# Patient Record
Sex: Female | Born: 1980 | Race: Black or African American | Hispanic: No | Marital: Married | Smoking: Never smoker
Health system: Southern US, Community
[De-identification: ages and names within clinical notes are randomized; demographics above are authoritative.]

## PROBLEM LIST (undated history)

## (undated) DIAGNOSIS — K219 Gastro-esophageal reflux disease without esophagitis: Secondary | ICD-10-CM

## (undated) DIAGNOSIS — M069 Rheumatoid arthritis, unspecified: Secondary | ICD-10-CM

## (undated) DIAGNOSIS — E049 Nontoxic goiter, unspecified: Secondary | ICD-10-CM

## (undated) DIAGNOSIS — I10 Essential (primary) hypertension: Secondary | ICD-10-CM

## (undated) DIAGNOSIS — N62 Hypertrophy of breast: Secondary | ICD-10-CM

## (undated) DIAGNOSIS — G4730 Sleep apnea, unspecified: Secondary | ICD-10-CM

## (undated) DIAGNOSIS — R42 Dizziness and giddiness: Secondary | ICD-10-CM

## (undated) HISTORY — DX: Dizziness and giddiness: R42

## (undated) HISTORY — DX: Gastro-esophageal reflux disease without esophagitis: K21.9

## (undated) HISTORY — DX: Rheumatoid arthritis, unspecified: M06.9

## (undated) SURGERY — BREAST REDUCTION WITH LIPOSUCTION
Anesthesia: General | Laterality: Bilateral

---

## 2002-10-21 ENCOUNTER — Encounter: Admission: RE | Admit: 2002-10-21 | Discharge: 2002-10-21 | Payer: Self-pay | Admitting: *Deleted

## 2002-10-21 ENCOUNTER — Encounter (INDEPENDENT_AMBULATORY_CARE_PROVIDER_SITE_OTHER): Payer: Self-pay

## 2002-10-21 ENCOUNTER — Other Ambulatory Visit: Admission: RE | Admit: 2002-10-21 | Discharge: 2002-10-21 | Payer: Self-pay | Admitting: *Deleted

## 2002-12-16 ENCOUNTER — Encounter: Admission: RE | Admit: 2002-12-16 | Discharge: 2002-12-16 | Payer: Self-pay | Admitting: Obstetrics and Gynecology

## 2003-04-06 ENCOUNTER — Encounter (INDEPENDENT_AMBULATORY_CARE_PROVIDER_SITE_OTHER): Payer: Self-pay | Admitting: *Deleted

## 2003-04-06 ENCOUNTER — Encounter: Admission: RE | Admit: 2003-04-06 | Discharge: 2003-04-06 | Payer: Self-pay | Admitting: Obstetrics and Gynecology

## 2003-08-03 ENCOUNTER — Encounter: Admission: RE | Admit: 2003-08-03 | Discharge: 2003-08-03 | Payer: Self-pay | Admitting: Obstetrics and Gynecology

## 2003-08-03 ENCOUNTER — Encounter (INDEPENDENT_AMBULATORY_CARE_PROVIDER_SITE_OTHER): Payer: Self-pay | Admitting: Specialist

## 2003-12-20 ENCOUNTER — Encounter: Admission: RE | Admit: 2003-12-20 | Discharge: 2003-12-20 | Payer: Self-pay | Admitting: Internal Medicine

## 2003-12-22 ENCOUNTER — Encounter: Admission: RE | Admit: 2003-12-22 | Discharge: 2003-12-22 | Payer: Self-pay | Admitting: Family Medicine

## 2004-02-08 ENCOUNTER — Encounter: Admission: RE | Admit: 2004-02-08 | Discharge: 2004-02-08 | Payer: Self-pay | Admitting: Obstetrics and Gynecology

## 2004-02-08 ENCOUNTER — Encounter (INDEPENDENT_AMBULATORY_CARE_PROVIDER_SITE_OTHER): Payer: Self-pay | Admitting: *Deleted

## 2004-09-24 ENCOUNTER — Encounter (INDEPENDENT_AMBULATORY_CARE_PROVIDER_SITE_OTHER): Payer: Self-pay | Admitting: *Deleted

## 2004-09-24 ENCOUNTER — Ambulatory Visit: Payer: Self-pay | Admitting: Obstetrics & Gynecology

## 2004-10-02 ENCOUNTER — Encounter: Admission: RE | Admit: 2004-10-02 | Discharge: 2004-10-02 | Payer: Self-pay | Admitting: Internal Medicine

## 2005-02-25 ENCOUNTER — Encounter (INDEPENDENT_AMBULATORY_CARE_PROVIDER_SITE_OTHER): Payer: Self-pay | Admitting: *Deleted

## 2005-02-25 ENCOUNTER — Ambulatory Visit: Payer: Self-pay | Admitting: Obstetrics & Gynecology

## 2005-10-01 ENCOUNTER — Encounter: Admission: RE | Admit: 2005-10-01 | Discharge: 2005-10-01 | Payer: Self-pay | Admitting: Rheumatology

## 2005-12-10 ENCOUNTER — Ambulatory Visit: Payer: Self-pay | Admitting: Obstetrics & Gynecology

## 2005-12-10 ENCOUNTER — Encounter (INDEPENDENT_AMBULATORY_CARE_PROVIDER_SITE_OTHER): Payer: Self-pay | Admitting: Specialist

## 2006-05-20 ENCOUNTER — Ambulatory Visit: Payer: Self-pay | Admitting: Obstetrics & Gynecology

## 2006-05-20 ENCOUNTER — Encounter (INDEPENDENT_AMBULATORY_CARE_PROVIDER_SITE_OTHER): Payer: Self-pay | Admitting: *Deleted

## 2006-06-04 ENCOUNTER — Ambulatory Visit: Payer: Self-pay | Admitting: Obstetrics & Gynecology

## 2006-06-10 IMAGING — CR DG CHEST 2V
2 series · 2 of 2 positions shown · non-contrast
Comparison: none

CLINICAL DATA: Cough.  Evaluate for possible sarcoidosis. 
 CHEST ? TWO VIEWS:

[view not recorded (1 of 2)]
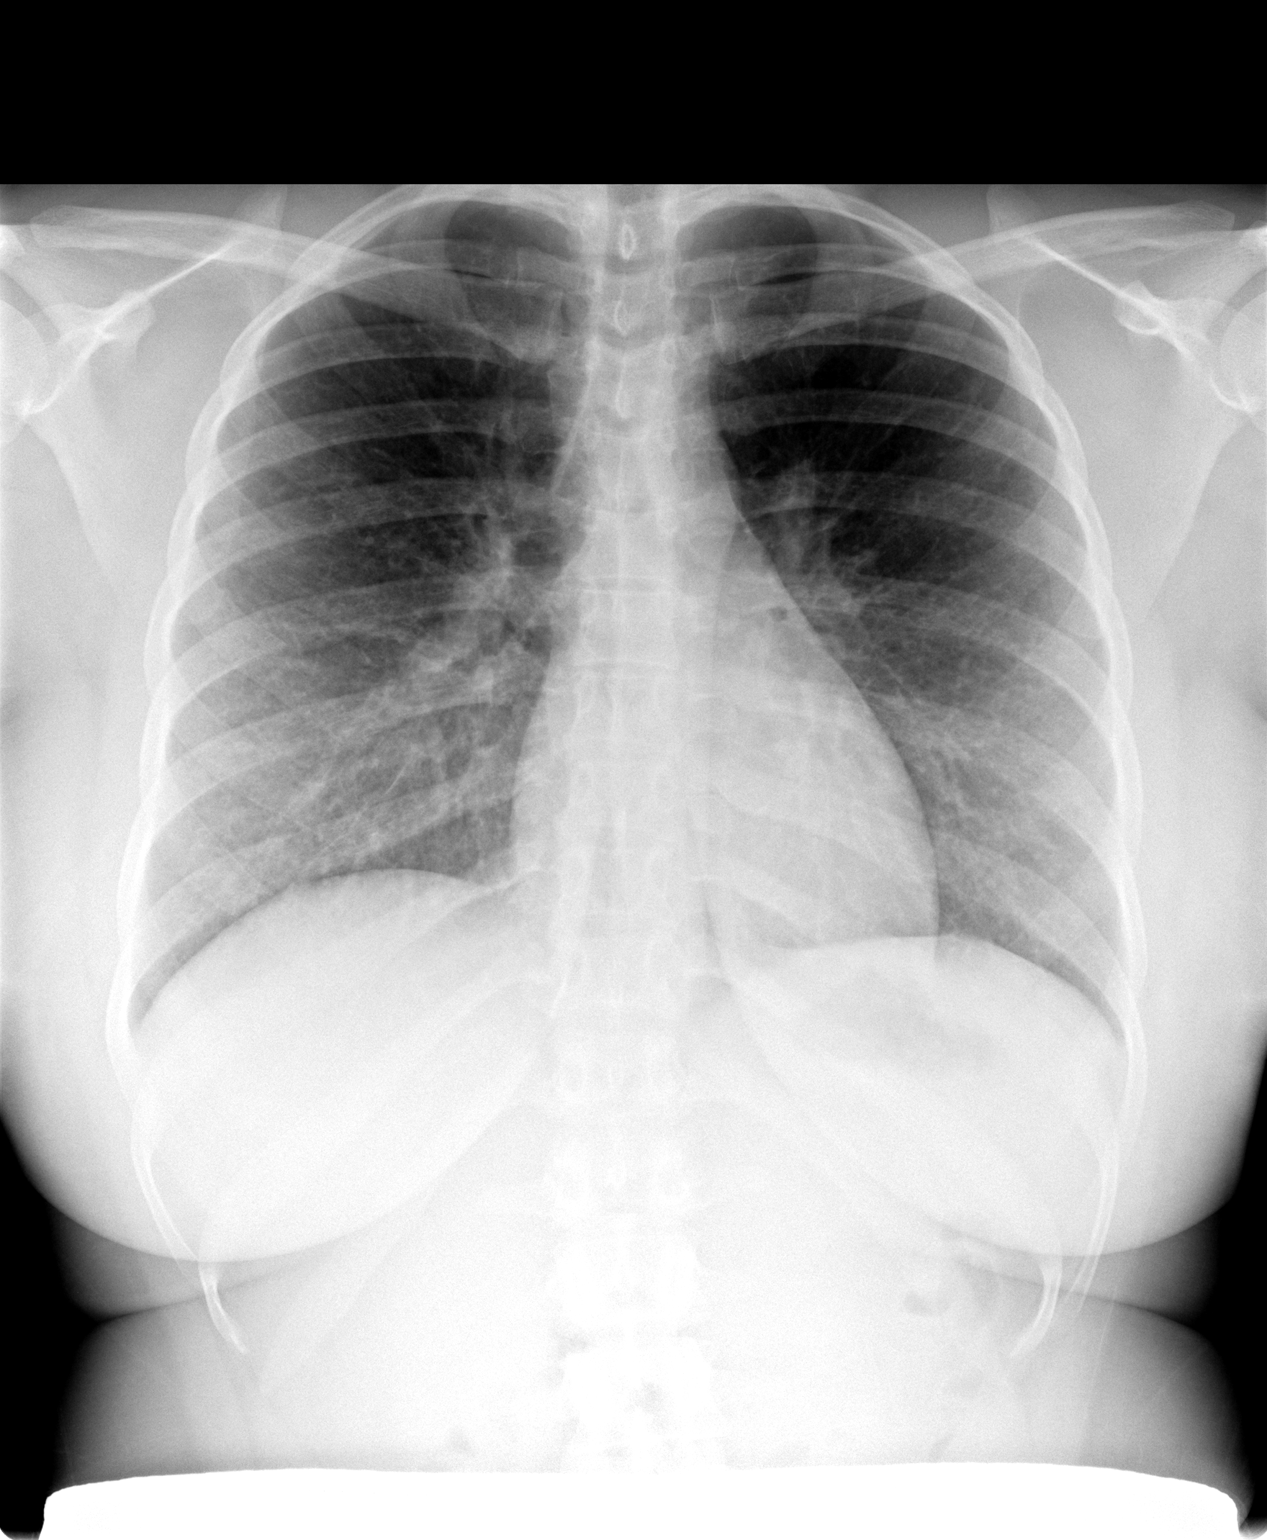

[view not recorded (2 of 2)]
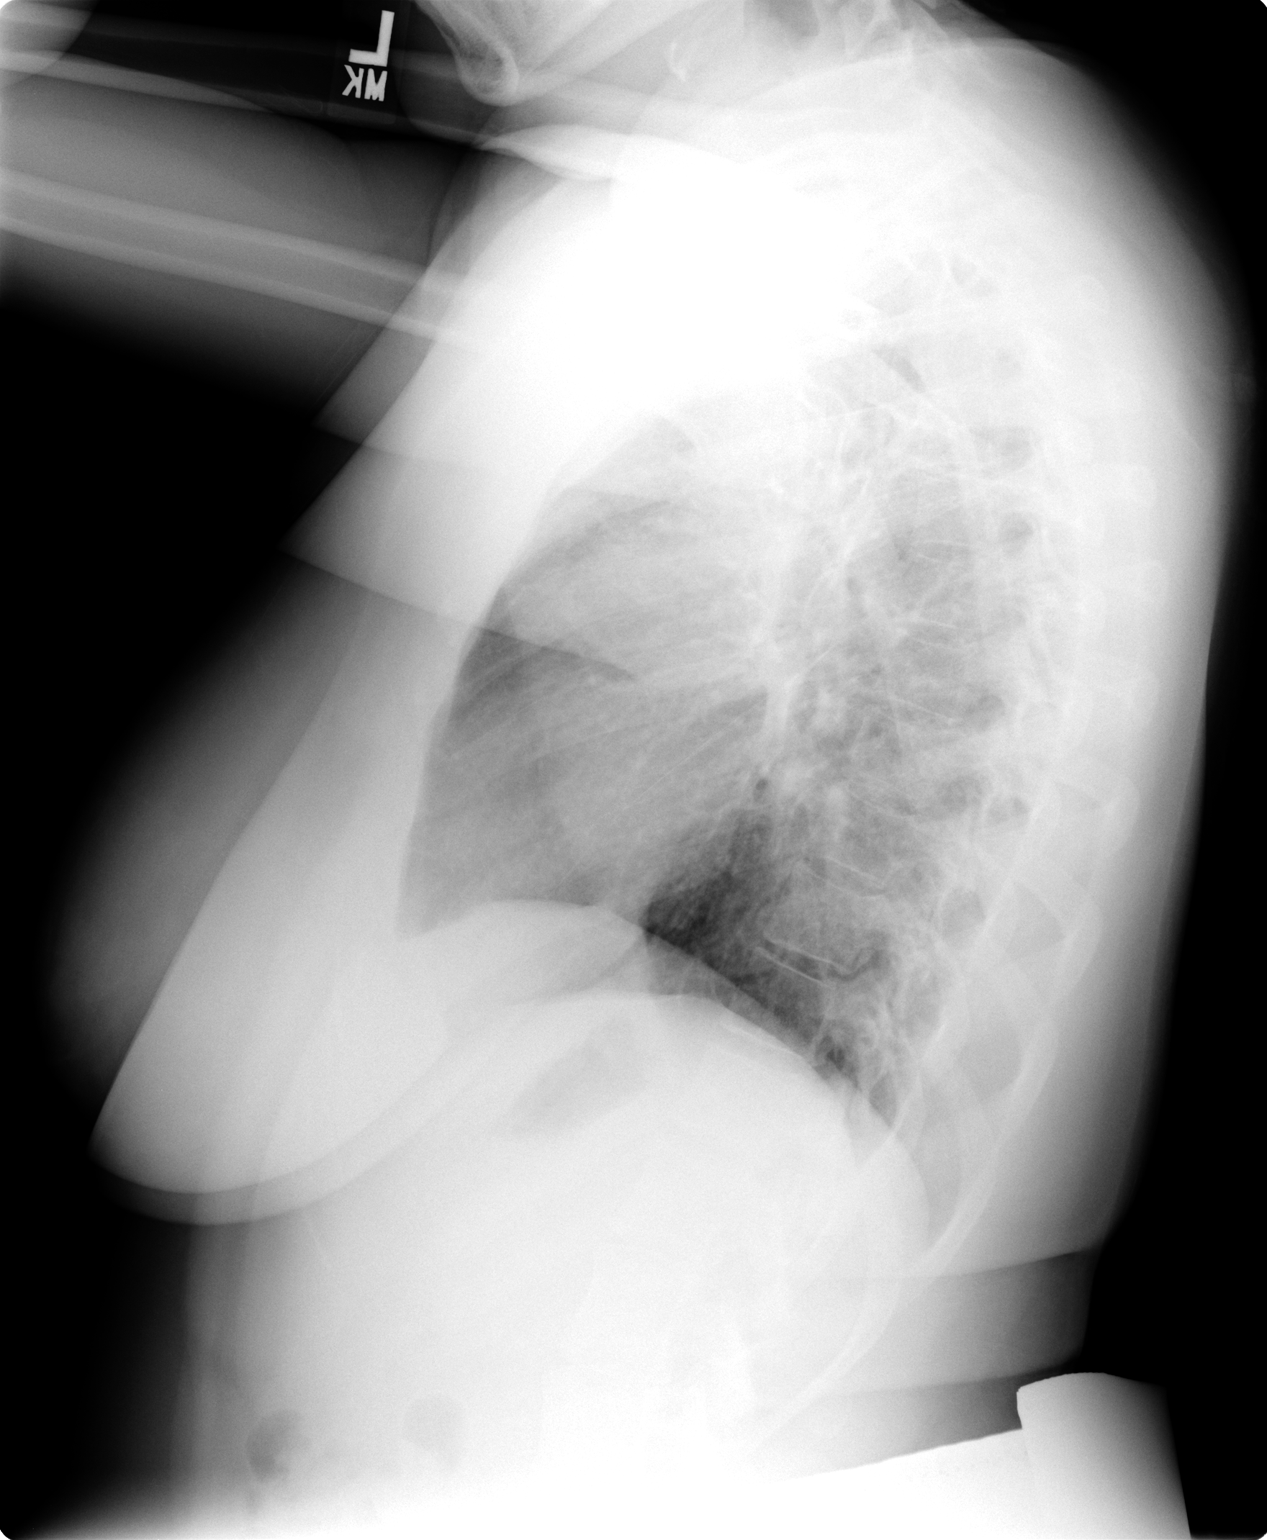

[2 of 2 positions shown; findings below may reference images not displayed]

FINDINGS: Two views of the chest show no active infiltrate or effusion.  No adenopathy is seen.  Slightly prominent perihilar markings are noted with mild interstitial prominence possibly chronic.  Compare with prior or follow-up chest x-ray.  The heart is within normal limits in size.  No bony abnormality is seen.
IMPRESSION: Slightly prominent interstitial markings.  No adenopathy.  Compare with prior or follow-up chest x-ray.

## 2006-09-17 ENCOUNTER — Ambulatory Visit: Payer: Self-pay | Admitting: Obstetrics & Gynecology

## 2006-09-17 ENCOUNTER — Encounter (INDEPENDENT_AMBULATORY_CARE_PROVIDER_SITE_OTHER): Payer: Self-pay | Admitting: Specialist

## 2006-11-04 ENCOUNTER — Ambulatory Visit: Payer: Self-pay | Admitting: Obstetrics & Gynecology

## 2006-11-04 ENCOUNTER — Other Ambulatory Visit: Admission: RE | Admit: 2006-11-04 | Discharge: 2006-11-04 | Payer: Self-pay | Admitting: Obstetrics and Gynecology

## 2006-11-18 ENCOUNTER — Ambulatory Visit: Payer: Self-pay | Admitting: Obstetrics and Gynecology

## 2007-06-30 ENCOUNTER — Ambulatory Visit: Payer: Self-pay | Admitting: Obstetrics & Gynecology

## 2007-06-30 ENCOUNTER — Encounter: Payer: Self-pay | Admitting: Obstetrics & Gynecology

## 2008-07-27 ENCOUNTER — Other Ambulatory Visit: Admission: RE | Admit: 2008-07-27 | Discharge: 2008-07-27 | Payer: Self-pay | Admitting: Obstetrics and Gynecology

## 2009-08-28 ENCOUNTER — Other Ambulatory Visit: Admission: RE | Admit: 2009-08-28 | Discharge: 2009-08-28 | Payer: Self-pay | Admitting: Obstetrics and Gynecology

## 2010-09-09 ENCOUNTER — Ambulatory Visit (HOSPITAL_COMMUNITY)
Admission: RE | Admit: 2010-09-09 | Discharge: 2010-09-09 | Payer: Self-pay | Source: Home / Self Care | Attending: Obstetrics and Gynecology | Admitting: Obstetrics and Gynecology

## 2010-09-16 ENCOUNTER — Inpatient Hospital Stay (HOSPITAL_COMMUNITY)
Admission: AD | Admit: 2010-09-16 | Discharge: 2010-09-16 | Payer: Self-pay | Source: Home / Self Care | Attending: Obstetrics and Gynecology | Admitting: Obstetrics and Gynecology

## 2010-09-18 ENCOUNTER — Inpatient Hospital Stay (HOSPITAL_COMMUNITY)
Admission: AD | Admit: 2010-09-18 | Discharge: 2010-09-19 | Payer: Self-pay | Source: Home / Self Care | Attending: Obstetrics and Gynecology | Admitting: Obstetrics and Gynecology

## 2010-09-19 ENCOUNTER — Encounter (INDEPENDENT_AMBULATORY_CARE_PROVIDER_SITE_OTHER): Payer: Self-pay | Admitting: Obstetrics and Gynecology

## 2010-09-19 HISTORY — PX: LAPAROSCOPIC UNILATERAL SALPINGECTOMY: SHX5934

## 2010-10-13 ENCOUNTER — Encounter: Payer: Self-pay | Admitting: Internal Medicine

## 2010-11-17 NOTE — Discharge Summary (Signed)
  NAMEJANISSA, [name] NO.:  [id number]  MEDICAL RECORD NO.:  [id number]          PATIENT TYPE:  INP  LOCATION:  9307                          [facility]ACILITY:  [facility]  PHYSICIAN:  Gerald Leitz, MD          DATE OF BIRTH:  11/23/1980  DATE OF ADMISSION:  09/18/2010 DATE OF DISCHARGE:  09/19/2010                              DISCHARGE SUMMARY   ADMISSION DIAGNOSES: 1. Ectopic pregnancy. 2. Rheumatoid arthritis.  DISCHARGE DIAGNOSES: 1. Ectopic pregnancy. 2. Rheumatoid arthritis. 3. Status post laparoscopic right salpingectomy.  BRIEF HOSPITAL COURSE:  The patient was admitted on September 18, 2010, in an observation status and underwent laparoscopic right salpingostomy for ectopic pregnancy.  On December 29, she was discharged home, the day of surgery in improved and stable condition.  She will follow up in 2 weeks in the office.  She is discharged home on the following medications, Motrin and Percocet.     Gerald Leitz, MD   TC/MEDQ  D:  10/30/2010  T:  10/30/2010  Job:  [id number]  Electronically Signed by Gerald Leitz MD on 11/17/2010 11:11:03 AM

## 2010-12-02 LAB — BUN: BUN: 7 mg/dL (ref 6–23)

## 2010-12-02 LAB — CREATININE, SERUM
Creatinine, Ser: 0.65 mg/dL (ref 0.4–1.2)
GFR calc non Af Amer: >60 mL/min (ref >60)

## 2010-12-02 LAB — CBC
HCT: 34.2 % — ABNORMAL LOW (ref 36.0–46.0)
HCT: 36.4 % (ref 36.0–46.0)
MCHC: 34.2 g/dL (ref 30.0–36.0)
MCV: 87.7 fL (ref 78.0–100.0)
Platelets: 350 10*3/uL (ref 150–400)
Platelets: 365 10*3/uL (ref 150–400)
RBC: 4.15 MIL/uL (ref 3.87–5.11)
RDW: 13.4 % (ref 11.5–15.5)
WBC: 5.4 10*3/uL (ref 4.0–10.5)
WBC: 7.6 10*3/uL (ref 4.0–10.5)

## 2010-12-02 LAB — DIFFERENTIAL
Basophils Absolute: 0 10*3/uL (ref 0.0–0.1)
Eosinophils Absolute: 0.1 10*3/uL (ref 0.0–0.7)
Lymphs Abs: 1.9 10*3/uL (ref 0.7–4.0)
Neutrophils Relative %: 59 % (ref 43–77)

## 2010-12-02 LAB — URINALYSIS, ROUTINE W REFLEX MICROSCOPIC
Bilirubin Urine: NEGATIVE
Glucose, UA: NEGATIVE mg/dL
Ketones, ur: NEGATIVE mg/dL
Nitrite: NEGATIVE
Specific Gravity, Urine: 1.02 (ref 1.005–1.030)
pH: 7.5 (ref 5.0–8.0)

## 2010-12-02 LAB — URINE MICROSCOPIC-ADD ON

## 2010-12-02 LAB — COMPREHENSIVE METABOLIC PANEL
ALT: 21 U/L (ref 0–35)
AST: 22 U/L (ref 0–37)
Albumin: 3.7 g/dL (ref 3.5–5.2)
Alkaline Phosphatase: 56 U/L (ref 39–117)
Calcium: 9.2 mg/dL (ref 8.4–10.5)
GFR calc Af Amer: >60 mL/min (ref >60)
Glucose, Bld: 109 mg/dL — ABNORMAL HIGH (ref 70–99)
Potassium: 3.7 mEq/L (ref 3.5–5.1)
Sodium: 135 mEq/L (ref 135–145)
Total Protein: 7.2 g/dL (ref 6.0–8.3)

## 2010-12-02 LAB — HCG, QUANTITATIVE, PREGNANCY: hCG, Beta Chain, Quant, S: 5075 m[IU]/mL — ABNORMAL HIGH (ref <5)

## 2010-12-04 ENCOUNTER — Other Ambulatory Visit (HOSPITAL_COMMUNITY)
Admission: RE | Admit: 2010-12-04 | Payer: BC Managed Care – PPO | Source: Ambulatory Visit | Admitting: Obstetrics and Gynecology

## 2010-12-04 ENCOUNTER — Other Ambulatory Visit (HOSPITAL_COMMUNITY)
Admission: RE | Admit: 2010-12-04 | Discharge: 2010-12-04 | Disposition: A | Discharge status: Home / Self Care | Payer: BC Managed Care – PPO | Source: Ambulatory Visit | Attending: Obstetrics and Gynecology | Admitting: Obstetrics and Gynecology

## 2010-12-04 ENCOUNTER — Other Ambulatory Visit: Payer: Self-pay | Admitting: Obstetrics and Gynecology

## 2010-12-04 DIAGNOSIS — Z01419 Encounter for gynecological examination (general) (routine) without abnormal findings: Secondary | ICD-10-CM | POA: Insufficient documentation

## 2011-02-04 NOTE — Group Therapy Note (Signed)
NAME:  Marie [name], DEMARINIS NO.:  [id number]   MEDICAL RECORD NO.:  [id number]          PATIENT TYPE:  WOC   LOCATION:  [facility]                   FACILITY:  [facility]   PHYSICIAN:  Elsie Lincoln, MD      DATE OF BIRTH:  11-08-1980   DATE OF SERVICE:  06/30/2007                                  CLINIC NOTE   HISTORY:  The patient is a 30 year old female who presents for her  annual exam.  She has been followed in our clinic for many years for  abnormal Pap smears.  She has had a cervical dysplasia in the past that  has been treated with a LEEP.  However, since 2004 she has been had low  grade and has been tested negative for the virus at least twice.  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  She was also negative for the virus a year ago.  Given that she has been negative for the virus for several years, I do  not believe that she needs more than once yearly cytology, unless she  develops high grade, I think, that she is trying to clear low-risk virus  at this time.   The patient understands the importance of following up.  She has not  kept appointments in the past which has spread out some of her testing  yearly, anyway.  The patient also requests a STD screening, although she  does say she does use condoms.  She has used Ortho Evra in the past, but  just stopped; and she would like to try Loestrin FE 24 which will start  after her next period.   PAST MEDICAL HISTORY:  Rheumatoid arthritis.   PAST SURGICAL HISTORY:  No change.   PAST GYN HISTORY:  As above and no other changes.   MEDICATIONS:  Nexium.   REVIEW OF SYSTEMS:  No problems.   PHYSICAL EXAM:  VITAL SIGNS:  Temperature 98.4, pulse 60, blood pressure  125/87, weight 181.9, respiratory rate 22.  GENERAL:  Well-nourished, well-developed in no apparent distress.  HEENT:  Normocephalic, atraumatic.  NECK:  Thyroid no masses.  LUNGS:  Clear to  auscultation bilaterally.  HEART:  Regular rate and rhythm.  BREASTS:  No masses, no lymphadenopathy.  ABDOMEN:  Soft, nontender.  No organomegaly.  No hernia.  PELVIC:  Genitalia Tanner 5 with evidence of two resolving ingrown  hairs.  Vagina pink normal rugae.  Cervix closed nontender with evidence  of a LEEP.  Uterus nontender adnexa no masses, nontender.  EXTREMITIES:  Nontender.  No edema.   ASSESSMENT:  A 30 year old female with low-grade Pap smears for several  years with negative high-risk virus.   PLAN:  1. If Pap smear is normal, she can go to yearly screenings.  If Pap      smear is abnormal, we will have her come back in 6 months.  If high-      grade, will repeat colposcopy.  2. Change birth control to Loestrin FE 24.  3. STD screening today.  ______________________________  Elsie Lincoln, MD     KL/MEDQ  D:  06/30/2007  T:  07/01/2007  Job:  [id number]

## 2011-02-07 NOTE — Group Therapy Note (Signed)
   NAME:  EMYA, PICADO NO.:  [id number]   MEDICAL RECORD NO.:  [id number]                   PATIENT TYPE:  OUT   LOCATION:  [facility]                           FACILITY:  [facility]   PHYSICIAN:  Ellis Parents, MD                 DATE OF BIRTH:  July 31, 1981   DATE OF SERVICE:  08/03/2003                                    CLINIC NOTE   SUMMARY:  This 30 year old nulliparous patient returns for follow-up Pap  smear.  Her dysplasia history is summarized as follows, chronologically:  1. Cytology:  ASCUS, December 2002.  2. Cytology:  ASCUS - high, October 2003.  3. Cytology:  LSIL and HPV, December 2003.  4. Colposcopy with biopsy, December 2003:  CIN 1-2.  5. LEEP, January 2004:  CIN 2.  6. Cytology, July 2004:  CIN 1 with HPV.   Today a repeat cytology was performed with HPV typing requested.  The  patient is to return in five months unless cytology shows CIN 3.                                               Ellis Parents, MD    SA/MEDQ  D:  08/03/2003  T:  08/03/2003  Job:  [id number]

## 2011-02-07 NOTE — Group Therapy Note (Signed)
NAME:  Marie [name], Marie [name] NO.:  [id number]   MEDICAL RECORD NO.:  [id number]          PATIENT TYPE:  WOC   LOCATION:  [facility]                   FACILITY:  [facility]   PHYSICIAN:  Elsie Lincoln, MD      DATE OF BIRTH:  June 26, 1981   DATE OF SERVICE:                                    CLINIC NOTE   HISTORY OF PRESENT ILLNESS:  Patient is a 30 year old female who presents  for repeat Pap smear.  The patient had a LEEP back in 2004 for high grade,  and has had multiple Pap smears, some low grade and some normal.  Her last  Pap smear that was abnormal was January 2006.  She had a normal Pap smear in  June of 2006, and she now presents for a Pap smear today.  If this one is  normal, she will need one more at 6 months and then go to yearly.  She has  had a new diagnosis of inflammatory arthritis since her last exam.  She is  on naproxen.  She does not have a diagnosis of lupus.  She sees a  rheumatologist for this.  The rheumatologist knows she is on oral  contraceptives that contain estrogen.  The patient only has a complaint that  when the Ortho patch comes off she gets the beginnings of a yeast infection  with discharge, and it is currently happening so we will do a wet prep.   PHYSICAL EXAMINATION:  GENERAL:  Well nourished and well developed in no  apparent distress.  ABDOMEN:  Soft, nontender, nondistended.  No rebound.  No guarding.  No  hernia.  VITAL SIGNS:  Temperature 98.5, pulse 77, blood pressure 128/82, weight  159.6.  GENITOURINARY:  Genitalia Tanner 5.  Vagina pink with a small amount of  discharge.  Normal rugae.  Cervix closed, nontender.  The uterus is  anteverted, small, nontender, mobile.  Adnexa:  No masses.  Nontender.   ASSESSMENT:  A 30 year old female here for repeat Pap smear.   PLAN:  1.  Pap smear today.  2.  GC and chlamydia.  3.  Wet prep.  4.  Return to clinic in 6 months for Pap smear and then go to yearly if      normal.     ______________________________  Elsie Lincoln, MD     KL/MEDQ  D:  12/10/2005  T:  12/10/2005  Job:  [id number]

## 2011-02-07 NOTE — Group Therapy Note (Signed)
NAME:  Marie [name], Marie [name] NO.:  [id number]   MEDICAL RECORD NO.:  [id number]          PATIENT TYPE:  WOC   LOCATION:  [facility]                   [facility]ACILITY:  [facility]   PHYSICIAN:  Dorthula Perfect, MD     DATE OF BIRTH:  September 08, 1981   DATE OF SERVICE:  09/17/2006                                  CLINIC NOTE   A 30 year old black female, gravida zero, last menstrual period December  9, using Montez Hageman for birth control, returns for 3 month Pap smear.  She had colposcopy and biopsy in August with pathology showing low-grade  dysplasia (CIN-I).  Her August Pap smear had shown CIN-I.  A Pap smear  March 2007 had shown CIN-I.  Pap smear June 2006 was negative.  Pap  smear January 2006 CIN-I.  In 2004 the patient had a LEEP performed for  high-grade disease.   EXAMINATION:  ABDOMEN:  Slightly obese, soft, nontender, no masses.  PELVIC:  External genitalia, BS glands are normal, vaginal vault  epithelialized as was the cervix. The uterus is in midline of normal  size and shape.  Adnexa structures were normal.  Pap smear was taken  with a spatula and Cytobrush.  Cytobrush stimulated a little bit of  bleeding.   DIAGNOSIS:  Cervical dysplasia, cervical intraepithelial neoplasia I.   DISPOSITION:  1. Pap smear.  2. Return in 3 months for Pap smear unless this report gives reason to      have her back sooner.           ______________________________  Dorthula Perfect, MD     ER/MEDQ  D:  09/17/2006  T:  09/17/2006  Job:  [id number]

## 2011-03-17 ENCOUNTER — Other Ambulatory Visit (HOSPITAL_COMMUNITY): Payer: Self-pay | Admitting: Obstetrics and Gynecology

## 2011-03-17 DIAGNOSIS — N979 Female infertility, unspecified: Secondary | ICD-10-CM

## 2011-03-21 ENCOUNTER — Ambulatory Visit (HOSPITAL_COMMUNITY)
Admission: RE | Admit: 2011-03-21 | Discharge: 2011-03-21 | Disposition: A | Discharge status: Home / Self Care | Payer: BC Managed Care – PPO | Source: Ambulatory Visit | Attending: Obstetrics and Gynecology | Admitting: Obstetrics and Gynecology

## 2011-03-21 DIAGNOSIS — N979 Female infertility, unspecified: Secondary | ICD-10-CM | POA: Insufficient documentation

## 2011-03-21 MED ORDER — IOHEXOL 300 MG/ML  SOLN: 20.0000 mL | Freq: Once | INTRAMUSCULAR | Status: AC | PRN

## 2011-05-30 HISTORY — PX: LAPAROSCOPIC LYSIS OF ADHESIONS: SHX5905

## 2011-05-30 HISTORY — PX: CHROMOPERTUBATION: SHX6288

## 2011-05-30 HISTORY — PX: HYSTEROSCOPY DIAGNOSTIC: PRO49

## 2011-09-18 ENCOUNTER — Other Ambulatory Visit (HOSPITAL_COMMUNITY): Payer: Self-pay | Admitting: Obstetrics and Gynecology

## 2011-09-18 DIAGNOSIS — O3680X0 Pregnancy with inconclusive fetal viability, not applicable or unspecified: Secondary | ICD-10-CM

## 2011-09-22 ENCOUNTER — Other Ambulatory Visit (HOSPITAL_COMMUNITY): Payer: Self-pay | Admitting: Obstetrics and Gynecology

## 2011-09-22 ENCOUNTER — Ambulatory Visit (HOSPITAL_COMMUNITY)
Admission: RE | Admit: 2011-09-22 | Discharge: 2011-09-22 | Disposition: A | Discharge status: Home / Self Care | Payer: BC Managed Care – PPO | Source: Ambulatory Visit | Attending: Obstetrics and Gynecology | Admitting: Obstetrics and Gynecology

## 2011-09-22 DIAGNOSIS — O3680X0 Pregnancy with inconclusive fetal viability, not applicable or unspecified: Secondary | ICD-10-CM

## 2011-09-22 DIAGNOSIS — Z369 Encounter for antenatal screening, unspecified: Secondary | ICD-10-CM

## 2011-09-25 ENCOUNTER — Encounter (HOSPITAL_COMMUNITY): Payer: Self-pay | Admitting: *Deleted

## 2011-09-25 ENCOUNTER — Inpatient Hospital Stay (HOSPITAL_COMMUNITY)
Admission: AD | Admit: 2011-09-25 | Discharge: 2011-09-26 | Disposition: A | Discharge status: Home / Self Care | Payer: BC Managed Care – PPO | Source: Ambulatory Visit | Attending: Obstetrics and Gynecology | Admitting: Obstetrics and Gynecology

## 2011-09-25 DIAGNOSIS — O209 Hemorrhage in early pregnancy, unspecified: Secondary | ICD-10-CM | POA: Insufficient documentation

## 2011-09-25 DIAGNOSIS — N930 Postcoital and contact bleeding: Secondary | ICD-10-CM

## 2011-09-25 DIAGNOSIS — O3440 Maternal care for other abnormalities of cervix, unspecified trimester: Secondary | ICD-10-CM | POA: Insufficient documentation

## 2011-09-25 NOTE — ED Provider Notes (Signed)
History     No chief complaint on file.  HPI 31 y.o. G2P0010 at [redacted]w[redacted]d with painless vaginal bleeding starting tonight, had "gush" earlier tonight, has slowed since. Intercourse yesterday night. Was told that she had a cervical polyp at last pelvic exam 2 weeks ago and pelvic rest was recommended. Had u/s for integrated screen yesterday, was told that "everything looked good".     Past Medical History  Diagnosis Date  . No pertinent past medical history     Past Surgical History  Procedure Date  . Laparscopy     Family History  Problem Relation Age of Onset  . Hypertension Mother     History  Substance Use Topics  . Smoking status: Never Smoker   . Smokeless tobacco: Not on file  . Alcohol Use: No    Allergies:  Allergies  Allergen Reactions  . Shellfish Allergy Anaphylaxis, Hives and Swelling    Tongue swelling   . Latex Rash  . Red Dye Hives and Rash    No. 5 only  . Sulfa Antibiotics Rash    Prescriptions prior to admission  Medication Sig Dispense Refill  . Prenatal Vit-Fe Fumarate-FA (PRENATAL MULTIVITAMIN) TABS Take 1 tablet by mouth daily.        . ranitidine (ZANTAC) 150 MG tablet Take 150 mg by mouth daily as needed. For heartburn         Review of Systems  Constitutional: Negative.   Respiratory: Negative.   Cardiovascular: Negative.   Gastrointestinal: Negative for nausea, vomiting, abdominal pain, diarrhea and constipation.  Genitourinary: Negative for dysuria, urgency, frequency, hematuria and flank pain.       Negative cramping/contractions, Positive  for vaginal bleeding  Musculoskeletal: Negative.   Neurological: Negative.   Psychiatric/Behavioral: Negative.    Physical Exam   Blood pressure 133/65, pulse 83, temperature 98.8 F (37.1 C), temperature source Oral, resp. rate 20, height 5\' 2"  (1.575 m), weight 197 lb 8 oz (89.585 kg), last menstrual period 03/17/2011.  Physical Exam  Nursing note and vitals reviewed. Constitutional: She  is oriented to person, place, and time. She appears well-developed and well-nourished. No distress.  Cardiovascular: Normal rate.   Respiratory: Effort normal.  GI: Soft. She exhibits no distension. There is no tenderness.  Genitourinary: There is bleeding (small) around the vagina.    Musculoskeletal: Normal range of motion.  Neurological: She is alert and oriented to person, place, and time.  Skin: Skin is warm and dry.  Psychiatric: She has a normal mood and affect.    MAU Course  Procedures  Bedside u/s: + FHR, + fetal movement  Assessment and Plan  30 y.o. G2P0010 at [redacted]w[redacted]d Postcoital bleeding, cervical polyp F/U in office in 1 week or sooner PRN  Margy Sumler 09/25/2011, 11:53 PM

## 2011-09-25 NOTE — Progress Notes (Signed)
Pt states started having vaginal bleeding that started at 2130.Pt states she had a gush of blood early. Pt states she still see blood in her urine heavy.

## 2011-09-25 NOTE — Progress Notes (Signed)
Pt states she has had bleeding in the past , spotting. This bleeding she has today is much more than previous

## 2011-09-25 NOTE — Progress Notes (Signed)
PT SAYS HAD HX-  ECTOPIC- METHOTREXATE  X2- DID NOT WORK- SO R TUBE REMOVED.  BUT TONIGHT  SHE WAS SITTING IN BED-  FELT GUSH OF BLOOD CAME OUT- NO PAIN. HAS PAD ON-    HAS RED SMEARS .  NO SEX TONIGHT

## 2011-09-26 ENCOUNTER — Encounter (HOSPITAL_COMMUNITY): Payer: Self-pay | Admitting: Advanced Practice Midwife

## 2011-09-30 NOTE — ED Provider Notes (Signed)
Discussed with provider.  Checked out to Dr. Richardson Dopp the following morning.

## 2012-05-18 ENCOUNTER — Other Ambulatory Visit (HOSPITAL_COMMUNITY)
Admission: RE | Admit: 2012-05-18 | Discharge: 2012-05-18 | Disposition: A | Discharge status: Home / Self Care | Payer: BC Managed Care – PPO | Source: Ambulatory Visit | Attending: Obstetrics and Gynecology | Admitting: Obstetrics and Gynecology

## 2012-05-18 ENCOUNTER — Other Ambulatory Visit: Payer: Self-pay | Admitting: Obstetrics and Gynecology

## 2012-05-18 DIAGNOSIS — Z01419 Encounter for gynecological examination (general) (routine) without abnormal findings: Secondary | ICD-10-CM | POA: Insufficient documentation

## 2013-05-20 ENCOUNTER — Other Ambulatory Visit (HOSPITAL_COMMUNITY)
Admission: RE | Admit: 2013-05-20 | Discharge: 2013-05-20 | Disposition: A | Discharge status: Home / Self Care | Payer: BC Managed Care – PPO | Source: Ambulatory Visit | Attending: Obstetrics and Gynecology | Admitting: Obstetrics and Gynecology

## 2013-05-20 ENCOUNTER — Other Ambulatory Visit: Payer: Self-pay | Admitting: Obstetrics and Gynecology

## 2013-05-20 DIAGNOSIS — Z01419 Encounter for gynecological examination (general) (routine) without abnormal findings: Secondary | ICD-10-CM | POA: Insufficient documentation

## 2013-05-20 DIAGNOSIS — Z1151 Encounter for screening for human papillomavirus (HPV): Secondary | ICD-10-CM | POA: Insufficient documentation

## 2014-03-22 DIAGNOSIS — R42 Dizziness and giddiness: Secondary | ICD-10-CM

## 2014-03-22 HISTORY — DX: Dizziness and giddiness: R42

## 2014-05-09 ENCOUNTER — Other Ambulatory Visit: Payer: Self-pay | Admitting: Obstetrics and Gynecology

## 2014-05-09 ENCOUNTER — Other Ambulatory Visit (HOSPITAL_COMMUNITY)
Admission: RE | Admit: 2014-05-09 | Discharge: 2014-05-09 | Disposition: A | Discharge status: Home / Self Care | Payer: BC Managed Care – PPO | Source: Ambulatory Visit | Attending: Obstetrics and Gynecology | Admitting: Obstetrics and Gynecology

## 2014-05-09 DIAGNOSIS — Z01419 Encounter for gynecological examination (general) (routine) without abnormal findings: Secondary | ICD-10-CM | POA: Diagnosis present

## 2014-05-10 LAB — CYTOLOGY - PAP

## 2014-06-24 ENCOUNTER — Inpatient Hospital Stay (HOSPITAL_COMMUNITY): Payer: BC Managed Care – PPO

## 2014-06-24 ENCOUNTER — Inpatient Hospital Stay (HOSPITAL_COMMUNITY)
Admission: AD | Admit: 2014-06-24 | Discharge: 2014-06-25 | Disposition: A | Discharge status: Home / Self Care | Payer: BC Managed Care – PPO | Source: Ambulatory Visit | Attending: Obstetrics and Gynecology | Admitting: Obstetrics and Gynecology

## 2014-06-24 ENCOUNTER — Encounter (HOSPITAL_COMMUNITY): Payer: Self-pay | Admitting: *Deleted

## 2014-06-24 DIAGNOSIS — O009 Unspecified ectopic pregnancy without intrauterine pregnancy: Secondary | ICD-10-CM

## 2014-06-24 DIAGNOSIS — N390 Urinary tract infection, site not specified: Secondary | ICD-10-CM

## 2014-06-24 DIAGNOSIS — O9989 Other specified diseases and conditions complicating pregnancy, childbirth and the puerperium: Secondary | ICD-10-CM | POA: Diagnosis not present

## 2014-06-24 DIAGNOSIS — N949 Unspecified condition associated with female genital organs and menstrual cycle: Secondary | ICD-10-CM | POA: Insufficient documentation

## 2014-06-24 LAB — WET PREP, GENITAL
CLUE CELLS WET PREP: NONE SEEN
Trich, Wet Prep: NONE SEEN
Yeast Wet Prep HPF POC: NONE SEEN

## 2014-06-24 LAB — URINALYSIS, ROUTINE W REFLEX MICROSCOPIC
BILIRUBIN URINE: NEGATIVE
GLUCOSE, UA: 100 mg/dL — AB
Ketones, ur: 15 mg/dL — AB
Leukocytes, UA: NEGATIVE
Nitrite: POSITIVE — AB
PROTEIN: 100 mg/dL — AB
Specific Gravity, Urine: 1.02 (ref 1.005–1.030)
Urobilinogen, UA: 1 mg/dL (ref 0.0–1.0)
pH: 6 (ref 5.0–8.0)

## 2014-06-24 LAB — URINE MICROSCOPIC-ADD ON

## 2014-06-24 LAB — HCG, QUANTITATIVE, PREGNANCY: hCG, Beta Chain, Quant, S: 2025 m[IU]/mL — ABNORMAL HIGH (ref <5)

## 2014-06-24 LAB — CBC
HCT: 37.7 % (ref 36.0–46.0)
HEMOGLOBIN: 12.9 g/dL (ref 12.0–15.0)
MCH: 30.3 pg (ref 26.0–34.0)
MCHC: 34.2 g/dL (ref 30.0–36.0)
MCV: 88.5 fL (ref 78.0–100.0)
PLATELETS: 275 10*3/uL (ref 150–400)
RBC: 4.26 MIL/uL (ref 3.87–5.11)
RDW: 13.7 % (ref 11.5–15.5)
WBC: 5.9 10*3/uL (ref 4.0–10.5)

## 2014-06-24 LAB — POCT PREGNANCY, URINE: PREG TEST UR: POSITIVE — AB

## 2014-06-24 NOTE — MAU Note (Signed)
PT   SAYS LMP-9-22.    DID NOT HAVE CYCLE JAN- July - USED CAMILA - BCP.  CYCLES IRREG.    THEN ON WED STARTED VAG BLEEDING AGAIN- WITH CRAMPS- TOOK TYLENOL.    TONIGHT  AT 645PM-  Marie [name]  HAD  BAD PAIN ON LEFT SIDE.   =- TOOK  2 XS TYLENOL  AT 7PM.     Marie [name] ONLY HAS LEFT TUBE.  RIGHT TUBE  HAD ECTOPIC-  2012.    HPT- DONE TONIGHT - POSITIVE.    IN ROOM- NO BLOOD ON PERINEUM-   BUT URINE CUP HAD BLOOD IN IT.    LESS CRAMPS NOW.   Marie [name] DID NOT CALL DR VARNADO.- LAST SEEN IN OFFICE ON 8-25.

## 2014-06-24 NOTE — MAU Provider Note (Signed)
History     CSN: [id number]  Arrival date and time: 06/24/14 2020   First Provider Initiated Contact with Patient 06/24/14 2122      No chief complaint on file.  HPI Comments: Marie [name] 33 y.o. [id number] at  [redacted]w[redacted]d  presents to MAU with left sided pelvic pains that started Wednesday. Her LMP was 06/13/14. She is seen by Dr Landry Mellow. She has had a ride sided tubal pregnancy and lost a right tube. Her pain was controlled by 2 tylenol. She is bleeding enough to wear pad.      Past Medical History  Diagnosis Date  . No pertinent past medical history     Past Surgical History  Procedure Laterality Date  . Laparscopy      Family History  Problem Relation Age of Onset  . Hypertension Mother     History  Substance Use Topics  . Smoking status: Never Smoker   . Smokeless tobacco: Not on file  . Alcohol Use: No    Allergies:  Allergies  Allergen Reactions  . Shellfish Allergy Anaphylaxis, Hives and Swelling    Tongue swelling   . Latex Rash  . Red Dye Hives and Rash    No. 5 only  . Sulfa Antibiotics Rash    Prescriptions prior to admission  Medication Sig Dispense Refill  . acetaminophen (TYLENOL) 500 MG tablet Take 1,000 mg by mouth every 6 (six) hours as needed for mild pain.      Marland Kitchen CAMILA 0.35 MG tablet Take 1 tablet by mouth daily.       Marland Kitchen esomeprazole (NEXIUM) 20 MG capsule Take 20 mg by mouth daily at 12 noon.        Review of Systems  Constitutional: Negative.   HENT: Negative.   Eyes: Negative.   Respiratory: Negative.   Cardiovascular: Negative.   Gastrointestinal: Negative.   Genitourinary:       Vaginal bleeding and left sided pelvic pain  Musculoskeletal: Negative.   Skin: Negative.   Neurological: Negative.   Endo/Heme/Allergies: Negative.   Psychiatric/Behavioral: Negative.    Physical Exam   Blood pressure 150/84, pulse 83, temperature 98.9 F (37.2 C), temperature source Oral, resp. rate 20, height 5\' 1"  (1.549 m), weight 100.925 kg (222  lb 8 oz), last menstrual period 06/13/2014.  Physical Exam  Constitutional: She is oriented to person, place, and time. She appears well-developed and well-nourished. No distress.  HENT:  Head: Normocephalic and atraumatic.  Eyes: Conjunctivae are normal. Pupils are equal, round, and reactive to light.  Cardiovascular: Normal rate, regular rhythm and normal heart sounds.   Respiratory: Effort normal and breath sounds normal.  GI: Soft. Bowel sounds are normal. She exhibits no distension. There is tenderness. There is no rebound.  Genitourinary:  Genital:external negative Vaginal:moderate amount blood Cervix:closed/ thick Bimanual:nontender   Musculoskeletal: Normal range of motion.  Neurological: She is alert and oriented to person, place, and time.  Skin: Skin is warm and dry.  Psychiatric: She has a normal mood and affect. Her behavior is normal. Judgment and thought content normal.   Results for orders placed during the hospital encounter of 06/24/14 (from the past 24 hour(s))  URINALYSIS, ROUTINE W REFLEX MICROSCOPIC     Status: Abnormal   Collection Time    06/24/14  8:30 PM      Result Value Ref Range   Color, Urine YELLOW  YELLOW   APPearance CLEAR  CLEAR   Specific Gravity, Urine 1.020  1.005 - 1.030  pH 6.0  5.0 - 8.0   Glucose, UA 100 (*) NEGATIVE mg/dL   Hgb urine dipstick LARGE (*) NEGATIVE   Bilirubin Urine NEGATIVE  NEGATIVE   Ketones, ur 15 (*) NEGATIVE mg/dL   Protein, ur 100 (*) NEGATIVE mg/dL   Urobilinogen, UA 1.0  0.0 - 1.0 mg/dL   Nitrite POSITIVE (*) NEGATIVE   Leukocytes, UA NEGATIVE  NEGATIVE  URINE MICROSCOPIC-ADD ON     Status: Abnormal   Collection Time    06/24/14  8:30 PM      Result Value Ref Range   Squamous Epithelial / LPF FEW (*) RARE   WBC, UA 3-6  <3 WBC/hpf   RBC / HPF TOO NUMEROUS TO COUNT  <3 RBC/hpf   Bacteria, UA FEW (*) RARE  POCT PREGNANCY, URINE     Status: Abnormal   Collection Time    06/24/14  8:38 PM      Result Value  Ref Range   Preg Test, Ur POSITIVE (*) NEGATIVE  HCG, QUANTITATIVE, PREGNANCY     Status: Abnormal   Collection Time    06/24/14  9:35 PM      Result Value Ref Range   hCG, Beta Chain, Quant, S 2025 (*) <5 mIU/mL  ABO/RH     Status: None   Collection Time    06/24/14  9:35 PM      Result Value Ref Range   ABO/RH(D) B POS    CBC     Status: None   Collection Time    06/24/14  9:35 PM      Result Value Ref Range   WBC 5.9  4.0 - 10.5 K/uL   RBC 4.26  3.87 - 5.11 MIL/uL   Hemoglobin 12.9  12.0 - 15.0 g/dL   HCT 37.7  36.0 - 46.0 %   MCV 88.5  78.0 - 100.0 fL   MCH 30.3  26.0 - 34.0 pg   MCHC 34.2  30.0 - 36.0 g/dL   RDW 13.7  11.5 - 15.5 %   Platelets 275  150 - 400 K/uL  WET PREP, GENITAL     Status: Abnormal   Collection Time    06/24/14 10:09 PM      Result Value Ref Range   Yeast Wet Prep HPF POC NONE SEEN  NONE SEEN   Trich, Wet Prep NONE SEEN  NONE SEEN   Clue Cells Wet Prep HPF POC NONE SEEN  NONE SEEN   WBC, Wet Prep HPF POC FEW (*) NONE SEEN  COMPREHENSIVE METABOLIC PANEL     Status: None   Collection Time    06/25/14  1:25 AM      Result Value Ref Range   Sodium 138  137 - 147 mEq/L   Potassium 3.7  3.7 - 5.3 mEq/L   Chloride 101  96 - 112 mEq/L   CO2 24  19 - 32 mEq/L   Glucose, Bld 97  70 - 99 mg/dL   BUN 7  6 - 23 mg/dL   Creatinine, Ser 0.71  0.50 - 1.10 mg/dL   Calcium 9.1  8.4 - 10.5 mg/dL   Total Protein 7.1  6.0 - 8.3 g/dL   Albumin 3.7  3.5 - 5.2 g/dL   AST 13  0 - 37 U/L   ALT 15  0 - 35 U/L   Alkaline Phosphatase 67  39 - 117 U/L   Total Bilirubin 0.3  0.3 - 1.2 mg/dL   GFR calc non Af  Amer >90  >90 mL/min   GFR calc Af Amer >90  >90 mL/min   Anion gap 13  5 - 15  .  US Ob Comp Less 14 Wks  06/24/2014   CLINICAL DATA:  Bleeding since 06/21/2014. Left lower quadrant pain and cramps since 1845 hr today. Estimated gestational age by LMP is 1 week 4 days. Quantitative beta HCG is 2025. History of prior ectopic pregnancies.  EXAM: OBSTETRIC <14 WK  [place] AND TRANSVAGINAL OB US  TECHNIQUE: Both transabdominal and transvaginal ultrasound examinations were performed for complete evaluation of the gestation as well as the maternal uterus, adnexal regions, and pelvic cul-de-sac. Transvaginal technique was performed to assess early pregnancy.  COMPARISON:  None.  FINDINGS: Intrauterine gestational sac: No intrauterine gestational sac identified.  Yolk sac:  Not identified.  Embryo:  Not identified.  Cardiac Activity: Not identified.  Maternal uterus/adnexae: Uterus is anteverted. Multiple hypoechoic myometrial mass lesions are demonstrated. Five separate lesions are visualize, largest measuring 2.5 x 3 x 2.5 cm. This is consistent with multiple uterine fibroids.  Both ovaries are visualized. Right ovary demonstrates normal follicular changes with hemorrhagic cysts measuring up to about 2.6 cm maximal diameter.  Left ovarian tissue with normal follicular changes is demonstrated. There appears to be a complex hyperechoic paraovarian structure measuring about 2.2 x 2.5 x 2 cm. Due to technical difficulties, only limited color flow Doppler images of the area air available there is no definite flow within the structure and only minimal peripheral flow is suggested. On real-time imaging, this appears to be separate from the left ovary. No gestational sac is visualized appearance is suspicious for but not diagnostic of ectopic pregnancy. Followup is suggested.  Minimal free fluid demonstrated in the pelvis.  IMPRESSION: No intrauterine pregnancy visualized. Indeterminate hyperechoic focus in the left adnexa, likely adjacent to the left ovary. Appearance is suspicious for but not diagnostic of ectopic pregnancy. Follow-up recommended.  These results were called by telephone at the time of interpretation on 06/24/2014 at 11:40 pm to [facility] , who verbally acknowledged these results.   Electronically Signed   By: Lucienne Capers M.D.   On: 06/24/2014 23:43   US Ob  Transvaginal  06/24/2014   CLINICAL DATA:  Bleeding since 06/21/2014. Left lower quadrant pain and cramps since 1845 hr today. Estimated gestational age by LMP is 1 week 4 days. Quantitative beta HCG is 2025. History of prior ectopic pregnancies.  EXAM: OBSTETRIC <14 WK [place] AND TRANSVAGINAL OB US  TECHNIQUE: Both transabdominal and transvaginal ultrasound examinations were performed for complete evaluation of the gestation as well as the maternal uterus, adnexal regions, and pelvic cul-de-sac. Transvaginal technique was performed to assess early pregnancy.  COMPARISON:  None.  FINDINGS: Intrauterine gestational sac: No intrauterine gestational sac identified.  Yolk sac:  Not identified.  Embryo:  Not identified.  Cardiac Activity: Not identified.  Maternal uterus/adnexae: Uterus is anteverted. Multiple hypoechoic myometrial mass lesions are demonstrated. Five separate lesions are visualize, largest measuring 2.5 x 3 x 2.5 cm. This is consistent with multiple uterine fibroids.  Both ovaries are visualized. Right ovary demonstrates normal follicular changes with hemorrhagic cysts measuring up to about 2.6 cm maximal diameter.  Left ovarian tissue with normal follicular changes is demonstrated. There appears to be a complex hyperechoic paraovarian structure measuring about 2.2 x 2.5 x 2 cm. Due to technical difficulties, only limited color flow Doppler images of the area air available there is no definite flow within the structure and only minimal  peripheral flow is suggested. On real-time imaging, this appears to be separate from the left ovary. No gestational sac is visualized appearance is suspicious for but not diagnostic of ectopic pregnancy. Followup is suggested.  Minimal free fluid demonstrated in the pelvis.  IMPRESSION: No intrauterine pregnancy visualized. Indeterminate hyperechoic focus in the left adnexa, likely adjacent to the left ovary. Appearance is suspicious for but not diagnostic of ectopic  pregnancy. Follow-up recommended.  These results were called by telephone at the time of interpretation on 06/24/2014 at 11:40 pm to [facility] , who verbally acknowledged these results.   Electronically Signed   By: Lucienne Capers M.D.   On: 06/24/2014 23:43        MAU Course  Procedures  MDM  Call Dr Suzzette Righter who will review labs and ultrasound. MTX was ordered. Patient denies any pain Urine culture Assessment and Plan   A: Possible Ectopic Pregnancy Questionable UTI   P: Make an appointment with Dr Landry Mellow next week to follow up on Ectopic Pregnancy Methotrexate tonight Day 1/ Sunday Day 4/ Wednesday Day 7/ Saturday Cipro 500 mg PO BID x 3 days Return to MAU with any Excessive pain/ bleeding    Georgia Duff 06/25/2014, 2:03 AM

## 2014-06-25 LAB — COMPREHENSIVE METABOLIC PANEL
ALT: 15 U/L (ref 0–35)
AST: 13 U/L (ref 0–37)
Albumin: 3.7 g/dL (ref 3.5–5.2)
Alkaline Phosphatase: 67 U/L (ref 39–117)
Anion gap: 13 (ref 5–15)
BUN: 7 mg/dL (ref 6–23)
CALCIUM: 9.1 mg/dL (ref 8.4–10.5)
CO2: 24 mEq/L (ref 19–32)
Chloride: 101 mEq/L (ref 96–112)
Creatinine, Ser: 0.71 mg/dL (ref 0.50–1.10)
GFR calc Af Amer: >90 mL/min (ref >90)
Glucose, Bld: 97 mg/dL (ref 70–99)
Potassium: 3.7 mEq/L (ref 3.7–5.3)
Sodium: 138 mEq/L (ref 137–147)
Total Bilirubin: 0.3 mg/dL (ref 0.3–1.2)
Total Protein: 7.1 g/dL (ref 6.0–8.3)

## 2014-06-25 LAB — HIV ANTIBODY (ROUTINE TESTING W REFLEX): HIV: NONREACTIVE

## 2014-06-25 LAB — TYPE AND SCREEN
ABO/RH(D): B POS
ANTIBODY SCREEN: NEGATIVE

## 2014-06-25 LAB — ABO/RH: ABO/RH(D): B POS

## 2014-06-25 MED ORDER — ACETAMINOPHEN-CODEINE #3 300-30 MG PO TABS: 1.0000 | ORAL_TABLET | Freq: Four times a day (QID) | ORAL | Status: DC | PRN

## 2014-06-25 MED ORDER — METHOTREXATE INJECTION FOR WOMEN'S HOSPITAL
50.0000 mg/m2 | Freq: Once | INTRAMUSCULAR | Status: AC
  Administered 2014-06-25: 105 mg via INTRAMUSCULAR
  Filled 2014-06-25: qty 2.1

## 2014-06-25 MED ORDER — CIPROFLOXACIN HCL 500 MG PO TABS: 500.0000 mg | ORAL_TABLET | Freq: Two times a day (BID) | ORAL | Status: DC

## 2014-06-25 NOTE — Discharge Instructions (Signed)
Urinary Tract Infection Urinary tract infections (UTIs) can develop anywhere along your urinary tract. Your urinary tract is your body's drainage system for removing wastes and extra water. Your urinary tract includes two kidneys, two ureters, a bladder, and a urethra. Your kidneys are a pair of bean-shaped organs. Each kidney is about the size of your fist. They are located below your ribs, one on each side of your spine. CAUSES Infections are caused by microbes, which are microscopic organisms, including fungi, viruses, and bacteria. These organisms are so small that they can only be seen through a microscope. Bacteria are the microbes that most commonly cause UTIs. SYMPTOMS  Symptoms of UTIs may vary by age and gender of the patient and by the location of the infection. Symptoms in young women typically include a frequent and intense urge to urinate and a painful, burning feeling in the bladder or urethra during urination. Older women and men are more likely to be tired, shaky, and weak and have muscle aches and abdominal pain. A fever may mean the infection is in your kidneys. Other symptoms of a kidney infection include pain in your back or sides below the ribs, nausea, and vomiting. DIAGNOSIS To diagnose a UTI, your caregiver will ask you about your symptoms. Your caregiver also will ask to provide a urine sample. The urine sample will be tested for bacteria and white blood cells. White blood cells are made by your body to help fight infection. TREATMENT  Typically, UTIs can be treated with medication. Because most UTIs are caused by a bacterial infection, they usually can be treated with the use of antibiotics. The choice of antibiotic and length of treatment depend on your symptoms and the type of bacteria causing your infection. HOME CARE INSTRUCTIONS  If you were prescribed antibiotics, take them exactly as your caregiver instructs you. Finish the medication even if you feel better after you  have only taken some of the medication.  Drink enough water and fluids to keep your urine clear or pale yellow.  Avoid caffeine, tea, and carbonated beverages. They tend to irritate your bladder.  Empty your bladder often. Avoid holding urine for long periods of time.  Empty your bladder before and after sexual intercourse.  After a bowel movement, women should cleanse from front to back. Use each tissue only once. SEEK MEDICAL CARE IF:   You have back pain.  You develop a fever.  Your symptoms do not begin to resolve within 3 days. SEEK IMMEDIATE MEDICAL CARE IF:   You have severe back pain or lower abdominal pain.  You develop chills.  You have nausea or vomiting.  You have continued burning or discomfort with urination. MAKE SURE YOU:   Understand these instructions.  Will watch your condition.  Will get help right away if you are not doing well or get worse. Document Released: 06/18/2005 Document Revised: 03/09/2012 Document Reviewed: 10/17/2011 [facility] Patient Information 2015 [place], [place]. This information is not intended to replace advice given to you by your health care provider. Make sure you discuss any questions you have with your health care provider. Ectopic Pregnancy An ectopic pregnancy is when the fertilized egg attaches (implants) outside the uterus. Most ectopic pregnancies occur in the fallopian tube. Rarely do ectopic pregnancies occur on the ovary, intestine, pelvis, or cervix. In an ectopic pregnancy, the fertilized egg does not have the ability to develop into a normal, healthy baby.  A ruptured ectopic pregnancy is one in which the fallopian tube gets  torn or bursts and results in internal bleeding. Often there is intense abdominal pain, and sometimes, vaginal bleeding. Having an ectopic pregnancy can be life threatening. If left untreated, this dangerous condition can lead to a blood transfusion, abdominal surgery, or even death. CAUSES  Damage to  the fallopian tubes is the suspected cause in most ectopic pregnancies.  RISK FACTORS Depending on your circumstances, the risk of having an ectopic pregnancy will vary. The level of risk can be divided into three categories. High Risk  You have gone through infertility treatment.  You have had a previous ectopic pregnancy.  You have had previous tubal surgery.  You have had previous surgery to have the fallopian tubes tied (tubal ligation).  You have tubal problems or diseases.  You have been exposed to DES. DES is a medicine that was used until 1971 and had effects on babies whose mothers took the medicine.  You become pregnant while using an intrauterine device (IUD) for birth control. Moderate Risk  You have a history of infertility.  You have a history of a sexually transmitted infection (STI).  You have a history of pelvic inflammatory disease (PID).  You have scarring from endometriosis.  You have multiple sexual partners.  You smoke. Low Risk  You have had previous pelvic surgery.  You use vaginal douching.  You became sexually active before 33 years of age. SIGNS AND SYMPTOMS  An ectopic pregnancy should be suspected in anyone who has missed a period and has abdominal pain or bleeding.  You may experience normal pregnancy symptoms, such as:  Nausea.  Tiredness.  Breast tenderness.  Other symptoms may include:  Pain with intercourse.  Irregular vaginal bleeding or spotting.  Cramping or pain on one side or in the lower abdomen.  Fast heartbeat.  Passing out while having a bowel movement.  Symptoms of a ruptured ectopic pregnancy and internal bleeding may include:  Sudden, severe pain in the abdomen and pelvis.  Dizziness or fainting.  Pain in the shoulder area. DIAGNOSIS  Tests that may be performed include:  A pregnancy test.  An ultrasound test.  Testing the specific level of pregnancy hormone in the bloodstream.  Taking a  sample of uterus tissue (dilation and curettage, D&C).  Surgery to perform a visual exam of the inside of the abdomen using a thin, lighted tube with a tiny camera on the end (laparoscope). TREATMENT  An injection of a medicine called methotrexate may be given. This medicine causes the pregnancy tissue to be absorbed. It is given if:  The diagnosis is made early.  The fallopian tube has not ruptured.  You are considered to be a good candidate for the medicine. Usually, pregnancy hormone blood levels are checked after methotrexate treatment. This is to be sure the medicine is effective. It may take 4-6 weeks for the pregnancy to be absorbed (though most pregnancies will be absorbed by 3 weeks). Surgical treatment may be needed. A laparoscope may be used to remove the pregnancy tissue. If severe internal bleeding occurs, a cut (incision) may be made in the lower abdomen (laparotomy), and the ectopic pregnancy is removed. This stops the bleeding. Part of the fallopian tube, or the whole tube, may be removed as well (salpingectomy). After surgery, pregnancy hormone tests may be done to be sure there is no pregnancy tissue left. You may receive a Rho (D) immune globulin shot if you are Rh negative and the father is Rh positive, or if you do not know the  Rh type of the father. This is to prevent problems with any future pregnancy. SEEK IMMEDIATE MEDICAL CARE IF:  You have any symptoms of an ectopic pregnancy. This is a medical emergency. MAKE SURE YOU:  Understand these instructions.  Will watch your condition.  Will get help right away if you are not doing well or get worse. Document Released: 10/16/2004 Document Revised: 01/23/2014 Document Reviewed: 04/07/2013 [facility] Patient Information 2015 [place], [place]. This information is not intended to replace advice given to you by your health care provider. Make sure you discuss any questions you have with your health care provider.

## 2014-06-26 LAB — URINE CULTURE: Colony Count: 40000

## 2014-06-26 LAB — GC/CHLAMYDIA PROBE AMP
CT Probe RNA: NEGATIVE
GC Probe RNA: NEGATIVE

## 2014-06-26 NOTE — MAU Provider Note (Signed)
I reviewed images personally and discussed with radiologist. He stated EMS was 3 mm.  It is difficult to appreciate on still images but he stated that ultrasonographer felt that in real time mass was separate from the ovary.

## 2014-06-27 ENCOUNTER — Telehealth: Payer: Self-pay | Admitting: Obstetrics and Gynecology

## 2014-06-27 NOTE — Telephone Encounter (Signed)
TC from patient (patient of Dr. Morene Antu with likely left sided ectopic pregnancy on 10.3, received Methotrexate that day.  [place].   Having "moderate" pain at present.  Small amount spotting.  Just took Tylenol #3 as prescribed.  No syncope or dizziness.  Hx right ectopic pregnancy 2011.  "Not as bad as the last ectopic". Issue of possible pain s/p MTX reviewed, but also advised patient could be sx of a ruptured ectopic. Will have patient observe x 1 hour s/p taking pain med.  She is to call me back in 1 to 1 1/2 hours with update.  Patient lives in [place]. Husband with her.

## 2014-06-27 NOTE — Telephone Encounter (Signed)
F/u to previous TC regarding pain--likely right ectopic, had MTX on Sunday.   Took single Tylenol #3--pain minimal now. Will CTO, continue Tylenol #3. If pain breaks through Tylenol #3, she is to call, or if she has any other issues (bleeding, syncope, etc.)  Donnel Saxon, CNM 06/27/14 0130

## 2014-06-28 ENCOUNTER — Encounter (HOSPITAL_COMMUNITY): Payer: Self-pay

## 2014-06-28 ENCOUNTER — Inpatient Hospital Stay (HOSPITAL_COMMUNITY)
Admission: AD | Admit: 2014-06-28 | Discharge: 2014-06-28 | Disposition: A | Discharge status: Home / Self Care | Payer: BC Managed Care – PPO | Source: Ambulatory Visit | Attending: Obstetrics and Gynecology | Admitting: Obstetrics and Gynecology

## 2014-06-28 DIAGNOSIS — O001 Tubal pregnancy: Secondary | ICD-10-CM | POA: Diagnosis present

## 2014-06-28 DIAGNOSIS — O009 Unspecified ectopic pregnancy without intrauterine pregnancy: Secondary | ICD-10-CM

## 2014-06-28 LAB — HCG, QUANTITATIVE, PREGNANCY: hCG, Beta Chain, Quant, S: 582 m[IU]/mL — ABNORMAL HIGH (ref <5)

## 2014-06-28 NOTE — MAU Note (Signed)
Patient to MAU for day 4 BHCG s/p MTX. States she has a soreness and a little spotting.

## 2014-06-28 NOTE — MAU Provider Note (Signed)
S:  Marie [name] is a 33 y.o. female [phone] at [redacted]w[redacted]d who presents for Day 4 methotrexate for left ectopic pregnancy. She has some mild tenderness in her lower abdomen, denies pain. Attest to a small amount of vaginal bleeding.   O:  GENERAL: Well-developed, well-nourished female in no acute distress.  HEENT: Normocephalic, atraumatic.   LUNGS: Effort normal HEART: Regular rate  ABDOMEN: Generalized tenderness; soft.  SKIN: Warm, dry and without erythema PSYCH: Normal mood and affect  Filed Vitals:   06/28/14 0914  BP: 150/80  Pulse: 79  Resp: 16    Results for orders placed during the hospital encounter of 06/28/14 (from the past 48 hour(s))  HCG, QUANTITATIVE, PREGNANCY     Status: Abnormal   Collection Time    06/28/14  9:27 AM      Result Value Ref Range   hCG, Beta Chain, Quant, S 582 (*) <5 mIU/mL   Comment:              GEST. AGE      CONC.  (mIU/mL)       <=1 WEEK        5 - 50         2 WEEKS       50 - 500         3 WEEKS       100 - 10,000         4 WEEKS     1,000 - 30,000         5 WEEKS     3,500 - 115,000       6-8 WEEKS     12,000 - 270,000        12 WEEKS     15,000 - 220,000                FEMALE AND NON-PREGNANT FEMALE:         LESS THAN 5 mIU/mL    CLINICAL DATA: Bleeding since 06/21/2014. Left lower quadrant pain  and cramps since 1845 hr today. Estimated gestational age by LMP is  1 week 4 days. Quantitative beta HCG is 2025. History of prior  ectopic pregnancies.  EXAM:  OBSTETRIC <14 WK [place] AND TRANSVAGINAL OB US  TECHNIQUE:  Both transabdominal and transvaginal ultrasound examinations were  performed for complete evaluation of the gestation as well as the  maternal uterus, adnexal regions, and pelvic cul-de-sac.  Transvaginal technique was performed to assess early pregnancy.  COMPARISON: None.  FINDINGS:  Intrauterine gestational sac: No intrauterine gestational sac  identified.  Yolk sac: Not identified.  Embryo: Not identified.   Cardiac Activity: Not identified.  Maternal uterus/adnexae: Uterus is anteverted. Multiple hypoechoic  myometrial mass lesions are demonstrated. Five separate lesions are  visualize, largest measuring 2.5 x 3 x 2.5 cm. This is consistent  with multiple uterine fibroids.  Both ovaries are visualized. Right ovary demonstrates normal  follicular changes with hemorrhagic cysts measuring up to about 2.6  cm maximal diameter.  Left ovarian tissue with normal follicular changes is demonstrated.  There appears to be a complex hyperechoic paraovarian structure  measuring about 2.2 x 2.5 x 2 cm. Due to technical difficulties,  only limited color flow Doppler images of the area air available  there is no definite flow within the structure and only minimal  peripheral flow is suggested. On real-time imaging, this appears to  be separate from the left ovary. No gestational sac is visualized  appearance is  suspicious for but not diagnostic of ectopic  pregnancy. Followup is suggested.  Minimal free fluid demonstrated in the pelvis.  IMPRESSION:  No intrauterine pregnancy visualized. Indeterminate hyperechoic  focus in the left adnexa, likely adjacent to the left ovary.  Appearance is suspicious for but not diagnostic of ectopic  pregnancy. Follow-up recommended.  These results were called by telephone at the time of interpretation  on 06/24/2014 at 11:40 pm to [facility] , who verbally acknowledged these  results.  Electronically Signed  By: Lucienne Capers M.D.  On: 06/24/2014 23:43  MDM: Day 1 beta hcg level: 2025 Day 4 beta hcg level 582  Discussed labs with Dr. Landry Mellow; patient to return for Day 7 labs (Saturday)   A:  1. Ectopic pregnancy   2.  Appropriate decline in Beta hcg level   P:  Discharge home in stable condition Return Saturday for day 7 beta hcg level  Ectopic precautions discussed. Return with any increased pain or bleeding   [name], NP 06/28/2014 1:12  PM

## 2014-06-28 NOTE — Discharge Instructions (Signed)
Ectopic Pregnancy °An ectopic pregnancy happens when a fertilized egg grows outside the uterus. A pregnancy cannot live outside of the uterus. This problem often happens in the fallopian tube. It is often caused by damage to the fallopian tube. °If this problem is found early, you may be treated with medicine. If your tube tears or bursts open (ruptures), you will bleed inside. This is an emergency. You will need surgery. Get help right away.  °SYMPTOMS °You may have normal pregnancy symptoms at first. These include: °· Missing your period. °· Feeling sick to your stomach (nauseous). °· Being tired. °· Having tender breasts. °Then, you may start to have symptoms that are not normal. These include: °· Pain with sex (intercourse). °· Bleeding from the vagina. This includes light bleeding (spotting). °· Belly (abdomen) or lower belly cramping or pain. This may be felt on one side. °· A fast heartbeat (pulse). °· Passing out (fainting) after going poop (bowel movement). °If your tube tears, you may have symptoms such as: °· Really bad pain in the belly or lower belly. This happens suddenly. °· Dizziness. °· Passing out. °· Shoulder pain. °GET HELP RIGHT AWAY IF:  °You have any of these symptoms. This is an emergency. °MAKE SURE YOU: °· Understand these instructions. °· Will watch your condition. °· Will get help right away if you are not doing well or get worse. °Document Released: 12/05/2008 Document Revised: 09/13/2013 Document Reviewed: 04/20/2013 °[employer]® Patient Information ©2015 [employer]. This information is not intended to replace advice given to you by your health care provider. Make sure you discuss any questions you have with your health care provider. ° °

## 2014-07-01 ENCOUNTER — Inpatient Hospital Stay (HOSPITAL_COMMUNITY)
Admission: AD | Admit: 2014-07-01 | Discharge: 2014-07-01 | Disposition: A | Discharge status: Home / Self Care | Payer: BC Managed Care – PPO | Source: Ambulatory Visit | Attending: Obstetrics and Gynecology | Admitting: Obstetrics and Gynecology

## 2014-07-01 ENCOUNTER — Encounter (HOSPITAL_COMMUNITY): Payer: Self-pay

## 2014-07-01 DIAGNOSIS — O009 Unspecified ectopic pregnancy without intrauterine pregnancy: Secondary | ICD-10-CM

## 2014-07-01 DIAGNOSIS — O001 Tubal pregnancy: Secondary | ICD-10-CM | POA: Insufficient documentation

## 2014-07-01 LAB — HCG, QUANTITATIVE, PREGNANCY: HCG, BETA CHAIN, QUANT, S: 113 m[IU]/mL — AB (ref <5)

## 2014-07-01 NOTE — MAU Provider Note (Signed)
S:  Ms. Marie [name] is a 33 y.o. female [phone] at [redacted]w[redacted]d who presents for Day 7 methotrexate for left ectopic pregnancy. She has some mild tenderness in her lower abdomen, denies pain. Attest to a small amount of vaginal spotting.   O:   GENERAL: Well-developed, well-nourished female in no acute distress.  HEENT: Normocephalic, atraumatic.   LUNGS: Effort normal HEART: Regular rate  SKIN: Warm, dry and without erythema PSYCH: Normal mood and affect  Filed Vitals:   07/01/14 0906  BP: 139/88  Pulse: 70  Temp: 99 F (37.2 C)  TempSrc: Oral  Resp: 16  Height: 5\' 1"  (1.549 m)  Weight: 100.755 kg (222 lb 2 oz)   Results for orders placed during the hospital encounter of 07/01/14 (from the past 48 hour(s))  HCG, QUANTITATIVE, PREGNANCY     Status: Abnormal   Collection Time    07/01/14  9:13 AM      Result Value Ref Range   hCG, Beta Chain, Quant, S 113 (*) <5 mIU/mL   Comment:              GEST. AGE      CONC.  (mIU/mL)       <=1 WEEK        5 - 50         2 WEEKS       50 - 500         3 WEEKS       100 - 10,000         4 WEEKS     1,000 - 30,000         5 WEEKS     3,500 - 115,000       6-8 WEEKS     12,000 - 270,000        12 WEEKS     15,000 - 220,000                FEMALE AND NON-PREGNANT FEMALE:         LESS THAN 5 mIU/mL   MDM: Day 1 beta hcg level: 2025  Day 4 beta hcg level 582  Day 7 beta hcg level 113 B positive blood type Discussed labs with Dr. Alesia Richards; patient to call and schedule a follow up appointment with Dr. Landry Mellow.   A: 1. Ectopic pregnancy    P: Appropriate decline in beta hcg levels Patient denies pain Follow up with Dr. Landry Mellow as scheduled; patient has an appointment scheduled Return to MAU with any pain or increased bleeding.  Pelvic rest   Darrelyn Hillock Rasch, NP  07/01/2014 10:04 AM

## 2014-07-01 NOTE — MAU Note (Signed)
Pt states here for repeat labs. Still spotting, no pain.

## 2014-07-20 ENCOUNTER — Emergency Department (HOSPITAL_COMMUNITY)
Admission: EM | Admit: 2014-07-20 | Discharge: 2014-07-20 | Disposition: A | Discharge status: Home / Self Care | Payer: BC Managed Care – PPO | Attending: Emergency Medicine | Admitting: Emergency Medicine

## 2014-07-20 ENCOUNTER — Encounter (HOSPITAL_COMMUNITY): Payer: Self-pay | Admitting: Emergency Medicine

## 2014-07-20 DIAGNOSIS — H81399 Other peripheral vertigo, unspecified ear: Secondary | ICD-10-CM | POA: Insufficient documentation

## 2014-07-20 DIAGNOSIS — R030 Elevated blood-pressure reading, without diagnosis of hypertension: Secondary | ICD-10-CM | POA: Diagnosis not present

## 2014-07-20 DIAGNOSIS — R42 Dizziness and giddiness: Secondary | ICD-10-CM | POA: Diagnosis present

## 2014-07-20 DIAGNOSIS — Z91040 Latex allergy status: Secondary | ICD-10-CM | POA: Insufficient documentation

## 2014-07-20 LAB — COMPREHENSIVE METABOLIC PANEL
ALK PHOS: 86 U/L (ref 39–117)
ALT: 13 U/L (ref 0–35)
AST: 15 U/L (ref 0–37)
Albumin: 4.2 g/dL (ref 3.5–5.2)
Anion gap: 17 — ABNORMAL HIGH (ref 5–15)
BILIRUBIN TOTAL: 0.2 mg/dL — AB (ref 0.3–1.2)
BUN: 9 mg/dL (ref 6–23)
CO2: 24 meq/L (ref 19–32)
Calcium: 9.5 mg/dL (ref 8.4–10.5)
Chloride: 98 mEq/L (ref 96–112)
Creatinine, Ser: 0.61 mg/dL (ref 0.50–1.10)
GLUCOSE: 83 mg/dL (ref 70–99)
POTASSIUM: 3.7 meq/L (ref 3.7–5.3)
Sodium: 139 mEq/L (ref 137–147)
Total Protein: 7.9 g/dL (ref 6.0–8.3)

## 2014-07-20 LAB — CBC WITH DIFFERENTIAL/PLATELET
Basophils Absolute: 0 10*3/uL (ref 0.0–0.1)
Basophils Relative: 0 % (ref 0–1)
EOS ABS: 0.1 10*3/uL (ref 0.0–0.7)
Eosinophils Relative: 2 % (ref 0–5)
HEMATOCRIT: 41.1 % (ref 36.0–46.0)
Hemoglobin: 13.9 g/dL (ref 12.0–15.0)
Lymphocytes Relative: 32 % (ref 12–46)
Lymphs Abs: 1.8 10*3/uL (ref 0.7–4.0)
MCH: 30.6 pg (ref 26.0–34.0)
MCHC: 33.8 g/dL (ref 30.0–36.0)
MCV: 90.5 fL (ref 78.0–100.0)
MONO ABS: 0.3 10*3/uL (ref 0.1–1.0)
Monocytes Relative: 5 % (ref 3–12)
NEUTROS ABS: 3.4 10*3/uL (ref 1.7–7.7)
Neutrophils Relative %: 61 % (ref 43–77)
Platelets: 354 10*3/uL (ref 150–400)
RBC: 4.54 MIL/uL (ref 3.87–5.11)
RDW: 13.3 % (ref 11.5–15.5)
WBC: 5.5 10*3/uL (ref 4.0–10.5)

## 2014-07-20 LAB — URINALYSIS, ROUTINE W REFLEX MICROSCOPIC
BILIRUBIN URINE: NEGATIVE
Glucose, UA: NEGATIVE mg/dL
HGB URINE DIPSTICK: NEGATIVE
KETONES UR: NEGATIVE mg/dL
Leukocytes, UA: NEGATIVE
Nitrite: NEGATIVE
PROTEIN: NEGATIVE mg/dL
Specific Gravity, Urine: 1.02 (ref 1.005–1.030)
UROBILINOGEN UA: 0.2 mg/dL (ref 0.0–1.0)
pH: 7 (ref 5.0–8.0)

## 2014-07-20 MED ORDER — MECLIZINE HCL 25 MG PO TABS
25.0000 mg | ORAL_TABLET | Freq: Once | ORAL | Status: AC
  Administered 2014-07-20: 25 mg via ORAL
  Filled 2014-07-20: qty 1

## 2014-07-20 MED ORDER — MECLIZINE HCL 25 MG PO TABS: 25.0000 mg | ORAL_TABLET | Freq: Three times a day (TID) | ORAL | Status: DC | PRN

## 2014-07-20 NOTE — ED Notes (Signed)
Shanon Brow NP at the bedside.

## 2014-07-20 NOTE — ED Notes (Signed)
Pt sts dizziness today; pt sts recent hx of ectopic pregnancy with treatment; pt sts noted to be hypertensive over last several months but more so today; pt sts dizziness in improved and only with movement at present

## 2014-07-20 NOTE — ED Notes (Signed)
Attempted to draw pts labs was unsuccessful called [facility] they will draw pts labs.

## 2014-07-20 NOTE — ED Provider Notes (Signed)
CSN: [id number]     Arrival date & time 07/20/14  1245 History   First MD Initiated Contact with Patient 07/20/14 1502     Chief Complaint  Patient presents with  . Dizziness  . Hypertension     (Consider location/radiation/quality/duration/timing/severity/associated sxs/prior Treatment) Patient is a 33 y.o. female presenting with dizziness and hypertension. The history is provided by the patient. No language interpreter was used.  Dizziness Quality:  Head spinning and vertigo Severity:  Moderate Onset quality:  Sudden Duration:  1 day Timing:  Sporadic Progression:  Waxing and waning Chronicity:  New Context: bending over, head movement and standing up   Relieved by:  Being still Worsened by:  Turning head and movement Ineffective treatments:  None tried Associated symptoms: no headaches, no syncope and no vision changes   Hypertension Pertinent negatives include no headaches or visual change.    Past Medical History  Diagnosis Date  . No pertinent past medical history    Past Surgical History  Procedure Laterality Date  . Laparscopy     Family History  Problem Relation Age of Onset  . Hypertension Mother    History  Substance Use Topics  . Smoking status: Never Smoker   . Smokeless tobacco: Not on file  . Alcohol Use: No   OB History   Grav Para Term Preterm Abortions TAB SAB Ect Mult Living   4 1  1 1   1  1      Review of Systems  Cardiovascular: Negative for syncope.  Neurological: Positive for dizziness. Negative for headaches.  All other systems reviewed and are negative.     Allergies  Shellfish allergy; Latex; Red dye; and Sulfa antibiotics  Home Medications   Prior to Admission medications   Medication Sig Start Date End Date Taking? Authorizing Provider  acetaminophen (TYLENOL) 325 MG tablet Take 650 mg by mouth every 6 (six) hours as needed (for pain).   Yes Historical Provider, MD  esomeprazole (NEXIUM) 20 MG capsule Take 20 mg by mouth  daily as needed (for acid reflux).    Yes Historical Provider, MD   BP 170/97  Pulse 74  Temp(Src) 98.4 F (36.9 C) (Oral)  Resp 18  Ht 5\' 2"  (1.575 m)  Wt 219 lb (99.338 kg)  BMI 40.05 kg/m2  SpO2 97%  LMP 06/13/2014  Breastfeeding? Unknown Physical Exam  Nursing note and vitals reviewed. Constitutional: She is oriented to person, place, and time. She appears well-developed and well-nourished.  HENT:  Head: Normocephalic.  Right Ear: External ear normal.  Left Ear: External ear normal.  Mouth/Throat: Oropharynx is clear and moist.  Eyes: EOM are normal. Pupils are equal, round, and reactive to light.  Neck: Neck supple.  Cardiovascular: Normal rate and regular rhythm.   Pulmonary/Chest: Effort normal and breath sounds normal.  Abdominal: Soft. Bowel sounds are normal.  Musculoskeletal: Normal range of motion.  Lymphadenopathy:    She has no cervical adenopathy.  Neurological: She is alert and oriented to person, place, and time. She has normal strength. No cranial nerve deficit or sensory deficit. She displays a negative Romberg sign. GCS eye subscore is 4. GCS verbal subscore is 5. GCS motor subscore is 6.  Negative test of skew. Mild nystagmus to the left.  Skin: Skin is warm and dry.  Psychiatric: She has a normal mood and affect.    ED Course  Procedures (including critical care time) Labs Review Labs Reviewed  URINALYSIS, ROUTINE W REFLEX MICROSCOPIC - Abnormal; Notable for  the following:    APPearance HAZY (*)    All other components within normal limits  CBC WITH DIFFERENTIAL  COMPREHENSIVE METABOLIC PANEL    Imaging Review No results found.   EKG Interpretation None      MDM  Symptoms resolved after meclizine.  Transient hypertension, no current indication of end-organ damage. Final diagnoses:  None    Peripheral vertigo. Transient hypertension.    Norman Herrlich, NP 07/21/14 [phone]

## 2014-07-20 NOTE — Discharge Instructions (Signed)
Benign Positional Vertigo Vertigo means you feel like you or your surroundings are moving when they are not. Benign positional vertigo is the most common form of vertigo. Benign means that the cause of your condition is not serious. Benign positional vertigo is more common in older adults. CAUSES  Benign positional vertigo is the result of an upset in the labyrinth system. This is an area in the middle ear that helps control your balance. This may be caused by a viral infection, head injury, or repetitive motion. However, often no specific cause is found. SYMPTOMS  Symptoms of benign positional vertigo occur when you move your head or eyes in different directions. Some of the symptoms may include:  Loss of balance and falls.  Vomiting.  Blurred vision.  Dizziness.  Nausea.  Involuntary eye movements (nystagmus). DIAGNOSIS  Benign positional vertigo is usually diagnosed by physical exam. If the specific cause of your benign positional vertigo is unknown, your caregiver may perform imaging tests, such as magnetic resonance imaging (MRI) or computed tomography (CT). TREATMENT  Your caregiver may recommend movements or procedures to correct the benign positional vertigo. Medicines such as meclizine, benzodiazepines, and medicines for nausea may be used to treat your symptoms. In rare cases, if your symptoms are caused by certain conditions that affect the inner ear, you may need surgery. HOME CARE INSTRUCTIONS   Follow your caregiver's instructions.  Move slowly. Do not make sudden body or head movements.  Avoid driving.  Avoid operating heavy machinery.  Avoid performing any tasks that would be dangerous to you or others during a vertigo episode.  Drink enough fluids to keep your urine clear or pale yellow. SEEK IMMEDIATE MEDICAL CARE IF:   You develop problems with walking, weakness, numbness, or using your arms, hands, or legs.  You have difficulty speaking.  You develop  severe headaches.  Your nausea or vomiting continues or gets worse.  You develop visual changes.  Your family or friends notice any behavioral changes.  Your condition gets worse.  You have a fever.  You develop a stiff neck or sensitivity to light. MAKE SURE YOU:   Understand these instructions.  Will watch your condition.  Will get help right away if you are not doing well or get worse. Document Released: 06/16/2006 Document Revised: 12/01/2011 Document Reviewed: 05/29/2011 [facility] Patient Information 2015 [place], [place]. This information is not intended to replace advice given to you by your health care provider. Make sure you discuss any questions you have with your health care provider. Hypertension Hypertension, commonly called high blood pressure, is when the force of blood pumping through your arteries is too strong. Your arteries are the blood vessels that carry blood from your heart throughout your body. A blood pressure reading consists of a higher number over a lower number, such as 110/72. The higher number (systolic) is the pressure inside your arteries when your heart pumps. The lower number (diastolic) is the pressure inside your arteries when your heart relaxes. Ideally you want your blood pressure below 120/80. Hypertension forces your heart to work harder to pump blood. Your arteries may become narrow or stiff. Having hypertension puts you at risk for heart disease, stroke, and other problems.  RISK FACTORS Some risk factors for high blood pressure are controllable. Others are not.  Risk factors you cannot control include:   Race. You may be at higher risk if you are African American.  Age. Risk increases with age.  Gender. Men are at higher risk than  women before age 6 years. After age 3, women are at higher risk than men. Risk factors you can control include:  Not getting enough exercise or physical activity.  Being overweight.  Getting too much fat,  sugar, calories, or salt in your diet.  Drinking too much alcohol. SIGNS AND SYMPTOMS Hypertension does not usually cause signs or symptoms. Extremely high blood pressure (hypertensive crisis) may cause headache, anxiety, shortness of breath, and nosebleed. DIAGNOSIS  To check if you have hypertension, your health care provider will measure your blood pressure while you are seated, with your arm held at the level of your heart. It should be measured at least twice using the same arm. Certain conditions can cause a difference in blood pressure between your right and left arms. A blood pressure reading that is higher than normal on one occasion does not mean that you need treatment. If one blood pressure reading is high, ask your health care provider about having it checked again. TREATMENT  Treating high blood pressure includes making lifestyle changes and possibly taking medicine. Living a healthy lifestyle can help lower high blood pressure. You may need to change some of your habits. Lifestyle changes may include:  Following the DASH diet. This diet is high in fruits, vegetables, and whole grains. It is low in salt, red meat, and added sugars.  Getting at least 2 hours of brisk physical activity every week.  Losing weight if necessary.  Not smoking.  Limiting alcoholic beverages.  Learning ways to reduce stress. If lifestyle changes are not enough to get your blood pressure under control, your health care provider may prescribe medicine. You may need to take more than one. Work closely with your health care provider to understand the risks and benefits. HOME CARE INSTRUCTIONS  Have your blood pressure rechecked as directed by your health care provider.   Take medicines only as directed by your health care provider. Follow the directions carefully. Blood pressure medicines must be taken as prescribed. The medicine does not work as well when you skip doses. Skipping doses also puts you  at risk for problems.   Do not smoke.   Monitor your blood pressure at home as directed by your health care provider. SEEK MEDICAL CARE IF:   You think you are having a reaction to medicines taken.  You have recurrent headaches or feel dizzy.  You have swelling in your ankles.  You have trouble with your vision. SEEK IMMEDIATE MEDICAL CARE IF:  You develop a severe headache or confusion.  You have unusual weakness, numbness, or feel faint.  You have severe chest or abdominal pain.  You vomit repeatedly.  You have trouble breathing. MAKE SURE YOU:   Understand these instructions.  Will watch your condition.  Will get help right away if you are not doing well or get worse. Document Released: 09/08/2005 Document Revised: 01/23/2014 Document Reviewed: 07/01/2013 [facility] Patient Information 2015 [place], [place]. This information is not intended to replace advice given to you by your health care provider. Make sure you discuss any questions you have with your health care provider.

## 2014-07-20 NOTE — ED Notes (Signed)
MD Knapp at the bedside  

## 2014-07-24 ENCOUNTER — Encounter (HOSPITAL_COMMUNITY): Payer: Self-pay | Admitting: Emergency Medicine

## 2015-05-15 ENCOUNTER — Other Ambulatory Visit (HOSPITAL_COMMUNITY)
Admission: RE | Admit: 2015-05-15 | Discharge: 2015-05-15 | Disposition: A | Discharge status: Home / Self Care | Payer: BLUE CROSS/BLUE SHIELD | Source: Ambulatory Visit | Attending: Obstetrics and Gynecology | Admitting: Obstetrics and Gynecology

## 2015-05-15 ENCOUNTER — Other Ambulatory Visit: Payer: Self-pay | Admitting: Obstetrics and Gynecology

## 2015-05-15 DIAGNOSIS — Z01419 Encounter for gynecological examination (general) (routine) without abnormal findings: Secondary | ICD-10-CM | POA: Diagnosis present

## 2015-05-16 LAB — CYTOLOGY - PAP

## 2015-10-04 ENCOUNTER — Ambulatory Visit (HOSPITAL_COMMUNITY)
Admission: RE | Admit: 2015-10-04 | Discharge: 2015-10-04 | Disposition: A | Discharge status: Home / Self Care | Payer: BLUE CROSS/BLUE SHIELD | Source: Ambulatory Visit | Attending: Rheumatology | Admitting: Rheumatology

## 2015-10-04 ENCOUNTER — Other Ambulatory Visit (HOSPITAL_COMMUNITY): Payer: Self-pay | Admitting: Rheumatology

## 2015-10-04 DIAGNOSIS — R0602 Shortness of breath: Secondary | ICD-10-CM | POA: Diagnosis present

## 2015-12-04 ENCOUNTER — Encounter: Payer: Self-pay | Admitting: Skilled Nursing Facility1

## 2015-12-04 ENCOUNTER — Encounter: Payer: BLUE CROSS/BLUE SHIELD | Attending: Internal Medicine | Admitting: Skilled Nursing Facility1

## 2015-12-04 VITALS — Ht 62.0 in | Wt 216.0 lb

## 2015-12-04 DIAGNOSIS — Z713 Dietary counseling and surveillance: Secondary | ICD-10-CM | POA: Diagnosis present

## 2015-12-04 DIAGNOSIS — E669 Obesity, unspecified: Secondary | ICD-10-CM

## 2015-12-04 LAB — BASIC METABOLIC PANEL
BUN: 7 mg/dL (ref 4–21)
Creatinine: 0.7 mg/dL (ref 0.5–1.1)
GLUCOSE: 97 mg/dL
Potassium: 4.2 mmol/L (ref 3.4–5.3)
SODIUM: 140 mmol/L (ref 137–147)

## 2015-12-04 LAB — CBC AND DIFFERENTIAL
HCT: 40 % (ref 36–46)
Hemoglobin: 13.7 g/dL (ref 12.0–16.0)
Platelets: 344 10*3/uL (ref 150–399)
WBC: 5 10^3/mL

## 2015-12-04 LAB — HEPATIC FUNCTION PANEL
ALT: 11 U/L (ref 7–35)
AST: 12 U/L — AB (ref 13–35)
Alkaline Phosphatase: 79 U/L (ref 25–125)
Bilirubin, Total: 0.3 mg/dL

## 2015-12-04 NOTE — Patient Instructions (Signed)
-  Aim to drink your whole bottle of Avian -Try to cut the soda down to 12 ounces a day -Aim to cut out your coolaid and for no more than 8 ounces of 100% FRUIT JUICE -Try to cut the meals from outside the home to 3 times a week with the eventual goal being no more than once a week -A meal: carbohydrate, protein, vegetable -A snack: carbohydrate OR vegetable AND protein

## 2015-12-04 NOTE — Progress Notes (Signed)
  Medical Nutrition Therapy:  Appt start time: 1030 end time:  1130.   Assessment:  Primary concerns today: referred for obesity.  Pt states she needs to lose weight in order for her to get breast reduction surgery. Pt states she needs to get breast reduction surgery due to scarring and pain. Pt states she wants to be 170 pounds because her physician said that would be a good weight for her. Pt states she has sleep apnea but she sleeps 8 hours of restful sleep every night. Pt states she has been 140 pounds before and felt she looked like she was 110 pounds. Pt states she was 179 pounds during the breast feeding of her child for a year but gained weight after she stopped breast feeding due to not accommodating her calorie count. Pt states she has 2 bowel movements every day without strain. Pt states she is currently taking phentermine for the past 2 months with 7 pounds lost and no reported dehydrated symptoms even though she is only consuming about 20 ounces of water a day. Pt states she will have a C-PAP machine delivered to her later this week.  Pt was resilient to any diet/lifestyle changes.  Preferred Learning Style:   No preference indicated   Learning Readiness:   Not ready MEDICATIONS: See List   DIETARY INTAKE:  Usual eating pattern includes 2 meals and 1 snacks per day.  Everyday foods include fast food.  Avoided foods include none stated.    24-hr recall:  B ( AM): none-------fruit Snk ( AM): none L ( PM): fast food Snk ( PM): none D ( PM): chicken-----soup Snk ( PM): none Beverages: coolaid, juice, soda  *meals outside the home: 7+  Usual physical activity: ADL's  Estimated energy needs: 1600 calories 180 g carbohydrates 120 g protein 44 g fat  Progress Towards Goal(s):  In progress.   Nutritional Diagnosis:  [place]-3.3 Overweight/obesity As related to overconsumption of fast food/physical inactivity.  As evidenced by pt report, 24 hr recall, and BMI 39.51.     Intervention:  Nutrition counseling for obesity. Dietitian educated the pt on balanced meals, meal frequency, hunger/fullness cues, sugary beverages, and physical activity. Goals: -Aim to drink your whole bottle of Avian -Try to cut the soda down to 12 ounces a day -Aim to cut out your coolaid and for no more than 8 ounces of 100% FRUIT JUICE -Try to cut the meals from outside the home to 3 times a week with the eventual goal being no more than once a week -A meal: carbohydrate, protein, vegetable -A snack: carbohydrate OR vegetable AND protein  Teaching Method Utilized:  Visual Auditory Hands on  Handouts given during visit include:  Snack sheet  Barriers to learning/adherence to lifestyle change: not ready  Demonstrated degree of understanding via:  Teach Back   Monitoring/Evaluation:  Dietary intake, exercise, and body weight prn.

## 2016-05-19 ENCOUNTER — Other Ambulatory Visit (HOSPITAL_COMMUNITY)
Admission: RE | Admit: 2016-05-19 | Discharge: 2016-05-19 | Disposition: A | Discharge status: Home / Self Care | Payer: BLUE CROSS/BLUE SHIELD | Source: Ambulatory Visit | Attending: Obstetrics and Gynecology | Admitting: Obstetrics and Gynecology

## 2016-05-19 ENCOUNTER — Other Ambulatory Visit: Payer: Self-pay | Admitting: Obstetrics and Gynecology

## 2016-05-19 DIAGNOSIS — R87810 Cervical high risk human papillomavirus (HPV) DNA test positive: Secondary | ICD-10-CM | POA: Diagnosis present

## 2016-05-19 DIAGNOSIS — Z01411 Encounter for gynecological examination (general) (routine) with abnormal findings: Secondary | ICD-10-CM | POA: Diagnosis present

## 2016-05-19 DIAGNOSIS — Z1151 Encounter for screening for human papillomavirus (HPV): Secondary | ICD-10-CM | POA: Diagnosis present

## 2016-05-20 LAB — CYTOLOGY - PAP

## 2016-08-05 DIAGNOSIS — Z79899 Other long term (current) drug therapy: Secondary | ICD-10-CM | POA: Insufficient documentation

## 2016-08-05 DIAGNOSIS — K219 Gastro-esophageal reflux disease without esophagitis: Secondary | ICD-10-CM | POA: Insufficient documentation

## 2016-08-05 DIAGNOSIS — E049 Nontoxic goiter, unspecified: Secondary | ICD-10-CM | POA: Insufficient documentation

## 2016-08-05 NOTE — Progress Notes (Signed)
Office Visit Note  Patient: Marie [name]             Date of Birth: [birth year]           MRN: [id number]             PCP: Wenda Low, MD Referring: Wenda Low, MD Visit Date: 08/08/2016 Occupation: Human resources for [employer]    Subjective:  Pain in multiple joints   History of Present Illness: Garnita Peshlakai is a 35 y.o. female with history of sero positive rheumatoid arthritis. She's been on Plaquenil since March 2017. She believes that Plaquenil is not working for her anymore. She reports pain in her multiple joints involving her shoulders, elbow joints, wrist joints, hands, hip joints, knee joints, ankle joints and feet. Right knee appears to have some swelling and fluid in it.  Activities of Daily Living:  Patient reports morning stiffness for 2 hours.   Patient Reports nocturnal pain.  Difficulty dressing/grooming: Denies Difficulty climbing stairs: Reports Difficulty getting out of chair: Reports Difficulty using hands for taps, buttons, cutlery, and/or writing: Reports   Review of Systems  Constitutional: Positive for fatigue. Negative for night sweats, weight gain, weight loss and weakness.  HENT: Negative for mouth sores, trouble swallowing, trouble swallowing, mouth dryness and nose dryness.   Eyes: Negative for pain, redness, visual disturbance and dryness.  Respiratory: Negative for cough, shortness of breath and difficulty breathing.   Cardiovascular: Negative for chest pain, palpitations, hypertension, irregular heartbeat and swelling in legs/feet.  Gastrointestinal: Negative for blood in stool, constipation and diarrhea.  Endocrine: Negative for increased urination.  Genitourinary: Negative for vaginal dryness.  Musculoskeletal: Positive for arthralgias, joint pain, joint swelling, morning stiffness and muscle tenderness. Negative for myalgias, muscle weakness and myalgias.  Skin: Negative for color change, rash, hair loss, skin tightness, ulcers  and sensitivity to sunlight.  Allergic/Immunologic: Negative for susceptible to infections.  Neurological: Negative for dizziness, memory loss and night sweats.  Hematological: Negative for swollen glands.  Psychiatric/Behavioral: Negative for depressed mood and sleep disturbance. The patient is not nervous/anxious.     PMFS History:  Patient Active Problem List   Diagnosis Date Noted  . 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 Plaquenil 200 mg daily, eye exam April 2017 08/05/2016  . Goiter 08/05/2016  . Gastroesophageal reflux disease without esophagitis 08/05/2016    Past Medical History:  Diagnosis Date  . GERD (gastroesophageal reflux disease)   . Goiter   . No pertinent past medical history   . Rheumatoid arthritis ([place])   . Vertigo     Family History  Problem Relation Age of Onset  . Hypertension Mother   . Arthritis Mother   . Sarcoidosis Mother   . Diabetes Other   . Diabetes Maternal Grandmother   . Congestive Heart Failure Maternal Grandmother   . Cancer Maternal Grandfather    Past Surgical History:  Procedure Laterality Date  . laparscopy    . phalopean tube extractions     Social History   Social History Narrative  . No narrative on file     Objective: Vital Signs: BP (!) 134/91 (BP Location: Left Arm, Patient Position: Sitting, Cuff Size: Large)   Pulse 69   Resp 14   Ht 5\' 2"  (1.575 m)   Wt 218 lb (98.9 kg)   BMI 39.87 kg/m    Physical Exam  Constitutional: She is oriented to person, place, and time. She appears well-developed and well-nourished.  HENT:  Head: Normocephalic and atraumatic.  Eyes: Conjunctivae and EOM are normal.  Neck: Normal range of motion.  Cardiovascular: Normal rate, regular rhythm, normal heart sounds and intact distal pulses.   Pulmonary/Chest: Effort normal and breath sounds normal.  Abdominal: Soft. Bowel sounds are normal.  Lymphadenopathy:    She has no cervical adenopathy.    Neurological: She is alert and oriented to person, place, and time.  Skin: Skin is warm and dry. Capillary refill takes less than 2 seconds.  Psychiatric: She has a normal mood and affect. Her behavior is normal.  Nursing note and vitals reviewed.    Musculoskeletal Exam: C-spine full range of motion with discomfort, thoracic and lumbar spine good range of motion. She has good range of motion of shoulders elbow joints wrist joint MCPs PIPs DIPs with no synovitis on examination although she had tenderness on palpation of her MCP joints and wrist joints. She also had painful range of motion of her shoulders and elbows. She has painful range of motion of bilateral hip joints bilateral knee joints bilateral ankle joints and tenderness across her MTP joints but no synovitis was noted.  CDAI Exam: CDAI Homunculus Exam:   Tenderness:  RUE: glenohumeral, ulnohumeral and radiohumeral and wrist LUE: glenohumeral, ulnohumeral and radiohumeral and wrist Right hand: 3rd MCP, 4th MCP and 5th MCP Left hand: 2nd MCP and 3rd MCP RLE: acetabulofemoral, tibiofemoral and tibiotalar LLE: acetabulofemoral, tibiofemoral and tibiotalar Right foot: 1st MTP, 4th MTP and 5th MTP Left foot: 1st MTP, 3rd MTP and 4th MTP  Joint Counts:  CDAI Tender Joint count: 13 CDAI Swollen Joint count: 0  Global Assessments:  Patient Global Assessment: 8 Provider Global Assessment: 6  CDAI Calculated Score: 27    Investigation: Findings:  January 2017 TB negative, hepatitis negative, HIV negative,Igsnormal, SPEP normal,CXR normal. March 2017 CBC normal, CMP normal, April 2017 eye exam normal    Imaging: No results found.  Speciality Comments: No specialty comments available.    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 She's been on Plaquenil since some March 2017 without adequate results.. She continues to have a lot of discomfort and morning stiffness in multiple joints.. Detailed discussion regarding different options indications side effects contraindications of methotrexate were discussed. She wants to proceed with the methotrexate. She has lot of some GI sensitivities we decided to proceed with subcutaneous methotrexate.Handout was given consent was taken. The plan is to start her on Rasuvo 10 mg subcutaneously per week if her labs are stable I'll increase it to 15 mg subcutaneously per week for 2 weeks and if labs are stable we'll increase it to 20 mg subcutaneously per week and stay on that dose. She will also need folic acid 2 mg by mouth daily. Standing orders will be given to check labs every 2 weeks 3 and then every 2 months to monitor for drug toxicity. Strict contraception was discussed, she has Norplant .  High-risk prescription: She is on Plaquenil 200 mg daily, eye exam April 2017. She's not having adequate response to it. I will try combination therapy for right now with methotrexate. If she does well on methotrexate I may drop her Plaquenil in future. We do not have any labs on her since March 2017. I'll check CBC and comprehensive metabolic panel today. I will advised her to get pneumococcal vaccine as  well.  Association of heart disease with rheumatoid arthritis was discussed. Need to monitor blood pressure, cholesterol, and to exercise 30-60 minutes on daily basis was discussed. Poor dental hygiene can be a predisposing factor for rheumatoid arthritis. Good dental hygiene was discussed.  She is requesting FMLA until she feels better. I will advised her to leave the forms and we can fill it for 2 days a month for right now until her symptoms are controlled.   She has h/o Goiter,Gastroesophageal reflux disease without esophagitis  Orders: No orders of the defined types were placed in this  encounter.  No orders of the defined types were placed in this encounter.   Face-to-face time spent with patient was 40 minutes. 50% of time was spent in counseling and coordination of care.  Follow-Up Instructions: Return in about 3 months (around 11/08/2016) for Rheumatoid arthritis.   Bo Merino, MD

## 2016-08-07 ENCOUNTER — Encounter: Payer: Self-pay | Admitting: *Deleted

## 2016-08-08 ENCOUNTER — Encounter: Payer: Self-pay | Admitting: Rheumatology

## 2016-08-08 ENCOUNTER — Ambulatory Visit (INDEPENDENT_AMBULATORY_CARE_PROVIDER_SITE_OTHER): Payer: BLUE CROSS/BLUE SHIELD | Admitting: Rheumatology

## 2016-08-08 VITALS — BP 134/91 | HR 69 | Resp 14 | Ht 62.0 in | Wt 218.0 lb

## 2016-08-08 DIAGNOSIS — M0609 Rheumatoid arthritis without rheumatoid factor, multiple sites: Secondary | ICD-10-CM | POA: Diagnosis not present

## 2016-08-08 DIAGNOSIS — Z79899 Other long term (current) drug therapy: Secondary | ICD-10-CM | POA: Diagnosis not present

## 2016-08-08 DIAGNOSIS — E049 Nontoxic goiter, unspecified: Secondary | ICD-10-CM

## 2016-08-08 DIAGNOSIS — K219 Gastro-esophageal reflux disease without esophagitis: Secondary | ICD-10-CM | POA: Diagnosis not present

## 2016-08-08 LAB — CBC WITH DIFFERENTIAL/PLATELET
Basophils Absolute: 0 cells/uL (ref 0–200)
Basophils Relative: 0 %
Eosinophils Absolute: 114 cells/uL (ref 15–500)
Eosinophils Relative: 2 %
HEMATOCRIT: 41.1 % (ref 35.0–45.0)
HEMOGLOBIN: 13.8 g/dL (ref 11.7–15.5)
LYMPHS ABS: 1881 {cells}/uL (ref 850–3900)
Lymphocytes Relative: 33 %
MCH: 30.2 pg (ref 27.0–33.0)
MCHC: 33.6 g/dL (ref 32.0–36.0)
MCV: 89.9 fL (ref 80.0–100.0)
MONO ABS: 456 {cells}/uL (ref 200–950)
MPV: 9.4 fL (ref 7.5–12.5)
Monocytes Relative: 8 %
NEUTROS PCT: 57 %
Neutro Abs: 3249 cells/uL (ref 1500–7800)
Platelets: 313 10*3/uL (ref 140–400)
RBC: 4.57 MIL/uL (ref 3.80–5.10)
RDW: 13.9 % (ref 11.0–15.0)
WBC: 5.7 10*3/uL (ref 3.8–10.8)

## 2016-08-08 LAB — COMPLETE METABOLIC PANEL WITH GFR
ALBUMIN: 4.2 g/dL (ref 3.6–5.1)
ALK PHOS: 63 U/L (ref 33–115)
ALT: 20 U/L (ref 6–29)
AST: 16 U/L (ref 10–30)
BILIRUBIN TOTAL: 0.3 mg/dL (ref 0.2–1.2)
BUN: 11 mg/dL (ref 7–25)
CALCIUM: 9.4 mg/dL (ref 8.6–10.2)
CO2: 27 mmol/L (ref 20–31)
CREATININE: 0.66 mg/dL (ref 0.50–1.10)
Chloride: 104 mmol/L (ref 98–110)
GFR, Est African American: >89 mL/min (ref >=60)
GFR, Est Non African American: >89 mL/min (ref >=60)
Glucose, Bld: 92 mg/dL (ref 65–99)
Potassium: 4 mmol/L (ref 3.5–5.3)
Sodium: 140 mmol/L (ref 135–146)
Total Protein: 7 g/dL (ref 6.1–8.1)

## 2016-08-08 MED ORDER — METHOTREXATE (PF) 15 MG/0.3ML ~~LOC~~ SOAJ
15.0000 mg | SUBCUTANEOUS | 0 refills | Status: DC
Start: 2016-08-08 — End: 2016-08-20

## 2016-08-08 MED ORDER — METHOTREXATE (PF) 20 MG/0.4ML ~~LOC~~ SOAJ: 20.0000 mg | SUBCUTANEOUS | 0 refills | Status: DC

## 2016-08-08 MED ORDER — FOLIC ACID 1 MG PO TABS: 1.0000 mg | ORAL_TABLET | Freq: Every day | ORAL | 3 refills | Status: DC

## 2016-08-08 NOTE — Patient Instructions (Addendum)
Standing Labs We placed an order today for your standing lab work.    Please come back and get your standing labs in two weeks times two then every 2 months.   We have open lab Monday through Friday from 8:30-11:30 AM and 1-4 PM at the office of Dr. Tresa Moore, PA.   The office is located at [address] [place], [place], [place] [address] No appointment is necessary.   Labs are drawn by [facility].  You may receive a bill from [facility] for your lab work.     Please talk to your primary care provider about getting a pneumococcal vaccine.     Methotrexate subcutaneous injection What is this medicine? METHOTREXATE (METH oh TREX ate) is a cytotoxic drug that also suppresses the immune system. It is used to treat psoriasis and rheumatoid arthritis. COMMON BRAND NAME(S): Otrexup, Rasuvo What should I tell my health care provider before I take this medicine? They need to know if you have any of these conditions: -fluid in the stomach area or lungs -if you often drink alcohol -infection or immune system problems -kidney disease -liver disease -low blood counts, like low white cell, platelet, or red cell counts -lung disease -radiation therapy -stomach ulcers -ulcerative colitis -an unusual or allergic reaction to methotrexate, other medicines, foods, dyes, or preservatives -pregnant or trying to get pregnant -breast-feeding How should I use this medicine? This medicine is for injection under the skin. You will be taught how to prepare and give this medicine. Refer to the Instructions for Use that come with your medication packaging. Use exactly as directed. Take your medicine at regular intervals. Do not take your medicine more often than directed. This medicine should be taken weekly, NOT daily. It is important that you put your used needles and syringes in a special sharps container. Do not put them in a trash can. If you do not have a sharps container, call your  pharmacist of healthcare provider to get one. Talk to your pediatrician regarding the use of this medicine in children. While this drug may be prescribed for children as young as 2 years for selected conditions, precautions do apply. What if I miss a dose? If you are not sure if this medicine was injected or if you have a hard time giving the injection, do not inject another dose. Talk with your doctor or health care professional. What may interact with this medicine? This medicine may interact with the following medications: -acitretin -aspirin or aspirin-like medicines including salicylates -azathioprine -certain antibiotics like chloramphenicol, penicillin, tetracycline -cyclosporine -gold -hydroxychloroquine -live virus vaccines -mercaptopurine -NSAIDs, medicines for pain and inflammation, like ibuprofen or naproxen -other cytotoxic agents -penicillamine -phenylbutazone -phenytoin -probenacid -retinoids such as isotretinoin and tretinoin -steroid medicines like prednisone or cortisone -sulfonamides like sulfasalazine and trimethoprim/sulfamethoxazole -theophylline What should I watch for while using this medicine? Avoid alcoholic drinks. This medicine can make you more sensitive to the sun. Keep out of the sun. If you cannot avoid being in the sun, wear protective clothing and use sunscreen. Do not use sun lamps or tanning beds/booths. You may get drowsy or dizzy. Do not drive, use machinery, or do anything that needs mental alertness until you know how this medicine affects you. Do not stand or sit up quickly, especially if you are an older patient. This reduces the risk of dizzy or fainting spells. You may need blood work done while you are taking this medicine. Call your doctor or health care professional for advice  if you get a fever, chills or sore throat, or other symptoms of a cold or flu. Do not treat yourself. This drug decreases your body's ability to fight infections. Try  to avoid being around people who are sick. This medicine may increase your risk to bruise or bleed. Call your doctor or health care professional if you notice any unusual bleeding. Check with your doctor or health care professional if you get an attack of severe diarrhea, nausea and vomiting, or if you sweat a lot. The loss of too much body fluid can make it dangerous for you to take this medicine. Talk to your doctor about your risk of cancer. You may be more at risk for certain types of cancers if you take this medicine. Both men and women must use effective birth control with this medicine. Do not become pregnant while taking this medicine or until at least 1 normal menstrual cycle has occurred after stopping it. Women should inform their doctor if they wish to become pregnant or think they might be pregnant. Men should not father a child while taking this medicine and for 3 months after stopping it. There is a potential for serious side effects to an unborn child. Talk to your health care professional or pharmacist for more information. Do not breast-feed an infant while taking this medicine. What side effects may I notice from receiving this medicine? Side effects that you should report to your doctor or health care professional as soon as possible: -allergic reactions like skin rash, itching or hives, swelling of the face, lips, or tongue -breathing problems or shortness of breath -diarrhea -dry, nonproductive cough -low blood counts - this medicine may decrease the number of white blood cells, red blood cells and platelets. You may be at increased risk of infections and bleeding -mouth sores -redness, blistering, peeling or loosening of the skin, including inside the mouth -signs of infection - fever or chills, cough, sore throat, pain or difficulty passing urine -signs and symptoms of bleeding such as bloody or black, tarry stools; red or dark-brown urine; spitting up blood or brown material  that looks like coffee grounds; red spots on the skin; unusual bruising or bleeding from the eye, gums, or nose -signs and symptoms of kidney injury like trouble passing urine or change in the amount of urine -signs and symptoms of liver injury like dark yellow or brown urine; general ill feeling or flu-like symptoms; light-colored stools; loss of appetite; nausea; right upper belly pain; unusually weak or tired; yellowing of the eyes or skin Side effects that usually do not require medical attention (report to your doctor or health care professional if they continue or are bothersome): -dizziness -hair loss -headache -stomach pain -upset stomach -vomiting Where should I keep my medicine? Keep out of the reach of children. You will be instructed on how to store this medicine. Throw away any unused medicine after the expiration date on the label.  2017 Elsevier/Gold Standard (2015-10-11 11:50:46)

## 2016-08-08 NOTE — Progress Notes (Signed)
Pharmacy Note  Subjective: Patient presents today to the [facility] to see Dr. Estanislado Pandy.  Patient seen by the pharmacist for counseling on methotrexate.    Objective: CBC    Component Value Date/Time   WBC 5.0 12/04/2015   WBC 5.5 07/20/2014 1446   RBC 4.54 07/20/2014 1446   HGB 13.7 12/04/2015   HCT 40 12/04/2015   PLT 344 12/04/2015   MCV 90.5 07/20/2014 1446   MCH 30.6 07/20/2014 1446   MCHC 33.8 07/20/2014 1446   RDW 13.3 07/20/2014 1446   LYMPHSABS 1.8 07/20/2014 1446   MONOABS 0.3 07/20/2014 1446   EOSABS 0.1 07/20/2014 1446   BASOSABS 0.0 07/20/2014 1446   CMP     Component Value Date/Time   NA 140 12/04/2015   K 4.2 12/04/2015   CL 98 07/20/2014 1446   CO2 24 07/20/2014 1446   GLUCOSE 83 07/20/2014 1446   BUN 7 12/04/2015   CREATININE 0.7 12/04/2015   CREATININE 0.61 07/20/2014 1446   CALCIUM 9.5 07/20/2014 1446   PROT 7.9 07/20/2014 1446   ALBUMIN 4.2 07/20/2014 1446   AST 12 (A) 12/04/2015   ALT 11 12/04/2015   ALKPHOS 79 12/04/2015   BILITOT 0.2 (L) 07/20/2014 1446   GFRNONAA >90 07/20/2014 1446   GFRAA >90 07/20/2014 1446    TB Gold: negative (10/04/15) Hepatitis panel: negative (12/04/15) HIV: negative (10/04/15)  Chest-xray:  On file from 10/04/15  Assessment/Plan:  Patient was initiated on injectable methotrexate (Rasuvo) 15 mg weekly for two weeks then 20 mg weekly.  Patient was counseled on the purpose, proper use, and adverse effects of methotrexate including nausea, infection, and signs and symptoms of pneumonitis.  Counseled patient that pregnancy is contraindicated while on methotrexate.  Patient confirms she uses birth control (has Nexplanon implant).  Reviewed instructions with patient to take methotrexate weekly along with folic acid 2 mg daily.  Discussed the importance of frequent monitoring of kidney and liver function and blood counts, and provided patient with standing lab instructions.  Noted that ibuprofen is on  patient's medication list.  Advised patient to limit ibuprofen use while on methotrexate.  Counseled patient to avoid sulfa antibiotics such as Bactrim or Septra while on methotrexate.  Provided patient with educational materials on methotrexate and answered all questions.  Advised patient to get annual influenza vaccine and to get a pneumococcal vaccine if patient has not already had one.  Patient voiced understanding.  Educated patient on how to use the Rasuvo pen.  Patient was provided with two Rasuvo 15 mg pen samples (Lot [id number], Exp 11/2016).  Patient consented to use of methotrexate.  Will upload consent into patient's chart.  Advised patient to initiate methotrexate once she gets her lab results from [place] next week.  Patient voiced understanding.    Elisabeth Most, Pharm.D., BCPS Clinical Pharmacist Pager: [phone] Phone: [phone] 08/08/2016 9:15 AM

## 2016-08-12 ENCOUNTER — Telehealth: Payer: Self-pay | Admitting: Radiology

## 2016-08-12 ENCOUNTER — Telehealth: Payer: Self-pay | Admitting: Rheumatology

## 2016-08-12 MED ORDER — METHOTREXATE (PF) 20 MG/0.4ML ~~LOC~~ SOAJ
20.0000 mg | SUBCUTANEOUS | 0 refills | Status: DC
Start: 2016-08-12 — End: 2016-08-20

## 2016-08-12 NOTE — Telephone Encounter (Signed)
Have refaxed Rasuvo with diagnosis code. Thank you

## 2016-08-12 NOTE — Telephone Encounter (Signed)
I will call patient to advise labs are normal  

## 2016-08-12 NOTE — Telephone Encounter (Signed)
m06.09

## 2016-08-12 NOTE — Telephone Encounter (Signed)
I have called patient to advise labs are normal  

## 2016-08-12 NOTE — Telephone Encounter (Signed)
[facility] called about needing a diagnosis code for the patient for a certain rx. They are also faxing over a prior authorization as well. Pharm cb# [phone]

## 2016-08-20 ENCOUNTER — Telehealth: Payer: Self-pay | Admitting: Rheumatology

## 2016-08-20 ENCOUNTER — Telehealth: Payer: Self-pay | Admitting: Radiology

## 2016-08-20 MED ORDER — METHOTREXATE SODIUM CHEMO INJECTION 50 MG/2ML: 20.0000 mg | INTRAMUSCULAR | 0 refills | Status: DC

## 2016-08-20 MED ORDER — "TUBERCULIN-ALLERGY SYRINGES 27G X 1/2"" 1 ML KIT": PACK | 3 refills | Status: DC

## 2016-08-20 NOTE — Telephone Encounter (Signed)
sent 

## 2016-08-20 NOTE — Telephone Encounter (Signed)
Left message for her to call me back if she needs injection training.

## 2016-08-20 NOTE — Progress Notes (Signed)
Received a fax response from [place] regarding a prior authorization denial for Rasuvo. It was denied because is able to prepare and administer generic injectable methotrexate.   Will scan documents in epic.   Ashlynne Shetterly, [place]

## 2016-08-20 NOTE — Telephone Encounter (Signed)
Patient says injection pre authorization was not approved and her insurance is requiring the generic be sent over. Patient is requesting [facility] [address].

## 2016-08-20 NOTE — Telephone Encounter (Signed)
Call pt about MTX training

## 2016-08-26 ENCOUNTER — Telehealth: Payer: Self-pay | Admitting: Rheumatology

## 2016-08-26 NOTE — Telephone Encounter (Signed)
Patient left FMLA forms on her last visit 11/17; calling to see if they are ready.

## 2016-08-27 NOTE — Telephone Encounter (Signed)
Patient returned Andrea's phone call.

## 2016-08-27 NOTE — Telephone Encounter (Signed)
Attempted to contact the patient and left message for patient to call the office.  

## 2016-08-28 NOTE — Telephone Encounter (Signed)
Patient advised we do not have her FMLA paperwork here at the office. Patient advised if she would drop of another copy I would be glad to get them filled out for her.

## 2016-09-12 ENCOUNTER — Telehealth: Payer: Self-pay | Admitting: Pharmacist

## 2016-09-12 DIAGNOSIS — Z79899 Other long term (current) drug therapy: Secondary | ICD-10-CM

## 2016-09-12 NOTE — Telephone Encounter (Signed)
Received a fax from [place] concerning possible drug interaction between ibuprofen and methotrexate.  At patient's visit on 08/08/16 I advised patient to limit ibuprofen while on methotrexate.  I called patient again to advise.  She reports she rarely takes ibuprofen.  I again advised her to avoid ibuprofen while on methotrexate.  She voiced understanding.    Patient reports she plans to come by the office this afternoon for standing labs.  I advised her our office closes early today and advised her to go to the [facility] lab center for her labwork.  I released her lab orders to be drawn today.  She voiced understanding.    Elisabeth Most, Pharm.D., BCPS Clinical Pharmacist Pager: [phone] Phone: [phone] 09/12/2016 10:13 AM

## 2016-09-19 ENCOUNTER — Telehealth: Payer: Self-pay | Admitting: Rheumatology

## 2016-09-19 NOTE — Telephone Encounter (Signed)
Paperwork is updated. Please call patient and let her know she can come pick it up.

## 2016-09-19 NOTE — Telephone Encounter (Signed)
Patient called Marie [name] to check the status of FMLA paperwork. Please advise.

## 2016-09-19 NOTE — Telephone Encounter (Signed)
Spoke to patient, let her know paperwork was ready to be picked up at front desk.

## 2016-09-22 HISTORY — PX: REDUCTION MAMMAPLASTY: SUR839

## 2016-09-23 ENCOUNTER — Other Ambulatory Visit: Payer: Self-pay | Admitting: Radiology

## 2016-09-23 DIAGNOSIS — Z79899 Other long term (current) drug therapy: Secondary | ICD-10-CM

## 2016-09-23 LAB — CBC WITH DIFFERENTIAL/PLATELET
BASOS ABS: 0 {cells}/uL (ref 0–200)
Basophils Relative: 0 %
Eosinophils Absolute: 94 cells/uL (ref 15–500)
Eosinophils Relative: 2 %
HCT: 40.7 % (ref 35.0–45.0)
Hemoglobin: 13.3 g/dL (ref 11.7–15.5)
Lymphocytes Relative: 42 %
Lymphs Abs: 1974 cells/uL (ref 850–3900)
MCH: 30 pg (ref 27.0–33.0)
MCHC: 32.7 g/dL (ref 32.0–36.0)
MCV: 91.9 fL (ref 80.0–100.0)
MONOS PCT: 7 %
MPV: 9.5 fL (ref 7.5–12.5)
Monocytes Absolute: 329 cells/uL (ref 200–950)
NEUTROS ABS: 2303 {cells}/uL (ref 1500–7800)
Neutrophils Relative %: 49 %
Platelets: 332 10*3/uL (ref 140–400)
RBC: 4.43 MIL/uL (ref 3.80–5.10)
RDW: 14.5 % (ref 11.0–15.0)
WBC: 4.7 10*3/uL (ref 3.8–10.8)

## 2016-09-24 LAB — COMPLETE METABOLIC PANEL WITHOUT GFR
ALT: 19 U/L (ref 6–29)
AST: 15 U/L (ref 10–30)
Albumin: 4.1 g/dL (ref 3.6–5.1)
Alkaline Phosphatase: 52 U/L (ref 33–115)
BUN: 12 mg/dL (ref 7–25)
CO2: 28 mmol/L (ref 20–31)
Calcium: 9.5 mg/dL (ref 8.6–10.2)
Chloride: 104 mmol/L (ref 98–110)
Creat: 0.73 mg/dL (ref 0.50–1.10)
GFR, Est African American: >89 mL/min
GFR, Est Non African American: >89 mL/min
Glucose, Bld: 102 mg/dL — ABNORMAL HIGH (ref 65–99)
Potassium: 4.2 mmol/L (ref 3.5–5.3)
Sodium: 141 mmol/L (ref 135–146)
Total Bilirubin: 0.4 mg/dL (ref 0.2–1.2)
Total Protein: 6.7 g/dL (ref 6.1–8.1)

## 2016-09-24 NOTE — Progress Notes (Signed)
Labs normal.

## 2016-10-28 ENCOUNTER — Other Ambulatory Visit: Payer: Self-pay | Admitting: Rheumatology

## 2016-10-28 NOTE — Telephone Encounter (Signed)
Last Visit: 08/08/16 Next Visit: 11/07/16 Labs: 09/23/16 WNL  Okay to refill MTX?

## 2016-11-03 DIAGNOSIS — R7989 Other specified abnormal findings of blood chemistry: Secondary | ICD-10-CM | POA: Insufficient documentation

## 2016-11-03 DIAGNOSIS — Z8719 Personal history of other diseases of the digestive system: Secondary | ICD-10-CM | POA: Insufficient documentation

## 2016-11-03 DIAGNOSIS — R748 Abnormal levels of other serum enzymes: Secondary | ICD-10-CM | POA: Insufficient documentation

## 2016-11-03 NOTE — Progress Notes (Deleted)
Office Visit Note  Patient: Marie [name]             Date of Birth: [birth year]           MRN: [id number]             PCP: Wenda Low, MD Referring: Wenda Low, MD Visit Date: 11/07/2016 Occupation: @GUAROCC @    Subjective:  No chief complaint on file.   History of Present Illness: Marie [name] is a 36 y.o. female ***   Activities of Daily Living:  Patient reports morning stiffness for *** {minute/hour:19697}.   Patient {ACTIONS;DENIES/REPORTS:21021675::"Denies"} nocturnal pain.  Difficulty dressing/grooming: {ACTIONS;DENIES/REPORTS:21021675::"Denies"} Difficulty climbing stairs: {ACTIONS;DENIES/REPORTS:21021675::"Denies"} Difficulty getting out of chair: {ACTIONS;DENIES/REPORTS:21021675::"Denies"} Difficulty using hands for taps, buttons, cutlery, and/or writing: {ACTIONS;DENIES/REPORTS:21021675::"Denies"}   No Rheumatology ROS completed.   PMFS History:  Patient Active Problem List   Diagnosis Date Noted  . 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 Plaquenil 200 mg daily, eye exam April 2017 08/05/2016  . Goiter 08/05/2016  . Gastroesophageal reflux disease without esophagitis 08/05/2016    Past Medical History:  Diagnosis Date  . GERD (gastroesophageal reflux disease)   . Goiter   . No pertinent past medical history   . Rheumatoid arthritis ([place])   . Vertigo     Family History  Problem Relation Age of Onset  . Hypertension Mother   . Arthritis Mother   . Sarcoidosis Mother   . Diabetes Other   . Diabetes Maternal Grandmother   . Congestive Heart Failure Maternal Grandmother   . Cancer Maternal Grandfather    Past Surgical History:  Procedure Laterality Date  . laparscopy    . phalopean tube extractions     Social History   Social History Narrative  . No narrative on file     Objective: Vital Signs: There were no vitals taken for this visit.   Physical Exam   Musculoskeletal Exam: ***  CDAI Exam: No CDAI  exam completed.    Investigation: Findings:  10/04/2015 X-ray of bilateral hands, 2 views, today were within normal limits without any joint space narrowing, erosive changes.  Bilateral feet x-rays, 2 views, were also within normal limits without any joint space narrowing or erosive changes.  She has small bilateral calcaneal spurs.  Bilateral knee joint x-rays were also within normal limits without any joint space narrowing or patellofemoral narrowing.  No chondrocalcinosis.    10/31/2015 Marie [name] was here to get ultrasound examination of bilateral hands due to ongoing pain and stiffness in her hands.    After informed consent was obtained per EULAR recommendation, ultrasound examination of the bilateral hands was performed.  Using 12 MHz transducer, grayscale and power Doppler, bilateral second, third, and fifth MCP joints and bilateral wrist joints, both dorsal and volar aspects, were evaluated.  The findings were that she had synovitis in her bilateral second and third MCP joints and right fifth MCP joint.  She also had synovitis in bilateral wrist joints.  Right media nerve was 0.07 cm squared and left media nerve was 0.07 cm squared.  These findings were consistent with inflammatory arthritis.     Labs from October 04, 2015 show CMP with GFR is normal.  CBC with differential is normal.  Sed rate is normal at 14.  CK is normal at 179.  Angiotensin-1 converting enzyme is normal at 32, but the patient does have a family history of sarcoidosis in her mother and sister.  HIV is negative.  IgG and IgA are  negative.  Rheumatoid factor is normal.  ANA is normal.  SPEP M-spike is negative.  Urinalysis is negative.  TB-Gold is negative.  CCP is positive at 23, but it is weak positive.  IgM is slightly low at 50.  RAPID 3 shows a raw score of 5 and an index of 1.7, which is consistent with low severity.   She has had positive rheumatoid factor, ACE level, and anti-DNAse B in the past.      Imaging: No  results found.  Speciality Comments: No specialty comments available.    Procedures:  No procedures performed Allergies: Shellfish allergy; Latex; Red dye; and Sulfa antibiotics   Assessment / Plan:     Visit Diagnoses: Rheumatoid arthritis  with negative rheumatoid factor  Plaquenil 200 mg daily, eye exam April 2017  History of gastroesophageal reflux (GERD)    Orders: No orders of the defined types were placed in this encounter.  No orders of the defined types were placed in this encounter.   Face-to-face time spent with patient was *** minutes. 50% of time was spent in counseling and coordination of care.  Follow-Up Instructions: No Follow-up on file.   Eyal Greenhaw, RT  Note - This record has been created using [employer].  Chart creation errors have been sought, but may not always  have been located. Such creation errors do not reflect on  the standard of medical care.

## 2016-11-07 ENCOUNTER — Ambulatory Visit: Payer: BLUE CROSS/BLUE SHIELD | Admitting: Rheumatology

## 2016-11-10 NOTE — Progress Notes (Signed)
Office Visit Note  Patient: Marie [name]             Date of Birth: Oct 12, 1980           MRN: [id number]             PCP: Wenda Low, MD Referring: Wenda Low, MD Visit Date: 11/17/2016 Occupation: @GUAROCC @    Subjective:  Stiff hands   History of Present Illness: Marie [name] is a 36 y.o. female with history of from sero positive rheumatoid arthritis. She states she is doing better on methotrexate. She denies any joint swelling or joint discomfort. None of the joints are painful right now. She is tolerating methotrexate without any problems  Activities of Daily Living:  Patient reports morning stiffness for 5 minutes.   Patient Denies nocturnal pain.  Difficulty dressing/grooming: Denies Difficulty climbing stairs: Denies Difficulty getting out of chair: Denies Difficulty using hands for taps, buttons, cutlery, and/or writing: Denies   Review of Systems  Constitutional: Negative for fatigue, night sweats, weight gain, weight loss and weakness.  HENT: Negative for mouth sores, trouble swallowing, trouble swallowing, mouth dryness and nose dryness.   Eyes: Negative for pain, redness, visual disturbance and dryness.  Respiratory: Negative for cough, shortness of breath and difficulty breathing.   Cardiovascular: Negative for chest pain, palpitations, hypertension, irregular heartbeat and swelling in legs/feet.  Gastrointestinal: Negative for blood in stool, constipation and diarrhea.  Endocrine: Negative for increased urination.  Genitourinary: Negative for vaginal dryness.  Musculoskeletal: Negative for arthralgias, joint pain, joint swelling, myalgias, muscle weakness, morning stiffness, muscle tenderness and myalgias.  Skin: Negative for color change, rash, hair loss, skin tightness, ulcers and sensitivity to sunlight.  Allergic/Immunologic: Negative for susceptible to infections.  Neurological: Negative for dizziness, memory loss and night sweats.  Hematological:  Negative for swollen glands.  Psychiatric/Behavioral: Negative for depressed mood and sleep disturbance. The patient is not nervous/anxious.     PMFS History:  Patient Active Problem List   Diagnosis Date Noted  . Rheumatoid arthritis of multiple sites with negative rheumatoid factor ([place]) 11/16/2016  . History of goiter 11/16/2016  . Family history of sarcoidosis 11/16/2016  . History of gastroesophageal reflux (GERD) 11/03/2016  . Rheumatoid factor positive 11/03/2016  . High serum angiotensin converting enzyme (ACE) 11/03/2016  . Goiter 08/05/2016  . Gastroesophageal reflux disease without esophagitis 08/05/2016    Past Medical History:  Diagnosis Date  . GERD (gastroesophageal reflux disease)   . Goiter   . No pertinent past medical history   . Rheumatoid arthritis ([place])   . Vertigo     Family History  Problem Relation Age of Onset  . Hypertension Mother   . Arthritis Mother   . Sarcoidosis Mother   . Diabetes Other   . Diabetes Maternal Grandmother   . Congestive Heart Failure Maternal Grandmother   . Cancer Maternal Grandfather    Past Surgical History:  Procedure Laterality Date  . laparscopy    . phalopean tube extractions     Social History   Social History Narrative  . No narrative on file     Objective: Vital Signs: BP (!) 138/94   Pulse 82   Resp 14   Ht 5\' 2"  (1.575 m)   Wt 229 lb (103.9 kg)   BMI 41.88 kg/m    Physical Exam  Constitutional: She is oriented to person, place, and time. She appears well-developed and well-nourished.  HENT:  Head: Normocephalic and atraumatic.  Eyes: Conjunctivae and EOM are normal.  Neck: Normal range of motion.  Cardiovascular: Normal rate, regular rhythm, normal heart sounds and intact distal pulses.   Pulmonary/Chest: Effort normal and breath sounds normal.  Abdominal: Soft. Bowel sounds are normal.  Lymphadenopathy:    She has no cervical adenopathy.  Neurological: She is alert and oriented to person,  place, and time.  Skin: Skin is warm and dry. Capillary refill takes less than 2 seconds.  Psychiatric: She has a normal mood and affect. Her behavior is normal.  Nursing note and vitals reviewed.    Musculoskeletal Exam: C-spine, thoracic, lumbar spine good range of motion. Shoulder joints, elbow joints, wrist joints, MCPs, PIPs DIPs with good range of motion with no synovitis. Hip joints, knee joints, ankle joints, MTPs PIPs DIPs with good range of motion with no synovitis.  CDAI Exam: CDAI Homunculus Exam:   Joint Counts:  CDAI Tender Joint count: 0 CDAI Swollen Joint count: 0  Global Assessments:  Patient Global Assessment: 3 Provider Global Assessment: 2  CDAI Calculated Score: 5    Investigation: Findings:  10/04/2015 X-ray of bilateral hands, 2 views, today were within normal limits without any joint space narrowing, erosive changes.  Bilateral feet x-rays, 2 views, were also within normal limits without any joint space narrowing or erosive changes.  She has small bilateral calcaneal spurs.  Bilateral knee joint x-rays were also within normal limits without any joint space narrowing or patellofemoral narrowing.  No chondrocalcinosis.    10/31/2015 Marie [name] was here to get ultrasound examination of bilateral hands due to ongoing pain and stiffness in her hands.  After informed consent was obtained per EULAR recommendation, ultrasound examination of the bilateral hands was performed.  Using 12 MHz transducer, grayscale and power Doppler, bilateral second, third, and fifth MCP joints and bilateral wrist joints, both dorsal and volar aspects, were evaluated.  The findings were that she had synovitis in her bilateral second and third MCP joints and right fifth MCP joint.  She also had synovitis in bilateral wrist joints.  Right media nerve was 0.07 cm squared and left media nerve was 0.07 cm squared.  These findings were consistent with inflammatory arthritis.    IMPRESSION AND PLAN:   We had a brief discussion regarding possible use of Plaquenil use and I have given her a handout for her to review.  We will discuss it at followup visit.  I also gave her a RAPID 3 form, which she can bring it next visit.  Labs from October 04, 2015 show CMP with GFR is normal.  CBC with differential is normal.  Sed rate is normal at 14.  CK is normal at 179.  Angiotensin-1 converting enzyme is normal at 32, but the patient does have a family history of sarcoidosis in her mother and sister.  HIV is negative.  IgG and IgA are negative.  Rheumatoid factor is normal.  ANA is normal.  SPEP M-spike is negative.  Urinalysis is negative.  TB-Gold is negative.  CCP is positive at 23, but it is weak positive.  IgM is slightly low at 50.    March 2017:  CBC and comprehensive metabolic panel were normal.  Hepatitis panel was negative.     Imaging: No results found.  Speciality Comments: No specialty comments available.    Procedures:  No procedures performed Allergies: Shellfish allergy; Latex; Red dye; and Sulfa antibiotics   Assessment / Plan:     Visit Diagnoses: Rheumatoid arthritis of multiple sites with negative rheumatoid factor (HCC) - RF  negative, anti-CCP positive, positive synovitis on ultrasound. Patient had no synovitis on examination today. She is tolerating methotrexate well.  High risk medication use - MTX 0.8 ml sq qwk, Folic acid 2mg  po qd. Her labs were normal in January we'll check labs every 3 months to monitor for drug toxicity. She understands strict contraception.  Obesity: Weight loss diet and exercise was discussed.  History of gastroesophageal reflux (GERD)  History of goiter  Family history of sarcoidosis   Association of heart disease with rheumatoid arthritis was discussed. Need to monitor blood pressure, cholesterol, and to exercise 30-60 minutes on daily basis was discussed. Poor dental hygiene can be a predisposing factor for rheumatoid arthritis. Good dental  hygiene was discussed. Orders: Orders Placed This Encounter  Procedures  . CBC with Differential/Platelet  . COMPLETE METABOLIC PANEL WITH GFR   Meds ordered this encounter  Medications  . folic acid (FOLVITE) 1 MG tablet    Sig: Take 2 tablets (2 mg total) by mouth daily.    Dispense:  180 tablet    Refill:  3    Face-to-face time spent with patient was 30 minutes. 50% of time was spent in counseling and coordination of care.  Follow-Up Instructions: Return in about 5 months (around 04/16/2017) for Rheumatoid arthritis.   Marie Merino, MD  Note - This record has been created using Editor, commissioning.  Chart creation errors have been sought, but may not always  have been located. Such creation errors do not reflect on  the standard of medical care.

## 2016-11-11 ENCOUNTER — Ambulatory Visit: Payer: BLUE CROSS/BLUE SHIELD | Admitting: Rheumatology

## 2016-11-16 DIAGNOSIS — Z8639 Personal history of other endocrine, nutritional and metabolic disease: Secondary | ICD-10-CM | POA: Insufficient documentation

## 2016-11-16 DIAGNOSIS — M0579 Rheumatoid arthritis with rheumatoid factor of multiple sites without organ or systems involvement: Secondary | ICD-10-CM | POA: Insufficient documentation

## 2016-11-16 DIAGNOSIS — Z832 Family history of diseases of the blood and blood-forming organs and certain disorders involving the immune mechanism: Secondary | ICD-10-CM | POA: Insufficient documentation

## 2016-11-17 ENCOUNTER — Encounter: Payer: Self-pay | Admitting: Rheumatology

## 2016-11-17 ENCOUNTER — Ambulatory Visit (INDEPENDENT_AMBULATORY_CARE_PROVIDER_SITE_OTHER): Payer: BLUE CROSS/BLUE SHIELD | Admitting: Rheumatology

## 2016-11-17 VITALS — BP 138/94 | HR 82 | Resp 14 | Ht 62.0 in | Wt 229.0 lb

## 2016-11-17 DIAGNOSIS — Z8639 Personal history of other endocrine, nutritional and metabolic disease: Secondary | ICD-10-CM

## 2016-11-17 DIAGNOSIS — Z79899 Other long term (current) drug therapy: Secondary | ICD-10-CM | POA: Diagnosis not present

## 2016-11-17 DIAGNOSIS — M0609 Rheumatoid arthritis without rheumatoid factor, multiple sites: Secondary | ICD-10-CM | POA: Diagnosis not present

## 2016-11-17 DIAGNOSIS — Z8719 Personal history of other diseases of the digestive system: Secondary | ICD-10-CM | POA: Diagnosis not present

## 2016-11-17 DIAGNOSIS — Z832 Family history of diseases of the blood and blood-forming organs and certain disorders involving the immune mechanism: Secondary | ICD-10-CM | POA: Diagnosis not present

## 2016-11-17 MED ORDER — FOLIC ACID 1 MG PO TABS: 2.0000 mg | ORAL_TABLET | Freq: Every day | ORAL | 3 refills | Status: DC

## 2016-11-17 NOTE — Patient Instructions (Signed)
Standing Labs We placed an order today for your standing lab work.    Please come back and get your standing labs in April and every 3 months  We have open lab Monday through Friday from 8:30-11:30 AM and 1:30-4 PM at the office of Dr. Darryl Blumenstein/Naitik Panwala, PA.   The office is located at [address], [address], [place], [place] [address] No appointment is necessary.   Labs are drawn by [facility].  You may receive a bill from [facility] for your lab work.    

## 2017-02-20 DIAGNOSIS — N62 Hypertrophy of breast: Secondary | ICD-10-CM

## 2017-02-20 HISTORY — DX: Hypertrophy of breast: N62

## 2017-03-20 ENCOUNTER — Encounter (HOSPITAL_BASED_OUTPATIENT_CLINIC_OR_DEPARTMENT_OTHER): Payer: Self-pay | Admitting: *Deleted

## 2017-03-23 ENCOUNTER — Encounter (HOSPITAL_COMMUNITY): Payer: Self-pay

## 2017-03-23 ENCOUNTER — Emergency Department (HOSPITAL_COMMUNITY)
Admission: EM | Admit: 2017-03-23 | Discharge: 2017-03-23 | Disposition: A | Discharge status: Home / Self Care | Payer: BLUE CROSS/BLUE SHIELD | Attending: Emergency Medicine | Admitting: Emergency Medicine

## 2017-03-23 DIAGNOSIS — R1111 Vomiting without nausea: Secondary | ICD-10-CM | POA: Insufficient documentation

## 2017-03-23 DIAGNOSIS — Z79899 Other long term (current) drug therapy: Secondary | ICD-10-CM | POA: Insufficient documentation

## 2017-03-23 DIAGNOSIS — Z91013 Allergy to seafood: Secondary | ICD-10-CM | POA: Insufficient documentation

## 2017-03-23 DIAGNOSIS — R42 Dizziness and giddiness: Secondary | ICD-10-CM | POA: Insufficient documentation

## 2017-03-23 DIAGNOSIS — Z91040 Latex allergy status: Secondary | ICD-10-CM | POA: Diagnosis not present

## 2017-03-23 MED ORDER — ONDANSETRON HCL 4 MG PO TABS: 4.0000 mg | ORAL_TABLET | Freq: Three times a day (TID) | ORAL | 0 refills | Status: DC | PRN

## 2017-03-23 MED ORDER — SODIUM CHLORIDE 0.9 % IV BOLUS (SEPSIS)
1000.0000 mL | Freq: Once | INTRAVENOUS | Status: AC
  Administered 2017-03-23: 1000 mL via INTRAVENOUS

## 2017-03-23 MED ORDER — MECLIZINE HCL 25 MG PO TABS: 25.0000 mg | ORAL_TABLET | Freq: Three times a day (TID) | ORAL | 0 refills | Status: DC | PRN

## 2017-03-23 MED ORDER — ONDANSETRON 4 MG PO TBDP
4.0000 mg | ORAL_TABLET | Freq: Once | ORAL | Status: AC
Start: 2017-03-23 — End: 2017-03-23
  Administered 2017-03-23: 4 mg via ORAL

## 2017-03-23 MED ORDER — ONDANSETRON 4 MG PO TBDP
ORAL_TABLET | ORAL | Status: AC
  Filled 2017-03-23: qty 1

## 2017-03-23 MED ORDER — MECLIZINE HCL 25 MG PO TABS
25.0000 mg | ORAL_TABLET | Freq: Once | ORAL | Status: AC
  Administered 2017-03-23: 25 mg via ORAL
  Filled 2017-03-23: qty 1

## 2017-03-23 NOTE — ED Triage Notes (Signed)
PT arrives POV from home with complaints of dizziness and emesis. Pt states she has hx of vertigo, woke up at 0400 dizzy and vomiting, took meclizine with little relief. She them called PCP and was told to take another meclizine at 0800 and if no better come to ED for further tx. Denies headache. She states the room is spinning.

## 2017-03-23 NOTE — ED Notes (Addendum)
Gave pt pre-packed bag lunch ([place] sandwich & applesauce) & a Sprite, per Perpetue - RN.

## 2017-03-23 NOTE — ED Provider Notes (Signed)
[facility] DEPT Provider Note   CSN: [id number] Arrival date & time: 03/23/17  1218     History   Chief Complaint Chief Complaint  Patient presents with  . Dizziness  . Emesis    HPI Marie [name] is a 36 y.o. female.  The history is provided by the patient and the spouse.  Dizziness  Quality:  Room spinning and vertigo Severity:  Moderate Onset quality:  Gradual Duration:  1 day Timing:  Intermittent Progression:  Improving Context: bending over, head movement and physical activity   Relieved by:  Being still and medication Worsened by:  Movement Associated symptoms: vomiting   Associated symptoms: no blood in stool, no chest pain, no diarrhea, no headaches, no palpitations, no shortness of breath, no syncope, no vision changes and no weakness   Risk factors: hx of vertigo   Emesis   Pertinent negatives include no abdominal pain, no arthralgias, no chills, no cough, no diarrhea, no fever and no headaches.    Past Medical History:  Diagnosis Date  . GERD (gastroesophageal reflux disease)   . 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 Rheumatoid arthritis ([place])    no current med.  . Sleep apnea    states no CPAP use in 1 month    Patient Active Problem List   Diagnosis Date Noted  . Rheumatoid arthritis of multiple sites with negative rheumatoid factor ([place]) 11/16/2016  . History of goiter 11/16/2016  . Family history of sarcoidosis 11/16/2016  . History of gastroesophageal reflux (GERD) 11/03/2016  . Rheumatoid factor positive 11/03/2016  . High serum angiotensin converting enzyme (ACE) 11/03/2016  . Goiter 08/05/2016  . Gastroesophageal reflux disease without esophagitis 08/05/2016    Past Surgical History:  Procedure Laterality Date  . LAPAROSCOPIC UNILATERAL SALPINGECTOMY Right 09/19/2010  . LAPAROSCOPY  2012   states realignment of right Fallopian tube    OB History    Gravida Para Term Preterm AB Living   4 1   1 1 1    SAB TAB Ectopic Multiple Live Births      1   1       Home Medications    Prior to Admission medications   Medication Sig Start Date End Date Taking? Authorizing Provider  esomeprazole (NEXIUM) 20 MG capsule Take 20 mg by mouth daily as needed (for acid reflux).    Yes [provider]  etonogestrel (NEXPLANON) 68 MG IMPL implant 68 mg by Subdermal route once.   Yes [provider]  meclizine (ANTIVERT) 25 MG tablet Take 25 mg by mouth 3 (three) times daily as needed for dizziness.   Yes [provider]  valACYclovir (VALTREX) 500 MG tablet Take 500 mg by mouth 2 (two) times daily as needed for other. 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 03/23/17   Valda Lamb, MD  ondansetron (ZOFRAN) 4 MG tablet Take 1 tablet (4 mg total) by mouth every 8 (eight) hours as needed for nausea or vomiting. 03/23/17   Valda Lamb, MD    Family History Family History  Problem Relation Age of Onset  . Hypertension Mother   . Arthritis Mother   . Sarcoidosis Mother   . Diabetes Other   . Diabetes Maternal Grandmother   . Congestive Heart Failure Maternal Grandmother   . Cancer Maternal Grandfather     Social History Social History  Substance Use Topics  .  Smoking status: Never Smoker  . Smokeless tobacco: Never Used  . Alcohol use No     Allergies   Shellfish allergy; Red dye; Latex; Sulfa antibiotics; and Sulfamethoxazole   Review of Systems Review of Systems  Constitutional: Negative for chills and fever.  HENT: Negative for ear pain and sore throat.   Eyes: Negative for pain and visual disturbance.  Respiratory: Negative for cough and shortness of breath.   Cardiovascular: Negative for chest pain, palpitations and syncope.  Gastrointestinal: Positive for vomiting. Negative for abdominal pain, blood in stool and diarrhea.  Genitourinary: Negative for dysuria and  hematuria.  Musculoskeletal: Negative for arthralgias and back pain.  Skin: Negative for color change and rash.  Neurological: Positive for dizziness. Negative for seizures, syncope, weakness and headaches.  Psychiatric/Behavioral: Negative for agitation.  All other systems reviewed and are negative.    Physical Exam Updated Vital Signs BP (!) 154/106   Pulse 75   Temp 98.6 F (37 C) (Oral)   Resp 20   Ht 5\' 2"  (1.575 m)   Wt 101.2 kg (223 lb)   SpO2 99%   BMI 40.79 kg/m   Physical Exam  Constitutional: She is oriented to person, place, and time. She appears well-developed and well-nourished. No distress.  HENT:  Head: Normocephalic and atraumatic.  Right Ear: External ear normal.  Left Ear: External ear normal.  Mouth/Throat: Oropharynx is clear and moist.  Eyes: Conjunctivae and EOM are normal. Pupils are equal, round, and reactive to light. Right eye exhibits no discharge. Left eye exhibits no discharge. No scleral icterus.  Neck: Normal range of motion. Neck supple. No tracheal deviation present.  Cardiovascular: Normal rate, regular rhythm and normal heart sounds.   No murmur heard. Pulmonary/Chest: Effort normal and breath sounds normal. No respiratory distress.  Abdominal: Soft. Bowel sounds are normal. She exhibits no distension. There is no tenderness.  Musculoskeletal: Normal range of motion. She exhibits no edema or deformity.  Neurological: She is alert and oriented to person, place, and time. No cranial nerve deficit or sensory deficit. She exhibits normal muscle tone. Coordination normal.  Normal finger to nose, able to ambulate  Skin: Skin is warm and dry.  Psychiatric: She has a normal mood and affect.  Nursing note and vitals reviewed.    ED Treatments / Results  Labs (all labs ordered are listed, but only abnormal results are displayed) Labs Reviewed - No data to display  EKG  EKG Interpretation None       Radiology No results  found.  Procedures Procedures (including critical care time)  Medications Ordered in ED Medications  ondansetron (ZOFRAN-ODT) disintegrating tablet 4 mg (4 mg Oral Given 03/23/17 1250)  sodium chloride 0.9 % bolus 1,000 mL (0 mLs Intravenous Stopped 03/23/17 2116)  meclizine (ANTIVERT) tablet 25 mg (25 mg Oral Given 03/23/17 2011)     Initial Impression / Assessment and Plan / ED Course  I have reviewed the triage vital signs and the nursing notes.  Pertinent labs & imaging results that were available during my care of the patient were reviewed by me and considered in my medical decision making (see chart for details).     Marie [name] is a 36 year old female with history of BPPV who is coming in today with emesis and dizziness. Patient states early this morning around 4 AM she woke up and had was very dizzy and had one episode of emesis. Called her family practitioner who instructed to take one of her 25 mg  meclizine at 4 AM and then an additional one at 8 AM. She endorses a few episodes of emesis throughout the day and he sent her to the ED for further evaluation. She states now she feels a little better after receiving Zofran and after the 2 meclizine doses. She denies any headaches. No chest pain, shortness of breath, abdominal pain. Endorses only drinking soda for the last few days while being outside.  On exam she is sitting up in bed in no apparent distress. Afocal neurologic exam. No nystagmus. Laying the bed flat as well as ambulation reproduces her dizziness however she is able to ambulate. We'll give bolus of fluid as well as 25 mg meclizine here in the emergency department. Likely peripheral vertigo and do not believe this is a central cause. Do not believe any CT imaging or additional lab work is necessary at this time.  On recheck at 9:40 PM, patient voice he felt much better after the bolus and meclizine. Able to ambulate without significant dizziness. Patient will be discharged  with prescription for Zofran as well as meclizine. Encouraged to follow up with her primary care provider in one week for recheck. She voiced understanding and agreement with plan.  Patient was seen with my attending, Dr. Venora Maples, who voiced agreement and oversaw the evaluation and treatment of this patient.   [employer] seismologist was used in the creation of this note. If there are any errors or inconsistencies needing clarification, please contact me directly.   Final Clinical Impressions(s) / ED Diagnoses   Final diagnoses:  Dizziness    New Prescriptions New Prescriptions   MECLIZINE (ANTIVERT) 25 MG TABLET    Take 1 tablet (25 mg total) by mouth 3 (three) times daily as needed for dizziness.   ONDANSETRON (ZOFRAN) 4 MG TABLET    Take 1 tablet (4 mg total) by mouth every 8 (eight) hours as needed for nausea or vomiting.     Valda Lamb, MD 03/23/17 Joie Bimler    Jola Schmidt, MD 03/24/17 [phone]

## 2017-03-23 NOTE — ED Notes (Signed)
EDP stated pt is able to eat. Gave pt bag lunch and po fluids. Tolerating both well.

## 2017-03-23 NOTE — ED Notes (Signed)
MD Venora Maples made aware that patient already had 50mg  of meclizine today.  MD said to hold mediation and he will take a look at it

## 2017-03-25 ENCOUNTER — Encounter (HOSPITAL_COMMUNITY): Payer: Self-pay

## 2017-03-25 ENCOUNTER — Emergency Department (HOSPITAL_COMMUNITY)
Admission: EM | Admit: 2017-03-25 | Discharge: 2017-03-25 | Disposition: A | Discharge status: Home / Self Care | Payer: BLUE CROSS/BLUE SHIELD | Attending: Emergency Medicine | Admitting: Emergency Medicine

## 2017-03-25 DIAGNOSIS — Z79899 Other long term (current) drug therapy: Secondary | ICD-10-CM | POA: Insufficient documentation

## 2017-03-25 DIAGNOSIS — Z91040 Latex allergy status: Secondary | ICD-10-CM | POA: Insufficient documentation

## 2017-03-25 DIAGNOSIS — R197 Diarrhea, unspecified: Secondary | ICD-10-CM | POA: Insufficient documentation

## 2017-03-25 DIAGNOSIS — R42 Dizziness and giddiness: Secondary | ICD-10-CM

## 2017-03-25 LAB — BASIC METABOLIC PANEL
ANION GAP: 10 (ref 5–15)
BUN: 9 mg/dL (ref 6–20)
CALCIUM: 8.9 mg/dL (ref 8.9–10.3)
CO2: 24 mmol/L (ref 22–32)
Chloride: 104 mmol/L (ref 101–111)
Creatinine, Ser: 0.85 mg/dL (ref 0.44–1.00)
Glucose, Bld: 116 mg/dL — ABNORMAL HIGH (ref 65–99)
Potassium: 6.1 mmol/L — ABNORMAL HIGH (ref 3.5–5.1)
Sodium: 138 mmol/L (ref 135–145)

## 2017-03-25 LAB — CBC WITH DIFFERENTIAL/PLATELET
BASOS ABS: 0 10*3/uL (ref 0.0–0.1)
BASOS PCT: 0 %
Eosinophils Absolute: 0.1 10*3/uL (ref 0.0–0.7)
Eosinophils Relative: 1 %
HEMATOCRIT: 41.1 % (ref 36.0–46.0)
Hemoglobin: 14 g/dL (ref 12.0–15.0)
Lymphocytes Relative: 30 %
Lymphs Abs: 2 10*3/uL (ref 0.7–4.0)
MCH: 30.3 pg (ref 26.0–34.0)
MCHC: 34.1 g/dL (ref 30.0–36.0)
MCV: 89 fL (ref 78.0–100.0)
MONO ABS: 0.4 10*3/uL (ref 0.1–1.0)
Monocytes Relative: 6 %
NEUTROS ABS: 4.2 10*3/uL (ref 1.7–7.7)
NEUTROS PCT: 63 %
Platelets: 336 10*3/uL (ref 150–400)
RBC: 4.62 MIL/uL (ref 3.87–5.11)
RDW: 13.4 % (ref 11.5–15.5)
WBC: 6.7 10*3/uL (ref 4.0–10.5)

## 2017-03-25 MED ORDER — ONDANSETRON HCL 4 MG/2ML IJ SOLN
4.0000 mg | Freq: Once | INTRAMUSCULAR | Status: AC
  Administered 2017-03-25: 4 mg via INTRAVENOUS
  Filled 2017-03-25: qty 2

## 2017-03-25 MED ORDER — SODIUM CHLORIDE 0.9 % IV BOLUS (SEPSIS)
500.0000 mL | Freq: Once | INTRAVENOUS | Status: AC
  Administered 2017-03-25: 500 mL via INTRAVENOUS

## 2017-03-25 MED ORDER — LORAZEPAM 2 MG/ML IJ SOLN
1.0000 mg | Freq: Once | INTRAMUSCULAR | Status: AC
  Administered 2017-03-25: 1 mg via INTRAVENOUS
  Filled 2017-03-25: qty 1

## 2017-03-25 NOTE — ED Provider Notes (Signed)
[facility] DEPT Provider Note: Georgena Spurling, MD, FACEP  CSN: [id number] MRN: [id number] ARRIVAL: 03/25/17 at [place]: [place]  Marie [name] is a 36 y.o. female with a history of vertigo. She was seen 2 days ago for an exacerbation of vertigo. She describes her vertigo sensation that the room is spinning. She was discharged home with Zofran and meclizine. She states the meclizine helps her sleep but she is not sure it helps with the vertigo symptoms. She was feeling better yesterday morning but about 6 PM yesterday her vertigo worsened. She feels pressure in her left ear and feels like the entire left side of her body feels heavy. Movement of the left side of her body causes her dizziness to worsen. Movement of her head worsens her dizziness as does lying supine. There is associated nausea but she is not vomiting currently. She describes her symptoms as severe. She is also had diarrhea yesterday.   Past Medical History:  Diagnosis Date  . GERD (gastroesophageal reflux disease)   . 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 Rheumatoid arthritis ([place])    no current med.  . Sleep apnea    states no CPAP use in 1 month    Past Surgical History:  Procedure Laterality Date  . LAPAROSCOPIC UNILATERAL SALPINGECTOMY Right 09/19/2010  . LAPAROSCOPY  2012   states realignment of right Fallopian tube    Family History  Problem Relation Age of Onset  . Hypertension Mother   . Arthritis Mother   . Sarcoidosis Mother   . Diabetes Other   . Diabetes Maternal Grandmother   . Congestive Heart Failure Maternal Grandmother   . Cancer Maternal Grandfather     Social History  Substance Use Topics  . Smoking status: Never Smoker  . Smokeless tobacco: Never Used  . Alcohol use No    Prior to Admission medications   Medication Sig Start Date End Date Taking? Authorizing Provider  esomeprazole (NEXIUM) 20 MG capsule Take 20 mg by mouth daily  as needed (for acid reflux).     [provider]  etonogestrel (NEXPLANON) 68 MG IMPL implant 68 mg by Subdermal route once.    [provider]  meclizine (ANTIVERT) 25 MG tablet Take 25 mg by mouth 3 (three) times daily as needed for dizziness.    [provider]  meclizine (ANTIVERT) 25 MG tablet Take 1 tablet (25 mg total) by mouth 3 (three) times daily as needed for dizziness. 03/23/17   Valda Lamb, MD  ondansetron (ZOFRAN) 4 MG tablet Take 1 tablet (4 mg total) by mouth every 8 (eight) hours as needed for nausea or vomiting. 03/23/17   Valda Lamb, MD  valACYclovir (VALTREX) 500 MG tablet Take 500 mg by mouth 2 (two) times daily as needed for other. 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   PHYSICAL EXAMINATION  Initial Vital Signs Blood pressure (!) 163/117, pulse 82, temperature 98 F (36.7 C), temperature source Oral, resp. rate 18, SpO2 98 %.  Examination General: Well-developed, well-nourished female in no acute distress; appearance consistent with age of record HENT: normocephalic; atraumatic; TMs normal Eyes: pupils equal, round and reactive to light; extraocular muscles intact; photophobia Neck: supple Heart: regular rate and rhythm Lungs: clear to auscultation bilaterally  Abdomen: soft; nondistended; nontender; no masses or hepatosplenomegaly; bowel sounds present Extremities: No deformity; full range of motion; pulses normal Neurologic: Awake, alert and oriented; motor function intact in upper extremities and symmetric; will not move left lower extremity because she states doing so will make her more dizzy and vomit; no facial droop Skin: Warm and dry Psychiatric: Flat affect   RESULTS  Summary of this visit's results, reviewed by myself:   EKG  Interpretation  Date/Time:    Ventricular Rate:    PR Interval:    QRS Duration:   QT Interval:    QTC Calculation:   R Axis:     Text Interpretation:        Laboratory Studies: Results for orders placed or performed during the hospital encounter of 03/25/17 (from the past 24 hour(s))  CBC with Differential/Platelet     Status: None   Collection Time: 03/25/17  1:23 AM  Result Value Ref Range   WBC 6.7 4.0 - 10.5 K/uL   RBC 4.62 3.87 - 5.11 MIL/uL   Hemoglobin 14.0 12.0 - 15.0 g/dL   HCT 41.1 36.0 - 46.0 %   MCV 89.0 78.0 - 100.0 fL   MCH 30.3 26.0 - 34.0 pg   MCHC 34.1 30.0 - 36.0 g/dL   RDW 13.4 11.5 - 15.5 %   Platelets 336 150 - 400 K/uL   Neutrophils Relative % 63 %   Neutro Abs 4.2 1.7 - 7.7 K/uL   Lymphocytes Relative 30 %   Lymphs Abs 2.0 0.7 - 4.0 K/uL   Monocytes Relative 6 %   Monocytes Absolute 0.4 0.1 - 1.0 K/uL   Eosinophils Relative 1 %   Eosinophils Absolute 0.1 0.0 - 0.7 K/uL   Basophils Relative 0 %   Basophils Absolute 0.0 0.0 - 0.1 K/uL  Basic metabolic panel     Status: Abnormal   Collection Time: 03/25/17  1:23 AM  Result Value Ref Range   Sodium 138 135 - 145 mmol/L   Potassium 6.1 (H) 3.5 - 5.1 mmol/L   Chloride 104 101 - 111 mmol/L   CO2 24 22 - 32 mmol/L   Glucose, Bld 116 (H) 65 - 99 mg/dL   BUN 9 6 - 20 mg/dL   Creatinine, Ser 0.85 0.44 - 1.00 mg/dL   Calcium 8.9 8.9 - 10.3 mg/dL   GFR calc non Af Amer >60 >60 mL/min   GFR calc Af Amer >60 >60 mL/min   Anion gap 10 5 - 15   Imaging Studies: No results found.  ED COURSE  Nursing notes and initial vitals signs, including pulse oximetry, reviewed.  Vitals:   03/25/17 0022  BP: (!) 163/117  Pulse: 82  Resp: 18  Temp: 98 F (36.7 C)  TempSrc: Oral  SpO2: 98%   2:07 AM Patient symptoms resolved with IV Ativan and Zofran. Patient now able to cooperate with neuro exam. Strength in lower extremities is normal and symmetric.  PROCEDURES    ED DIAGNOSES     ICD-10-CM   1.  Vertigo R42        Ramces Shomaker, Jenny Reichmann, MD 03/25/17 [phone]

## 2017-03-25 NOTE — ED Triage Notes (Signed)
Pt complains of dizziness and diarrhea  Pt is scheduled for breast reduction surgery next week

## 2017-03-26 ENCOUNTER — Encounter (HOSPITAL_BASED_OUTPATIENT_CLINIC_OR_DEPARTMENT_OTHER): Payer: Self-pay | Admitting: Anesthesiology

## 2017-03-30 ENCOUNTER — Ambulatory Visit (HOSPITAL_BASED_OUTPATIENT_CLINIC_OR_DEPARTMENT_OTHER): Admission: RE | Admit: 2017-03-30 | Payer: BLUE CROSS/BLUE SHIELD | Source: Ambulatory Visit | Admitting: Specialist

## 2017-03-30 HISTORY — DX: Hypertrophy of breast: N62

## 2017-03-30 HISTORY — DX: Sleep apnea, unspecified: G47.30

## 2017-03-30 SURGERY — MAMMOPLASTY, REDUCTION
Anesthesia: General | Laterality: Bilateral

## 2017-04-10 NOTE — Progress Notes (Signed)
Office Visit Note  Patient: Marie [name]             Date of Birth: Feb 14, 1981           MRN: [id number]             PCP: Wenda Low, MD Referring: Wenda Low, MD Visit Date: 04/16/2017 Occupation: @GUAROCC @    Subjective:   RA meds due to lower immune system and continued sinus infec. Joint pain   History of Present Illness: Marie [name] is a 36 y.o. female with history of sero positive rheumatoid arthritis. She states she did stop methotrexate due to frequent infections. She had multiple sore throats and sinus infections. She has been off methotrexate since April. She had to go to the emergency room with vertigo. She states her blood pressure was elevated and her blood pressure medications were adjusted. She's been having joint pain and discomfort. She's been taking Tylenol for pain. She states her arthritis is tolerable during the summer months. She usually has a flare during the winter months.  Activities of Daily Living:  Patient reports morning stiffness for 5 minutes.   Patient Denies nocturnal pain.  Difficulty dressing/grooming: Denies Difficulty climbing stairs: Denies Difficulty getting out of chair: Denies Difficulty using hands for taps, buttons, cutlery, and/or writing: Denies   Review of Systems  Constitutional: Positive for fatigue. Negative for night sweats, weight gain, weight loss and weakness.  HENT: Negative for mouth sores, trouble swallowing, trouble swallowing, mouth dryness and nose dryness.   Eyes: Negative for pain, redness, visual disturbance and dryness.  Respiratory: Negative for cough, shortness of breath and difficulty breathing.   Cardiovascular: Positive for hypertension. Negative for chest pain, palpitations, irregular heartbeat and swelling in legs/feet.  Gastrointestinal: Negative for blood in stool, constipation and diarrhea.  Endocrine: Negative for increased urination.  Genitourinary: Negative for vaginal dryness.    Musculoskeletal: Positive for arthralgias, joint pain and morning stiffness. Negative for joint swelling, myalgias, muscle weakness, muscle tenderness and myalgias.  Skin: Negative for color change, rash, hair loss, skin tightness, ulcers and sensitivity to sunlight.  Allergic/Immunologic: Negative for susceptible to infections.  Neurological: Negative for dizziness, memory loss and night sweats.  Hematological: Negative for swollen glands.  Psychiatric/Behavioral: Negative for depressed mood and sleep disturbance. The patient is not nervous/anxious.     PMFS History:  Patient Active Problem List   Diagnosis Date Noted  . Seropositive rheumatoid arthritis of multiple sites ([place]) 11/16/2016  . History of goiter 11/16/2016  . Family history of sarcoidosis 11/16/2016  . History of gastroesophageal reflux (GERD) 11/03/2016  . Abnormal serum angiotensin-converting enzyme level,has had positive rheumatoid factor, and anti-DNAse B in the past.   11/03/2016  . High serum angiotensin converting enzyme (ACE) 11/03/2016  . Goiter 08/05/2016  . Gastroesophageal reflux disease without esophagitis 08/05/2016    Past Medical History:  Diagnosis Date  . GERD (gastroesophageal reflux disease)   . 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 Rheumatoid arthritis ([place])    no current med.  . Sleep apnea    states no CPAP use in 1 month  . Vertigo 03/22/2014    Family History  Problem Relation Age of Onset  . Hypertension Mother   . Arthritis Mother   . Sarcoidosis Mother   . Diabetes Other   . Diabetes Maternal Grandmother   . Congestive Heart Failure Maternal Grandmother   . Cancer Maternal Grandfather    Past Surgical History:  Procedure Laterality Date  . LAPAROSCOPIC UNILATERAL SALPINGECTOMY Right 09/19/2010  .  LAPAROSCOPY  2012   states realignment of right Fallopian tube   Social History   Social History Narrative  . No narrative on file     Objective: Vital Signs: BP 114/70 (BP Location: Left  Arm, Patient Position: Sitting, Cuff Size: Normal)   Pulse 83   Ht 5\' 2"  (1.575 m)   Wt 225 lb (102.1 kg)   BMI 41.15 kg/m    Physical Exam  Constitutional: She is oriented to person, place, and time. She appears well-developed and well-nourished.  HENT:  Head: Normocephalic and atraumatic.  Eyes: Conjunctivae and EOM are normal.  Neck: Normal range of motion.  Cardiovascular: Normal rate, regular rhythm, normal heart sounds and intact distal pulses.   Pulmonary/Chest: Effort normal and breath sounds normal.  Abdominal: Soft. Bowel sounds are normal.  Lymphadenopathy:    She has no cervical adenopathy.  Neurological: She is alert and oriented to person, place, and time.  Skin: Skin is warm and dry. Capillary refill takes less than 2 seconds.  Psychiatric: She has a normal mood and affect. Her behavior is normal.  Nursing note and vitals reviewed.    Musculoskeletal Exam: C-spine and thoracic lumbar spine good range of motion. Shoulder joints although joints wrist joint MCPs PIPs DIPs are good range of motion. Hip joints knee joints ankles MTPs PIPs DIPs are good range of motion with no synovitis.  CDAI Exam: CDAI Homunculus Exam:   Joint Counts:  CDAI Tender Joint count: 0 CDAI Swollen Joint count: 0  Global Assessments:  Patient Global Assessment: 3 Provider Global Assessment: 3  CDAI Calculated Score: 6    Investigation: No additional findings. CBC Latest Ref Rng & Units 03/25/2017 09/23/2016 08/08/2016  WBC 4.0 - 10.5 K/uL 6.7 4.7 5.7  Hemoglobin 12.0 - 15.0 g/dL 14.0 13.3 13.8  Hematocrit 36.0 - 46.0 % 41.1 40.7 41.1  Platelets 150 - 400 K/uL 336 332 313    CMP Latest Ref Rng & Units 03/25/2017 09/23/2016 08/08/2016  Glucose 65 - 99 mg/dL 116(H) 102(H) 92  BUN 6 - 20 mg/dL 9 12 11   Creatinine 0.44 - 1.00 mg/dL 0.85 0.73 0.66  Sodium 135 - 145 mmol/L 138 141 140  Potassium 3.5 - 5.1 mmol/L 6.1(H) 4.2 4.0  Chloride 101 - 111 mmol/L 104 104 104  CO2 22 - 32 mmol/L 24  28 27   Calcium 8.9 - 10.3 mg/dL 8.9 9.5 9.4  Total Protein 6.1 - 8.1 g/dL - 6.7 7.0  Total Bilirubin 0.2 - 1.2 mg/dL - 0.4 0.3  Alkaline Phos 33 - 115 U/L - 52 63  AST 10 - 30 U/L - 15 16  ALT 6 - 29 U/L - 19 20    Imaging: No results found.  Speciality Comments: No specialty comments available.    Procedures:  No procedures performed Allergies: Shellfish allergy; Red dye; Latex; Sulfa antibiotics; and Sulfamethoxazole   Assessment / Plan:     Visit Diagnoses: Seropositive rheumatoid arthritis of multiple sites ([place]) - RF negative, anti-CCP positive, positive synovitis on ultrasound. She does not have much synovitis today which she continues to have some arthralgias and intermittent discomfort. She's been off methotrexate for 3 months now without any major flares. She's tried Plaquenil in the past and has tolerated that well. Today after detailed discussion and side effects reviewed with sciatica to proceed with Plaquenil. A prescription was given for Plaquenil 200 mg by mouth twice a day Monday to Friday. She has had eye exam in June 2017. I will check  her CMP today future labs will be checked in 3 months and then every 5 months. She's been advised to get yearly eye exam.  High risk medication use - Plan: COMPLETE METABOLIC PANEL WITH GFR, CBC with Differential/Platelet, COMPLETE METABOLIC PANEL WITH GFR  Obesity: Weight loss diet and exercise was discussed.  History of gastroesophageal reflux (GERD)  History of goiter  Family history of sarcoidosis - Positive ACE, chest x-ray negative    Orders: Orders Placed This Encounter  Procedures  . COMPLETE METABOLIC PANEL WITH GFR  . CBC with Differential/Platelet  . COMPLETE METABOLIC PANEL WITH GFR   Meds ordered this encounter  Medications  . hydroxychloroquine (PLAQUENIL) 200 MG tablet    Sig: 1 tablet by mouth twice a day Monday to Friday    Dispense:  120 tablet    Refill:  0    Face-to-face time spent with patient was  30 minutes. 50% of time was spent in counseling and coordination of care.  Follow-Up Instructions: Return in about 5 months (around 09/16/2017) for Rheumatoid arthritis.   Bo Merino, MD  Note - This record has been created using Editor, commissioning.  Chart creation errors have been sought, but may not always  have been located. Such creation errors do not reflect on  the standard of medical care.

## 2017-04-16 ENCOUNTER — Encounter: Payer: Self-pay | Admitting: Rheumatology

## 2017-04-16 ENCOUNTER — Ambulatory Visit (INDEPENDENT_AMBULATORY_CARE_PROVIDER_SITE_OTHER): Payer: BLUE CROSS/BLUE SHIELD | Admitting: Rheumatology

## 2017-04-16 VITALS — BP 114/70 | HR 83 | Ht 62.0 in | Wt 225.0 lb

## 2017-04-16 DIAGNOSIS — Z8639 Personal history of other endocrine, nutritional and metabolic disease: Secondary | ICD-10-CM | POA: Diagnosis not present

## 2017-04-16 DIAGNOSIS — Z8719 Personal history of other diseases of the digestive system: Secondary | ICD-10-CM | POA: Diagnosis not present

## 2017-04-16 DIAGNOSIS — Z79899 Other long term (current) drug therapy: Secondary | ICD-10-CM | POA: Diagnosis not present

## 2017-04-16 DIAGNOSIS — Z832 Family history of diseases of the blood and blood-forming organs and certain disorders involving the immune mechanism: Secondary | ICD-10-CM | POA: Diagnosis not present

## 2017-04-16 DIAGNOSIS — Z6841 Body Mass Index (BMI) 40.0 and over, adult: Secondary | ICD-10-CM | POA: Diagnosis not present

## 2017-04-16 DIAGNOSIS — M0579 Rheumatoid arthritis with rheumatoid factor of multiple sites without organ or systems involvement: Secondary | ICD-10-CM | POA: Diagnosis not present

## 2017-04-16 LAB — COMPLETE METABOLIC PANEL WITH GFR
ALBUMIN: 4.4 g/dL (ref 3.6–5.1)
ALK PHOS: 72 U/L (ref 33–115)
ALT: 17 U/L (ref 6–29)
AST: 12 U/L (ref 10–30)
BILIRUBIN TOTAL: 0.3 mg/dL (ref 0.2–1.2)
BUN: 12 mg/dL (ref 7–25)
CALCIUM: 9.6 mg/dL (ref 8.6–10.2)
CO2: 27 mmol/L (ref 20–31)
CREATININE: 0.82 mg/dL (ref 0.50–1.10)
Chloride: 101 mmol/L (ref 98–110)
GFR, Est African American: >89 mL/min (ref >=60)
GFR, Est Non African American: >89 mL/min (ref >=60)
GLUCOSE: 150 mg/dL — AB (ref 65–99)
Potassium: 4 mmol/L (ref 3.5–5.3)
SODIUM: 140 mmol/L (ref 135–146)
TOTAL PROTEIN: 7.4 g/dL (ref 6.1–8.1)

## 2017-04-16 MED ORDER — HYDROXYCHLOROQUINE SULFATE 200 MG PO TABS: ORAL_TABLET | ORAL | 0 refills | Status: DC

## 2017-04-16 NOTE — Patient Instructions (Addendum)
Standing Labs We placed an order today for your standing lab work.    Please come back and get your standing labs in October and then every 5 months  We have open lab Monday through Friday from 8:30-11:30 AM and 1:30-4 PM at the office of Dr. Bo Merino.   The office is located at [address], [place], [place], [place] [address] No appointment is necessary.   Labs are drawn by [facility].  You may receive a bill from [facility] for your lab work. If you have any questions regarding directions or hours of operation,  please call [phone].    Hydroxychloroquine tablets What is this medicine? HYDROXYCHLOROQUINE (hye drox ee KLOR oh kwin) is used to treat rheumatoid arthritis and systemic lupus erythematosus. It is also used to treat malaria. This medicine may be used for other purposes; ask your health care provider or pharmacist if you have questions. COMMON BRAND NAME(S): Plaquenil, Quineprox What should I tell my health care provider before I take this medicine? They need to know if you have any of these conditions: -diabetes -eye disease, vision problems -G6PD deficiency -history of blood diseases -history of irregular heartbeat -if you often drink alcohol -kidney disease -liver disease -porphyria -psoriasis -seizures -an unusual or allergic reaction to chloroquine, hydroxychloroquine, other medicines, foods, dyes, or preservatives -pregnant or trying to get pregnant -breast-feeding How should I use this medicine? Take this medicine by mouth with a glass of water. Follow the directions on the prescription label. Avoid taking antacids within 4 hours of taking this medicine. It is best to separate these medicines by at least 4 hours. Do not cut, crush or chew this medicine. You can take it with or without food. If it upsets your stomach, take it with food. Take your medicine at regular intervals. Do not take your medicine more often than directed. Take all of your medicine as  directed even if you think you are better. Do not skip doses or stop your medicine early. Talk to your pediatrician regarding the use of this medicine in children. While this drug may be prescribed for selected conditions, precautions do apply. Overdosage: If you think you have taken too much of this medicine contact a poison control center or emergency room at once. NOTE: This medicine is only for you. Do not share this medicine with others. What if I miss a dose? If you miss a dose, take it as soon as you can. If it is almost time for your next dose, take only that dose. Do not take double or extra doses. What may interact with this medicine? Do not take this medicine with any of the following medications: -cisapride -dofetilide -dronedarone -live virus vaccines -penicillamine -pimozide -thioridazine -ziprasidone This medicine may also interact with the following medications: -ampicillin -antacids -cimetidine -cyclosporine -digoxin -medicines for diabetes, like insulin, glipizide, glyburide -medicines for seizures like carbamazepine, phenobarbital, phenytoin -mefloquine -methotrexate -other medicines that prolong the QT interval (cause an abnormal heart rhythm) -praziquantel This list may not describe all possible interactions. Give your health care provider a list of all the medicines, herbs, non-prescription drugs, or dietary supplements you use. Also tell them if you smoke, drink alcohol, or use illegal drugs. Some items may interact with your medicine. What should I watch for while using this medicine? Tell your doctor or healthcare professional if your symptoms do not start to get better or if they get worse. Avoid taking antacids within 4 hours of taking this medicine. It is best to separate  separate these medicines by at least 4 hours. Tell your doctor or health care professional right away if you have any change in your eyesight. Your vision and blood may be tested before and during  use of this medicine. This medicine can make you more sensitive to the sun. Keep out of the sun. If you cannot avoid being in the sun, wear protective clothing and use sunscreen. Do not use sun lamps or tanning beds/booths. What side effects may I notice from receiving this medicine? Side effects that you should report to your doctor or health care professional as soon as possible: -allergic reactions like skin rash, itching or hives, swelling of the face, lips, or tongue -changes in vision -decreased hearing or ringing of the ears -redness, blistering, peeling or loosening of the skin, including inside the mouth -seizures -sensitivity to light -signs and symptoms of a dangerous change in heartbeat or heart rhythm like chest pain; dizziness; fast or irregular heartbeat; palpitations; feeling faint or lightheaded, falls; breathing problems -signs and symptoms of liver injury like dark yellow or brown urine; general ill feeling or flu-like symptoms; light-colored stools; loss of appetite; nausea; right upper belly pain; unusually weak or tired; yellowing of the eyes or skin -signs and symptoms of low blood sugar such as feeling anxious; confusion; dizziness; increased hunger; unusually weak or tired; sweating; shakiness; cold; irritable; headache; blurred vision; fast heartbeat; loss of consciousness -uncontrollable head, mouth, neck, arm, or leg movements Side effects that usually do not require medical attention (report to your doctor or health care professional if they continue or are bothersome): -anxious -diarrhea -dizziness -hair loss -headache -irritable -loss of appetite -nausea, vomiting -stomach pain This list may not describe all possible side effects. Call your doctor for medical advice about side effects. You may report side effects to FDA at 1-800-FDA-1088. Where should I keep my medicine? Keep out of the reach of children. In children, this medicine can cause overdose with  small doses. Store at room temperature between 15 and 30 degrees C (59 and 86 degrees F). Protect from moisture and light. Throw away any unused medicine after the expiration date. NOTE: This sheet is a summary. It may not cover all possible information. If you have questions about this medicine, talk to your doctor, pharmacist, or health care provider.  2018 Elsevier/Gold Standard (2016-04-23 14:16:15)  

## 2017-04-16 NOTE — Progress Notes (Signed)
Pharmacy Note  Subjective: Patient presents today to the [facility] to see Dr. Estanislado Pandy.  Patient seen by the pharmacist for counseling on hydroxychloroquine.    Objective: CMP Latest Ref Rng & Units 03/25/2017 09/23/2016 08/08/2016  Glucose 65 - 99 mg/dL 116(H) 102(H) 92  BUN 6 - 20 mg/dL 9 12 11   Creatinine 0.44 - 1.00 mg/dL 0.85 0.73 0.66  Sodium 135 - 145 mmol/L 138 141 140  Potassium 3.5 - 5.1 mmol/L 6.1(H) 4.2 4.0  Chloride 101 - 111 mmol/L 104 104 104  CO2 22 - 32 mmol/L 24 28 27   Calcium 8.9 - 10.3 mg/dL 8.9 9.5 9.4  Total Protein 6.1 - 8.1 g/dL - 6.7 7.0  Total Bilirubin 0.2 - 1.2 mg/dL - 0.4 0.3  Alkaline Phos 33 - 115 U/L - 52 63  AST 10 - 30 U/L - 15 16  ALT 6 - 29 U/L - 19 20    CBC    Component Value Date/Time   WBC 6.7 03/25/2017 0123   RBC 4.62 03/25/2017 0123   HGB 14.0 03/25/2017 0123   HCT 41.1 03/25/2017 0123   PLT 336 03/25/2017 0123   MCV 89.0 03/25/2017 0123   MCH 30.3 03/25/2017 0123   MCHC 34.1 03/25/2017 0123   RDW 13.4 03/25/2017 0123   LYMPHSABS 2.0 03/25/2017 0123   MONOABS 0.4 03/25/2017 0123   EOSABS 0.1 03/25/2017 0123   BASOSABS 0.0 03/25/2017 0123    Assessment/Plan: Patient is being restarted on hydroxychloroquine 200 mg BID Monday through Friday.  Patient was counseled on the purpose, proper use, and adverse effects of hydroxychloroquine including nausea/diarrhea, skin rash, headaches, and sun sensitivity.  Discussed importance of annual eye exams while on hydroxychloroquine to monitor to ocular toxicity and discussed importance of frequent laboratory monitoring.  Provided patient with eye exam form for baseline ophthalmologic exam and standing lab instructions.  Provided patient with educational materials on hydroxychloroquine and answered all questions.  Patient consented to hydroxychloroquine in February 2017.   Elisabeth Most, Pharm.D., BCPS Clinical Pharmacist Pager: [phone] Phone: [phone] 04/16/2017 1:59  PM

## 2017-04-17 NOTE — Progress Notes (Signed)
Glucose is elevated. Please notify patient.

## 2017-05-20 ENCOUNTER — Other Ambulatory Visit: Payer: Self-pay | Admitting: Obstetrics and Gynecology

## 2017-05-20 ENCOUNTER — Other Ambulatory Visit (HOSPITAL_COMMUNITY)
Admission: RE | Admit: 2017-05-20 | Discharge: 2017-05-20 | Disposition: A | Discharge status: Home / Self Care | Payer: BLUE CROSS/BLUE SHIELD | Source: Ambulatory Visit | Attending: Obstetrics and Gynecology | Admitting: Obstetrics and Gynecology

## 2017-05-20 DIAGNOSIS — Z124 Encounter for screening for malignant neoplasm of cervix: Secondary | ICD-10-CM | POA: Insufficient documentation

## 2017-05-21 LAB — CYTOLOGY - PAP
Diagnosis: NEGATIVE
HPV (WINDOPATH): NOT DETECTED

## 2017-06-29 ENCOUNTER — Encounter (HOSPITAL_BASED_OUTPATIENT_CLINIC_OR_DEPARTMENT_OTHER): Payer: Self-pay

## 2017-06-29 ENCOUNTER — Ambulatory Visit (HOSPITAL_BASED_OUTPATIENT_CLINIC_OR_DEPARTMENT_OTHER): Admit: 2017-06-29 | Payer: BLUE CROSS/BLUE SHIELD | Admitting: Specialist

## 2017-06-29 SURGERY — MAMMOPLASTY, REDUCTION
Anesthesia: General | Laterality: Bilateral

## 2017-06-29 NOTE — H&P (Signed)
Marie [name] is an 36 y.o. female.   Chief Complaint: Increased back and shoulder pain sec severe mastia HPI: Increased back and shoulder pain secondary nto severe macromastia  Hx of intertrigo and shoulder pitting  Past Medical History:  Diagnosis Date  . GERD (gastroesophageal reflux disease)   . 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 Rheumatoid arthritis ([place])    no current med.  . Sleep apnea    states no CPAP use in 1 month  . Vertigo 03/22/2014    Past Surgical History:  Procedure Laterality Date  . LAPAROSCOPIC UNILATERAL SALPINGECTOMY Right 09/19/2010  . LAPAROSCOPY  2012   states realignment of right Fallopian tube    Family History  Problem Relation Age of Onset  . Hypertension Mother   . Arthritis Mother   . Sarcoidosis Mother   . Diabetes Other   . Diabetes Maternal Grandmother   . Congestive Heart Failure Maternal Grandmother   . Cancer Maternal Grandfather    Social History:  reports that she has never smoked. She has never used smokeless tobacco. She reports that she does not drink alcohol or use drugs.  Allergies:  Allergies  Allergen Reactions  . Shellfish Allergy Anaphylaxis, Hives and Swelling  . Red Dye Hives and Swelling  . Latex Rash  . Sulfa Antibiotics Rash  . Sulfamethoxazole Rash    No prescriptions prior to admission.    No results found for this or any previous visit (from the past 48 hour(s)). No results found.  Review of Systems  Constitutional: Negative.   HENT: Negative.   Eyes: Negative.   Respiratory: Negative.   Cardiovascular: Negative.   Gastrointestinal: Negative.   Genitourinary: Negative.   Musculoskeletal: Positive for back pain and neck pain.  Skin: Positive for itching and rash.  Neurological: Negative.   Endo/Heme/Allergies: Negative.   Psychiatric/Behavioral: Negative.     There were no vitals taken for this visit. Physical Exam   Assessment/Plan Severe macromastia for bilateral breast reductuions and excision of  accessory breast tissue  Marie [name] L, MD 06/29/2017, 8:28 AM

## 2017-06-30 ENCOUNTER — Encounter (HOSPITAL_BASED_OUTPATIENT_CLINIC_OR_DEPARTMENT_OTHER): Payer: Self-pay | Admitting: *Deleted

## 2017-06-30 NOTE — Pre-Procedure Instructions (Addendum)
To come for BMET and EKG.  History discussed with Dr. Royce Macadamia; pt. OK to come for surgery.

## 2017-07-02 ENCOUNTER — Encounter (HOSPITAL_BASED_OUTPATIENT_CLINIC_OR_DEPARTMENT_OTHER)
Admission: RE | Admit: 2017-07-02 | Discharge: 2017-07-02 | Disposition: A | Discharge status: Home / Self Care | Payer: BLUE CROSS/BLUE SHIELD | Source: Ambulatory Visit | Attending: Specialist | Admitting: Specialist

## 2017-07-02 DIAGNOSIS — I10 Essential (primary) hypertension: Secondary | ICD-10-CM | POA: Diagnosis present

## 2017-07-02 DIAGNOSIS — Z79899 Other long term (current) drug therapy: Secondary | ICD-10-CM | POA: Diagnosis not present

## 2017-07-02 DIAGNOSIS — Z01818 Encounter for other preprocedural examination: Secondary | ICD-10-CM | POA: Insufficient documentation

## 2017-07-02 LAB — BASIC METABOLIC PANEL
ANION GAP: 8 (ref 5–15)
BUN: 8 mg/dL (ref 6–20)
CALCIUM: 9.3 mg/dL (ref 8.9–10.3)
CO2: 26 mmol/L (ref 22–32)
CREATININE: 0.78 mg/dL (ref 0.44–1.00)
Chloride: 104 mmol/L (ref 101–111)
Glucose, Bld: 129 mg/dL — ABNORMAL HIGH (ref 65–99)
Potassium: 3.9 mmol/L (ref 3.5–5.1)
SODIUM: 138 mmol/L (ref 135–145)

## 2017-07-03 NOTE — OR Nursing (Signed)
Dr. Ulyses Amor office closed today without power or intranet access. No orders in for 10/15 surgery. H & P in. SKW

## 2017-07-06 ENCOUNTER — Ambulatory Visit (HOSPITAL_BASED_OUTPATIENT_CLINIC_OR_DEPARTMENT_OTHER)
Admission: RE | Admit: 2017-07-06 | Discharge: 2017-07-06 | Disposition: A | Discharge status: Home / Self Care | Payer: BLUE CROSS/BLUE SHIELD | Source: Ambulatory Visit | Attending: Specialist | Admitting: Specialist

## 2017-07-06 ENCOUNTER — Ambulatory Visit (HOSPITAL_BASED_OUTPATIENT_CLINIC_OR_DEPARTMENT_OTHER): Payer: BLUE CROSS/BLUE SHIELD | Admitting: Anesthesiology

## 2017-07-06 ENCOUNTER — Encounter (HOSPITAL_BASED_OUTPATIENT_CLINIC_OR_DEPARTMENT_OTHER)
Admission: RE | Disposition: A | Discharge status: Home / Self Care | Payer: Self-pay | Source: Ambulatory Visit | Attending: Specialist

## 2017-07-06 ENCOUNTER — Encounter (HOSPITAL_BASED_OUTPATIENT_CLINIC_OR_DEPARTMENT_OTHER): Payer: Self-pay | Admitting: *Deleted

## 2017-07-06 DIAGNOSIS — K219 Gastro-esophageal reflux disease without esophagitis: Secondary | ICD-10-CM | POA: Insufficient documentation

## 2017-07-06 DIAGNOSIS — Z79899 Other long term (current) drug therapy: Secondary | ICD-10-CM | POA: Diagnosis not present

## 2017-07-06 DIAGNOSIS — M069 Rheumatoid arthritis, unspecified: Secondary | ICD-10-CM | POA: Insufficient documentation

## 2017-07-06 DIAGNOSIS — L304 Erythema intertrigo: Secondary | ICD-10-CM | POA: Insufficient documentation

## 2017-07-06 DIAGNOSIS — G4730 Sleep apnea, unspecified: Secondary | ICD-10-CM | POA: Diagnosis not present

## 2017-07-06 DIAGNOSIS — N62 Hypertrophy of breast: Secondary | ICD-10-CM | POA: Diagnosis present

## 2017-07-06 DIAGNOSIS — I10 Essential (primary) hypertension: Secondary | ICD-10-CM | POA: Diagnosis not present

## 2017-07-06 HISTORY — PX: BREAST REDUCTION SURGERY: SHX8

## 2017-07-06 HISTORY — DX: Essential (primary) hypertension: I10

## 2017-07-06 HISTORY — DX: Nontoxic goiter, unspecified: E04.9

## 2017-07-06 SURGERY — MAMMOPLASTY, REDUCTION
Anesthesia: General | Site: Breast | Laterality: Bilateral

## 2017-07-06 MED ORDER — EPINEPHRINE PF 1 MG/ML IJ SOLN
INTRAMUSCULAR | Status: DC | PRN
  Administered 2017-07-06: 1 mg via SUBCUTANEOUS

## 2017-07-06 MED ORDER — DEXAMETHASONE SODIUM PHOSPHATE 4 MG/ML IJ SOLN
INTRAMUSCULAR | Status: DC | PRN
  Administered 2017-07-06: 10 mg via INTRAVENOUS

## 2017-07-06 MED ORDER — LIDOCAINE HCL 2 % IJ SOLN
INTRAMUSCULAR | Status: DC | PRN
  Administered 2017-07-06: 100 mL

## 2017-07-06 MED ORDER — MIDAZOLAM HCL 5 MG/5ML IJ SOLN
INTRAMUSCULAR | Status: DC | PRN
  Administered 2017-07-06: 2 mg via INTRAVENOUS

## 2017-07-06 MED ORDER — CHLORHEXIDINE GLUCONATE CLOTH 2 % EX PADS: 6.0000 | MEDICATED_PAD | Freq: Once | CUTANEOUS | Status: DC

## 2017-07-06 MED ORDER — NALOXONE HCL 0.4 MG/ML IJ SOLN
INTRAMUSCULAR | Status: AC
Start: 2017-07-06 — End: 2017-07-06
  Filled 2017-07-06: qty 1

## 2017-07-06 MED ORDER — MEPERIDINE HCL 25 MG/ML IJ SOLN: 6.2500 mg | INTRAMUSCULAR | Status: DC | PRN

## 2017-07-06 MED ORDER — FENTANYL CITRATE (PF) 100 MCG/2ML IJ SOLN
INTRAMUSCULAR | Status: AC
  Filled 2017-07-06: qty 2

## 2017-07-06 MED ORDER — DEXTROSE 5 % IV SOLN
INTRAVENOUS | Status: DC | PRN
  Administered 2017-07-06: 25 ug/min via INTRAVENOUS

## 2017-07-06 MED ORDER — LIDOCAINE-EPINEPHRINE 0.5 %-1:200000 IJ SOLN
INTRAMUSCULAR | Status: AC
  Filled 2017-07-06: qty 2

## 2017-07-06 MED ORDER — CEFAZOLIN SODIUM-DEXTROSE 2-4 GM/100ML-% IV SOLN: 2.0000 g | INTRAVENOUS | Status: DC

## 2017-07-06 MED ORDER — LIDOCAINE 2% (20 MG/ML) 5 ML SYRINGE
INTRAMUSCULAR | Status: AC
  Filled 2017-07-06: qty 5

## 2017-07-06 MED ORDER — EPINEPHRINE 30 MG/30ML IJ SOLN
INTRAMUSCULAR | Status: AC
  Filled 2017-07-06: qty 1

## 2017-07-06 MED ORDER — PHENYLEPHRINE HCL 10 MG/ML IJ SOLN
INTRAMUSCULAR | Status: DC | PRN
  Administered 2017-07-06: 80 ug via INTRAVENOUS

## 2017-07-06 MED ORDER — ONDANSETRON HCL 4 MG/2ML IJ SOLN
INTRAMUSCULAR | Status: AC
  Filled 2017-07-06: qty 2

## 2017-07-06 MED ORDER — MIDAZOLAM HCL 2 MG/2ML IJ SOLN: 0.5000 mg | Freq: Once | INTRAMUSCULAR | Status: DC | PRN

## 2017-07-06 MED ORDER — PROMETHAZINE HCL 25 MG/ML IJ SOLN: 6.2500 mg | INTRAMUSCULAR | Status: DC | PRN

## 2017-07-06 MED ORDER — PROPOFOL 10 MG/ML IV BOLUS
INTRAVENOUS | Status: DC | PRN
  Administered 2017-07-06 (×5): 50 mg via INTRAVENOUS
  Administered 2017-07-06: 200 mg via INTRAVENOUS

## 2017-07-06 MED ORDER — BUPIVACAINE HCL (PF) 0.5 % IJ SOLN
INTRAMUSCULAR | Status: AC
Start: 2017-07-06 — End: 2017-07-06
  Filled 2017-07-06: qty 60

## 2017-07-06 MED ORDER — PROPOFOL 500 MG/50ML IV EMUL
INTRAVENOUS | Status: AC
  Filled 2017-07-06: qty 50

## 2017-07-06 MED ORDER — FENTANYL CITRATE (PF) 100 MCG/2ML IJ SOLN
INTRAMUSCULAR | Status: DC | PRN
  Administered 2017-07-06 (×2): 50 ug via INTRAVENOUS
  Administered 2017-07-06: 100 ug via INTRAVENOUS
  Administered 2017-07-06 (×2): 50 ug via INTRAVENOUS

## 2017-07-06 MED ORDER — SODIUM BICARBONATE 4 % IV SOLN
INTRAVENOUS | Status: DC | PRN
  Administered 2017-07-06: 12.5 mL via SUBCUTANEOUS

## 2017-07-06 MED ORDER — SCOPOLAMINE 1 MG/3DAYS TD PT72: 1.0000 | MEDICATED_PATCH | Freq: Once | TRANSDERMAL | Status: DC | PRN

## 2017-07-06 MED ORDER — DEXAMETHASONE SODIUM PHOSPHATE 10 MG/ML IJ SOLN
INTRAMUSCULAR | Status: AC
  Filled 2017-07-06: qty 1

## 2017-07-06 MED ORDER — LIDOCAINE HCL 2 % IJ SOLN
INTRAMUSCULAR | Status: AC
  Filled 2017-07-06: qty 100

## 2017-07-06 MED ORDER — MIDAZOLAM HCL 2 MG/2ML IJ SOLN: 1.0000 mg | INTRAMUSCULAR | Status: DC | PRN

## 2017-07-06 MED ORDER — SUCCINYLCHOLINE CHLORIDE 200 MG/10ML IV SOSY
PREFILLED_SYRINGE | INTRAVENOUS | Status: AC
  Filled 2017-07-06: qty 10

## 2017-07-06 MED ORDER — MIDAZOLAM HCL 2 MG/2ML IJ SOLN
INTRAMUSCULAR | Status: AC
  Filled 2017-07-06: qty 2

## 2017-07-06 MED ORDER — CEFAZOLIN SODIUM-DEXTROSE 2-3 GM-% IV SOLR
INTRAVENOUS | Status: DC | PRN
  Administered 2017-07-06: 2 g via INTRAVENOUS

## 2017-07-06 MED ORDER — FENTANYL CITRATE (PF) 100 MCG/2ML IJ SOLN: 50.0000 ug | INTRAMUSCULAR | Status: DC | PRN

## 2017-07-06 MED ORDER — PROMETHAZINE HCL 25 MG/ML IJ SOLN
6.2500 mg | INTRAMUSCULAR | Status: DC | PRN
  Administered 2017-07-06: 6.25 mg via INTRAVENOUS

## 2017-07-06 MED ORDER — SCOPOLAMINE 1 MG/3DAYS TD PT72: 1.0000 | MEDICATED_PATCH | Freq: Once | TRANSDERMAL | Status: DC

## 2017-07-06 MED ORDER — LACTATED RINGERS IV SOLN
INTRAVENOUS | Status: DC
  Administered 2017-07-06 (×3): via INTRAVENOUS

## 2017-07-06 MED ORDER — LIDOCAINE HCL (CARDIAC) 20 MG/ML IV SOLN
INTRAVENOUS | Status: DC | PRN
  Administered 2017-07-06: 30 mg via INTRAVENOUS

## 2017-07-06 MED ORDER — PROMETHAZINE HCL 25 MG/ML IJ SOLN
INTRAMUSCULAR | Status: AC
  Filled 2017-07-06: qty 1

## 2017-07-06 MED ORDER — HYDROMORPHONE HCL 1 MG/ML IJ SOLN: 0.2500 mg | INTRAMUSCULAR | Status: DC | PRN

## 2017-07-06 MED ORDER — HYDROMORPHONE HCL 1 MG/ML IJ SOLN
0.2500 mg | INTRAMUSCULAR | Status: DC | PRN
Start: 2017-07-06 — End: 2017-07-06

## 2017-07-06 MED ORDER — ONDANSETRON HCL 4 MG/2ML IJ SOLN
INTRAMUSCULAR | Status: DC | PRN
  Administered 2017-07-06: 4 mg via INTRAVENOUS

## 2017-07-06 MED ORDER — SODIUM BICARBONATE 4 % IV SOLN
INTRAVENOUS | Status: AC
  Filled 2017-07-06: qty 5

## 2017-07-06 MED ORDER — PHENYLEPHRINE 40 MCG/ML (10ML) SYRINGE FOR IV PUSH (FOR BLOOD PRESSURE SUPPORT)
PREFILLED_SYRINGE | INTRAVENOUS | Status: AC
  Filled 2017-07-06: qty 10

## 2017-07-06 MED ORDER — CEFAZOLIN SODIUM-DEXTROSE 2-4 GM/100ML-% IV SOLN
INTRAVENOUS | Status: AC
  Filled 2017-07-06: qty 100

## 2017-07-06 MED ORDER — PHENYLEPHRINE HCL 10 MG/ML IJ SOLN
INTRAMUSCULAR | Status: AC
  Filled 2017-07-06: qty 1

## 2017-07-06 SURGICAL SUPPLY — 70 items
APL SKNCLS STERI-STRIP NONHPOA (GAUZE/BANDAGES/DRESSINGS) ×2
BAG DECANTER FOR FLEXI CONT (MISCELLANEOUS) ×2 IMPLANT
BENZOIN TINCTURE PRP APPL 2/3 (GAUZE/BANDAGES/DRESSINGS) ×4 IMPLANT
BINDER BREAST XLRG (GAUZE/BANDAGES/DRESSINGS) IMPLANT
BINDER BREAST XXLRG (GAUZE/BANDAGES/DRESSINGS) ×1 IMPLANT
BLADE KNIFE  20 PERSONNA (BLADE) ×3
BLADE KNIFE 20 PERSONNA (BLADE) ×2 IMPLANT
BLADE KNIFE PERSONA 10 (BLADE) IMPLANT
BLADE KNIFE PERSONA 15 (BLADE) ×2 IMPLANT
CANISTER SUCT 1200ML W/VALVE (MISCELLANEOUS) ×2 IMPLANT
CLSR STERI-STRIP ANTIMIC 1/2X4 (GAUZE/BANDAGES/DRESSINGS) ×1 IMPLANT
COVER BACK TABLE 60X90IN (DRAPES) ×2 IMPLANT
COVER MAYO STAND STRL (DRAPES) ×2 IMPLANT
DECANTER SPIKE VIAL GLASS SM (MISCELLANEOUS) ×2 IMPLANT
DRAIN CHANNEL 10F 3/8 F FF (DRAIN) ×4 IMPLANT
DRAPE LAPAROSCOPIC ABDOMINAL (DRAPES) ×2 IMPLANT
DRSG PAD ABDOMINAL 8X10 ST (GAUZE/BANDAGES/DRESSINGS) ×5 IMPLANT
ELECT REM PT RETURN 9FT ADLT (ELECTROSURGICAL) ×6
ELECTRODE REM PT RTRN 9FT ADLT (ELECTROSURGICAL) ×1 IMPLANT
EVACUATOR SILICONE 100CC (DRAIN) ×4 IMPLANT
GAUZE SPONGE 4X4 12PLY STRL (GAUZE/BANDAGES/DRESSINGS) ×4 IMPLANT
GAUZE XEROFORM 5X9 LF (GAUZE/BANDAGES/DRESSINGS) ×4 IMPLANT
GLOVE BIO SURGEON STRL SZ 6.5 (GLOVE) ×1 IMPLANT
GLOVE BIOGEL M STRL SZ7.5 (GLOVE) ×1 IMPLANT
GLOVE BIOGEL PI IND STRL 8 (GLOVE) ×1 IMPLANT
GLOVE BIOGEL PI INDICATOR 8 (GLOVE)
GLOVE ECLIPSE 7.0 STRL STRAW (GLOVE) ×1 IMPLANT
GLOVE SURG SS PI 6.5 STRL IVOR (GLOVE) ×1 IMPLANT
GLOVE SURG SS PI 7.0 STRL IVOR (GLOVE) ×3 IMPLANT
GLOVE SURG SS PI 7.5 STRL IVOR (GLOVE) ×2 IMPLANT
GOWN STRL REUS W/ TWL XL LVL3 (GOWN DISPOSABLE) ×2 IMPLANT
GOWN STRL REUS W/TWL XL LVL3 (GOWN DISPOSABLE) ×6
IV NS 500ML (IV SOLUTION)
IV NS 500ML BAXH (IV SOLUTION) ×1 IMPLANT
MARKER SKIN DUAL TIP RULER LAB (MISCELLANEOUS) ×1 IMPLANT
NDL SPNL 18GX3.5 QUINCKE PK (NEEDLE) ×1 IMPLANT
NEEDLE SPNL 18GX3.5 QUINCKE PK (NEEDLE) ×2 IMPLANT
NS IRRIG 1000ML POUR BTL (IV SOLUTION) IMPLANT
PACK BASIN DAY SURGERY FS (CUSTOM PROCEDURE TRAY) ×2 IMPLANT
PEN SKIN MARKING BROAD TIP (MISCELLANEOUS) ×2 IMPLANT
PILLOW FOAM RUBBER ADULT (PILLOWS) ×2 IMPLANT
PIN SAFETY STERILE (MISCELLANEOUS) ×2 IMPLANT
SHEETING SILICONE GEL EPI DERM (MISCELLANEOUS) IMPLANT
SLEEVE SCD COMPRESS KNEE MED (MISCELLANEOUS) ×2 IMPLANT
SPECIMEN JAR MEDIUM (MISCELLANEOUS) IMPLANT
SPECIMEN JAR X LARGE (MISCELLANEOUS) ×4 IMPLANT
SPONGE LAP 18X18 X RAY DECT (DISPOSABLE) ×9 IMPLANT
STRIP SUTURE WOUND CLOSURE 1/2 (SUTURE) ×10 IMPLANT
SUT MNCRL AB 3-0 PS2 18 (SUTURE) ×12 IMPLANT
SUT MON AB 2-0 CT1 36 (SUTURE) IMPLANT
SUT MON AB 5-0 PS2 18 (SUTURE) ×4 IMPLANT
SUT PROLENE 3 0 PS 2 (SUTURE) ×13 IMPLANT
SUT VLOC 180 0 24IN GS25 (SUTURE) ×1 IMPLANT
SUT VLOC 90 P-14 23 (SUTURE) ×3 IMPLANT
SYR 3ML 18GX1 1/2 (SYRINGE) ×1 IMPLANT
SYR 50ML LL SCALE MARK (SYRINGE) ×5 IMPLANT
SYR CONTROL 10ML LL (SYRINGE) IMPLANT
TAPE HYPAFIX 6X30 (GAUZE/BANDAGES/DRESSINGS) ×2 IMPLANT
TAPE MEASURE 72IN RETRACT (INSTRUMENTS)
TAPE MEASURE LINEN 72IN RETRCT (INSTRUMENTS) IMPLANT
TAPE PAPER 1X10 WHT MICROPORE (GAUZE/BANDAGES/DRESSINGS) ×2 IMPLANT
TAPE PAPER 3X10 WHT MICROPORE (GAUZE/BANDAGES/DRESSINGS) ×1 IMPLANT
TOWEL OR 17X24 6PK STRL BLUE (TOWEL DISPOSABLE) ×5 IMPLANT
TOWEL OR NON WOVEN STRL DISP B (DISPOSABLE) ×1 IMPLANT
TUBE CONNECTING 20X1/4 (TUBING) ×2 IMPLANT
TUBING INFILTRATION IT-10001 (TUBING) ×1 IMPLANT
TUBING SET GRADUATE ASPIR 12FT (MISCELLANEOUS) ×1 IMPLANT
UNDERPAD 30X30 (UNDERPADS AND DIAPERS) ×5 IMPLANT
VAC PENCILS W/TUBING CLEAR (MISCELLANEOUS) ×2 IMPLANT
YANKAUER SUCT BULB TIP NO VENT (SUCTIONS) ×3 IMPLANT

## 2017-07-06 NOTE — Anesthesia Preprocedure Evaluation (Signed)
Anesthesia Evaluation  Patient identified by MRN, date of birth, ID band Patient awake    Reviewed: Allergy & Precautions, NPO status , Patient's Chart, lab work & pertinent test results  History of Anesthesia Complications Negative for: history of anesthetic complications  Airway Mallampati: II  TM Distance: >3 FB Neck ROM: Full    Dental  (+) Dental Advisory Given   Pulmonary neg pulmonary ROS, sleep apnea and Continuous Positive Airway Pressure Ventilation ,    breath sounds clear to auscultation       Cardiovascular hypertension, Pt. on medications (-) angina Rhythm:Regular Rate:Normal     Neuro/Psych negative neurological ROS     GI/Hepatic Neg liver ROS, GERD  Poorly Controlled,  Endo/Other  Morbid obesity  Renal/GU negative Renal ROS     Musculoskeletal  (+) Arthritis ,   Abdominal (+) + obese,   Peds  Hematology   Anesthesia Other Findings   Reproductive/Obstetrics Nexplanon                             Anesthesia Physical Anesthesia Plan  ASA: III  Anesthesia Plan: General   Post-op Pain Management:    Induction: Intravenous  PONV Risk Score and Plan: 4 or greater and Ondansetron, Dexamethasone, Midazolam and Scopolamine patch - Pre-op  Airway Management Planned: Oral ETT  Additional Equipment:   Intra-op Plan:   Post-operative Plan: Extubation in OR  Informed Consent: I have reviewed the patients History and Physical, chart, labs and discussed the procedure including the risks, benefits and alternatives for the proposed anesthesia with the patient or authorized representative who has indicated his/her understanding and acceptance.   Dental advisory given  Plan Discussed with: CRNA and Surgeon  Anesthesia Plan Comments: (Plan routine monitors, GETA)        Anesthesia Quick Evaluation

## 2017-07-06 NOTE — H&P (Signed)
Marie [name] is an 36 y.o. female.   Chief Complaint: Severe macromastia HPI: Increased back and shoulder pain and intertrigo  Past Medical History:  Diagnosis Date  . Enlarged thyroid    no current med.  Marland Kitchen GERD (gastroesophageal reflux disease)   . Hypertension    states under control with meds., has been on med. since 03/2017  . Macromastia 06/2017  . Rheumatoid arthritis ([place])   . Sleep apnea    no CPAP use but did bring CPAP machine with her    Past Surgical History:  Procedure Laterality Date  . CHROMOPERTUBATION  05/30/2011  . HYSTEROSCOPY DIAGNOSTIC  05/30/2011  . LAPAROSCOPIC LYSIS OF ADHESIONS  05/30/2011  . LAPAROSCOPIC UNILATERAL SALPINGECTOMY Right 09/19/2010    Family History  Problem Relation Age of Onset  . Hypertension Mother   . Arthritis Mother   . Sarcoidosis Mother   . Diabetes Other   . Diabetes Maternal Grandmother   . Congestive Heart Failure Maternal Grandmother   . Cancer Maternal Grandfather    Social History:  reports that she has never smoked. She has never used smokeless tobacco. She reports that she does not drink alcohol or use drugs.  Allergies:  Allergies  Allergen Reactions  . Iodine Anaphylaxis  . Red Dye Hives, Shortness Of Breath and Swelling    RED DYE #5  . Shellfish Allergy Anaphylaxis, Hives and Swelling  . Latex Rash  . Sulfa Antibiotics Rash    Medications Prior to Admission  Medication Sig Dispense Refill  . esomeprazole (NEXIUM) 20 MG capsule Take 20 mg by mouth daily at 12 noon.    . hydroxychloroquine (PLAQUENIL) 200 MG tablet 1 tablet by mouth twice a day Monday to Friday 120 tablet 0  . lisinopril-hydrochlorothiazide (PRINZIDE,ZESTORETIC) 10-12.5 MG tablet Take 10-12.5 tablets by mouth daily.  6  . etonogestrel (NEXPLANON) 68 MG IMPL implant 68 mg by Subdermal route once.      No results found for this or any previous visit (from the past 48 hour(s)). No results found.  Review of Systems  Constitutional:  Negative.   HENT: Negative.   Eyes: Negative.   Respiratory: Negative.   Cardiovascular: Negative.   Gastrointestinal: Negative.   Genitourinary: Negative.   Musculoskeletal: Positive for back pain, myalgias and neck pain.  Skin: Positive for rash.  Neurological: Negative.   Endo/Heme/Allergies: Negative.   Psychiatric/Behavioral: Negative.     Blood pressure (!) 141/93, pulse 81, temperature 98.2 F (36.8 C), temperature source Oral, resp. rate 16, height 5\' 2"  (1.575 m), weight 101.6 kg (224 lb), SpO2 100 %. Physical Exam   Assessment/Plan Severe macromastia and accessory breast tissue laterally for bilateral breast reductions and excision of accessory breast tissue  Breckyn Ticas L, MD 07/06/2017, 7:13 AM

## 2017-07-06 NOTE — Discharge Instructions (Signed)
Post Anesthesia Home Care Instructions  Activity: Get plenty of rest for the remainder of the day. A responsible individual must stay with you for 24 hours following the procedure.  For the next 24 hours, DO NOT: -Drive a car -Paediatric nurse -Drink alcoholic beverages -Take any medication unless instructed by your physician -Make any legal decisions or sign important papers.  Meals: Start with liquid foods such as gelatin or soup. Progress to regular foods as tolerated. Avoid greasy, spicy, heavy foods. If nausea and/or vomiting occur, drink only clear liquids until the nausea and/or vomiting subsides. Call your physician if vomiting continues.  Special Instructions/Symptoms: Your throat may feel dry or sore from the anesthesia or the breathing tube placed in your throat during surgery. If this causes discomfort, gargle with warm salt water. The discomfort should disappear within 24 hours.  If you had a scopolamine patch placed behind your ear for the management of post- operative nausea and/or vomiting:  1. The medication in the patch is effective for 72 hours, after which it should be removed.  Wrap patch in a tissue and discard in the trash. Wash hands thoroughly with soap and water. 2. You may remove the patch earlier than 72 hours if you experience unpleasant side effects which may include dry mouth, dizziness or visual disturbances. 3. Avoid touching the patch. Wash your hands with soap and water after contact with the patch.   Breast Reduction Care After Refer to this sheet in the next few weeks. These instructions provide you with information on caring for yourself after your procedure. Your caregiver may also give you more specific instructions. Your treatment has been planned according to current medical practices, but problems sometimes occur. Call your caregiver if you have any problems or questions after your procedure. HOME CARE INSTRUCTIONS  Do not lift more than 5  pounds with one arm, or 10 pounds with both arms, for 1 month.   Do not sleep on your stomach for 4 to 6 weeks.   Do not do vigorous exercise such as bouncing, aerobics, or jumping for 6 weeks. Walking is not restricted.   Do not drive while you are taking prescription pain medicine.   Avoid prolonged sun exposure.   Keep dressings dry and clean  Measure jp drainage every 12 hrs and measure   You may slowly go back to your normal diet. Start with a light meal and increase as comfortable.   You may shower 24 hours after your drains are removed unless instructed differently by your caregiver.   Take your pain medicine as prescribed. Discomfort is normal after breast reduction surgery.   Keep the head of your bed elevated 40 degrees   : Call the office if you notice:  You have a fever.   You notice drainage from the incision that smells bad.   You have persistent pain.   You have persistent bleeding from the incision or nipple discharge.   You develop increased swelling or swelling that is greater in one breast than in the other.  MAKE SURE YOU:   Understand these instructions.   Will watch your condition.   Will get help right away if you are not doing well or get worse.  Document Released: 04/22/2004 Document Revised: 05/21/2011 Document Reviewed: 12/02/2007 [facility] Patient Information 2012 [place].     JP Drain [employer] this sheet to all of your post-operative appointments while you have your drains.  Please measure your drains by CC's or ML's.  Make sure you drain and measure your JP Drains 2 or 3 times per day.  At the end of each day, add up totals for the left side and add up totals for the right side.    ( 9 am )     ( 3 pm )        ( 9 pm )                Date L  R  L  R  L  R  Total L/R

## 2017-07-06 NOTE — Brief Op Note (Signed)
07/06/2017  10:34 AM  PATIENT:  Marie [name]  36 y.o. female  PRE-OPERATIVE DIAGNOSIS:  MICROMASTIA  POST-OPERATIVE DIAGNOSIS:  MICROMASTIA  PROCEDURE:  Procedure(s): MAMMARY REDUCTION  (BREAST) (Bilateral)  SURGEON:  Surgeon(s) and Role:    * Cristine Polio, MD - Primary  PHYSICIAN ASSISTANT:   ASSISTANTS: none   ANESTHESIA:   general  EBL:  Total I/O In: 1600 [I.V.:1600] Out: 75 [Blood:75]  BLOOD ADMINISTERED:none  DRAINS: (rrr) Jackson-Pratt drain(s) with closed bulb suction in the right and left axillary areas #10   LOCAL MEDICATIONS USED:  LIDOCAINE   SPECIMEN:  Excision  DISPOSITION OF SPECIMEN:  PATHOLOGY  COUNTS:  YES  TOURNIQUET:  * No tourniquets in log *  DICTATION: .Other Dictation: Dictation Number [id number]  PLAN OF CARE: Discharge to home after PACU  PATIENT DISPOSITION:  PACU - hemodynamically stable.   Delay start of Pharmacological VTE agent (>24hrs) due to surgical blood loss or risk of bleeding: yes

## 2017-07-06 NOTE — Anesthesia Procedure Notes (Signed)
Procedure Name: Intubation Date/Time: 07/06/2017 7:47 AM Performed by: Marrianne Mood Pre-anesthesia Checklist: Patient identified, Emergency Drugs available, Suction available, Patient being monitored and Timeout performed Patient Re-evaluated:Patient Re-evaluated prior to induction Oxygen Delivery Method: Circle system utilized Preoxygenation: Pre-oxygenation with 100% oxygen Induction Type: IV induction Ventilation: Mask ventilation without difficulty Laryngoscope Size: Mac and 3 Grade View: Grade II Tube type: Oral Tube size: 7.0 mm Number of attempts: 1 Airway Equipment and Method: Stylet and Oral airway Placement Confirmation: ETT inserted through vocal cords under direct vision,  positive ETCO2 and breath sounds checked- equal and bilateral Secured at: 22 cm Tube secured with: Tape Dental Injury: Teeth and Oropharynx as per pre-operative assessment

## 2017-07-06 NOTE — Anesthesia Postprocedure Evaluation (Signed)
Anesthesia Post Note  Patient: Marie [name]  Procedure(s) Performed: MAMMARY REDUCTION  (BREAST) (Bilateral Breast)     Patient location during evaluation: PACU Anesthesia Type: General Level of consciousness: sedated, patient cooperative and oriented Pain management: pain level controlled Vital Signs Assessment: post-procedure vital signs reviewed and stable Respiratory status: spontaneous breathing, nonlabored ventilation, respiratory function stable and patient connected to nasal cannula oxygen Cardiovascular status: blood pressure returned to baseline and stable Postop Assessment: no apparent nausea or vomiting Anesthetic complications: no    Last Vitals:  Vitals:   07/06/17 1200 07/06/17 1220  BP: 126/73 132/86  Pulse: (!) 104 (!) 105  Resp: 16 16  Temp:  36.7 C  SpO2: 98% 100%    Last Pain:  Vitals:   07/06/17 1220  TempSrc:   PainSc: 0-No pain                 Mael Delap,E. Johnae Friley

## 2017-07-06 NOTE — Transfer of Care (Signed)
Immediate Anesthesia Transfer of Care Note  Patient: Mercy Riding  Procedure(s) Performed: MAMMARY REDUCTION  (BREAST) (Bilateral Breast)  Patient Location: PACU  Anesthesia Type:General  Level of Consciousness: awake and patient cooperative  Airway & Oxygen Therapy: Patient Spontanous Breathing and Patient connected to face mask oxygen  Post-op Assessment: Report given to RN and Post -op Vital signs reviewed and stable  Post vital signs: Reviewed and stable  Last Vitals:  Vitals:   07/06/17 0658  BP: (!) 141/93  Pulse: 81  Resp: 16  Temp: 36.8 C  SpO2: 100%    Last Pain:  Vitals:   07/06/17 0658  TempSrc: Oral      Patients Stated Pain Goal: 3 (77/93/96 8864)  Complications: No apparent anesthesia complications

## 2017-07-07 ENCOUNTER — Telehealth: Payer: Self-pay

## 2017-07-07 ENCOUNTER — Encounter (HOSPITAL_BASED_OUTPATIENT_CLINIC_OR_DEPARTMENT_OTHER): Payer: Self-pay | Admitting: Specialist

## 2017-07-07 NOTE — Telephone Encounter (Signed)
Called patient and advised she should resume PLQ as prescribed, she does not need to hold the PLQ.

## 2017-07-07 NOTE — Telephone Encounter (Signed)
Patient had surgery on Yesterday and after recovery she was told not to take her PLQ.  Stated that she did take it before surgery.  Patient would like to know what she needs to do?  Cb# is [phone].  Please advise.  Thank You.

## 2017-07-07 NOTE — Op Note (Signed)
NAME:  Marie [name], Marie [name]                   ACCOUNT NO.:  MEDICAL RECORD NO.:  [id number]  LOCATION:                                 FACILITY:  PHYSICIAN:  Berneta Sages L. Towanda Malkin, M.D.   DATE OF BIRTH:  DATE OF PROCEDURE:  07/06/2017 DATE OF DISCHARGE:                              OPERATIVE REPORT   A 36 year old lady with severe, severe macromastia, back and shoulder pain secondary to large pendulous breasts, increased intertriginous changes 360 degrees as well as the inferior part of the breast, resistant to multiple conservative additives for __________ powders, different antibiotic ointments, etc.  She has now been prepared for bilateral breast reductions using the inferior pedicle technique, excision of accessory breast tissue __________.  ANESTHESIA:  General.  All procedures in detail were explained to the patient preoperatively, who understands and consents to surgery, understands the possible risks and possible complications.  The patient was sat and drawn for the reduction mammoplasty remarking the nipple-areolar complex from over 35 cm to 22 cm from the suprasternal notch.  She then underwent general anesthesia, intubated orally.  Prep was done to the chest breast areas in a routine fashion using Hibiclens soap and solution, walled off with sterile towels and drapes so as to make a sterile field, the prep going all away laterally and axillary, back areas.  Sterile towels and drapes were placed so as to make a sterile field.  Tumescent solution was injected 500 mL per side for the anesthesia and local.  The wound was scored with a #10 blade.  The skin of the inferior pedicle was de- epithelialized with #20 blades.  Next, medial and lateral fatty dermal pedicles were excised down to underlying fascia.  Hemostasis was maintained with Bovie unit coagulation.  Next, the keloid area was debulked and then excess tissue removed laterally.  Liposuction was fashioned in the axillary  regions with a severe amount of macromastia, removal of 400 mL to 500 mL.  After proper hemostasis, the flaps were transposed and stayed with 3-0 Prolene suture.  Subcutaneous closure was done with 3-0 Monocryl x2 layers with a running subcuticular stitch of 3- 0 Monocryl and 5-0 Monocryl throughout the inverted-T.  The wounds were cleansed.  #10 fully fluted Blake drains were placed in the depths of wound and brought out through the lateral-most portion incisions and secured with 3-0 Prolene.  The same procedure was carried out on both sides.  On the right side, We removed 1907 g.  On the left side, we removed 1862 g.  At the end of the procedure, the nipple-areolar complex was examined showing excellent blood supply.  The patient tolerated the procedures very well.  The patient was taken to Recovery after sterile dressings were placed.     Odella Aquas. Towanda Malkin, M.D.     Elie Confer  D:  07/06/2017  T:  07/06/2017  Job:  [id number]

## 2017-07-16 ENCOUNTER — Other Ambulatory Visit: Payer: Self-pay | Admitting: Rheumatology

## 2017-07-16 NOTE — Telephone Encounter (Signed)
Last Visit: 04/16/17 Next Visit: 09/28/17 Labs: 04/16/17 Elevated glucose PLQ Eye Exam:

## 2017-07-16 NOTE — Telephone Encounter (Signed)
Spoke with patient and she states she does not need a refill at this time.

## 2017-09-16 NOTE — Progress Notes (Deleted)
Office Visit Note  Patient: Marie [name]             Date of Birth: [birth year]           MRN: [id number]             PCP: Wenda Low, MD Referring: Wenda Low, MD Visit Date: 09/28/2017 Occupation: @GUAROCC @    Subjective:  No chief complaint on file.   History of Present Illness: Marie [name] is a 36 y.o. female ***   Activities of Daily Living:  Patient reports morning stiffness for *** {minute/hour:19697}.   Patient {ACTIONS;DENIES/REPORTS:21021675::"Denies"} nocturnal pain.  Difficulty dressing/grooming: {ACTIONS;DENIES/REPORTS:21021675::"Denies"} Difficulty climbing stairs: {ACTIONS;DENIES/REPORTS:21021675::"Denies"} Difficulty getting out of chair: {ACTIONS;DENIES/REPORTS:21021675::"Denies"} Difficulty using hands for taps, buttons, cutlery, and/or writing: {ACTIONS;DENIES/REPORTS:21021675::"Denies"}   No Rheumatology ROS completed.   PMFS History:  Patient Active Problem List   Diagnosis Date Noted  . Seropositive rheumatoid arthritis of multiple sites ([place]) 11/16/2016  . History of goiter 11/16/2016  . Family history of sarcoidosis 11/16/2016  . History of gastroesophageal reflux (GERD) 11/03/2016  . Abnormal serum angiotensin-converting enzyme level,has had positive rheumatoid factor, and anti-DNAse B in the past.   11/03/2016  . High serum angiotensin converting enzyme (ACE) 11/03/2016  . Goiter 08/05/2016  . Gastroesophageal reflux disease without esophagitis 08/05/2016    Past Medical History:  Diagnosis Date  . Enlarged thyroid    no current med.  Marland Kitchen GERD (gastroesophageal reflux disease)   . Hypertension    states under control with meds., has been on med. since 03/2017  . Macromastia 06/2017  . Rheumatoid arthritis ([place])   . Sleep apnea    no CPAP use but did bring CPAP machine with her    Family History  Problem Relation Age of Onset  . Hypertension Mother   . Arthritis Mother   . Sarcoidosis Mother   . Diabetes Other   . Diabetes  Maternal Grandmother   . Congestive Heart Failure Maternal Grandmother   . Cancer Maternal Grandfather    Past Surgical History:  Procedure Laterality Date  . BREAST REDUCTION SURGERY Bilateral 07/06/2017   Procedure: MAMMARY REDUCTION  (BREAST);  Surgeon: Cristine Polio, MD;  Location: [facility];  Service: Plastics;  Laterality: Bilateral;  . CHROMOPERTUBATION  05/30/2011  . HYSTEROSCOPY DIAGNOSTIC  05/30/2011  . LAPAROSCOPIC LYSIS OF ADHESIONS  05/30/2011  . LAPAROSCOPIC UNILATERAL SALPINGECTOMY Right 09/19/2010   Social History   Social History Narrative  . Not on file     Objective: Vital Signs: There were no vitals taken for this visit.   Physical Exam   Musculoskeletal Exam: ***  CDAI Exam: No CDAI exam completed.    Investigation: No additional findings. CBC Latest Ref Rng & Units 03/25/2017 09/23/2016 08/08/2016  WBC 4.0 - 10.5 K/uL 6.7 4.7 5.7  Hemoglobin 12.0 - 15.0 g/dL 14.0 13.3 13.8  Hematocrit 36.0 - 46.0 % 41.1 40.7 41.1  Platelets 150 - 400 K/uL 336 332 313   CMP Latest Ref Rng & Units 07/02/2017 04/16/2017 03/25/2017  Glucose 65 - 99 mg/dL 129(H) 150(H) 116(H)  BUN 6 - 20 mg/dL 8 12 9   Creatinine 0.44 - 1.00 mg/dL 0.78 0.82 0.85  Sodium 135 - 145 mmol/L 138 140 138  Potassium 3.5 - 5.1 mmol/L 3.9 4.0 6.1(H)  Chloride 101 - 111 mmol/L 104 101 104  CO2 22 - 32 mmol/L 26 27 24   Calcium 8.9 - 10.3 mg/dL 9.3 9.6 8.9  Total Protein 6.1 - 8.1 g/dL - 7.4 -  Total Bilirubin 0.2 - 1.2 mg/dL - 0.3 -  Alkaline Phos 33 - 115 U/L - 72 -  AST 10 - 30 U/L - 12 -  ALT 6 - 29 U/L - 17 -    Imaging: No results found.  Speciality Comments: No specialty comments available.    Procedures:  No procedures performed Allergies: Iodine; Red dye; Shellfish allergy; Latex; and Sulfa antibiotics   Assessment / Plan:     Visit Diagnoses: No diagnosis found.    Orders: No orders of the defined types were placed in this encounter.  No orders of the  defined types were placed in this encounter.   Face-to-face time spent with patient was *** minutes. 50% of time was spent in counseling and coordination of care.  Follow-Up Instructions: No Follow-up on file.   Earnestine Mealing, CMA  Note - This record has been created using Editor, commissioning.  Chart creation errors have been sought, but may not always  have been located. Such creation errors do not reflect on  the standard of medical care.

## 2017-09-28 ENCOUNTER — Ambulatory Visit: Payer: BLUE CROSS/BLUE SHIELD | Admitting: Rheumatology

## 2017-11-19 NOTE — Progress Notes (Signed)
Office Visit Note  Patient: Marie [name]             Date of Birth: [birth year]           MRN: [id number]             PCP: Wenda Low, MD Referring: Wenda Low, MD Visit Date: 12/03/2017 Occupation: @GUAROCC @    Subjective:  Bilateral hand pain    History of Present Illness: Marie [name] is a 37 y.o. female with history of seropositive rheumatoid arthritis.  Patient states she has been off of her Plaquenil since January 2019.  She states she ran out of refills and missed her appointment in January.  She states she has been having increased swelling in her joints over the past 1 month.  She states she has occasional knee discomfort especially if standing for prolonged peers of time.  She states her bilateral hands have been swelling and causing pain as well. She reports bilateral wrist swelling.  She states she has also noticed fluid retention in bilateral ankles.  She would like a refill of her Plaquenil 2 mg twice daily Monday through Friday.  She states she had her eye exam in the fall.  She is scheduled her next eye exam.  Due patient and her last eye exam was normal.   Activities of Daily Living:  Patient reports morning stiffness for 0  minutes.   Patient Denies nocturnal pain.  Difficulty dressing/grooming: Denies Difficulty climbing stairs: Denies Difficulty getting out of chair: Reports Difficulty using hands for taps, buttons, cutlery, and/or writing: Denies   Review of Systems  Constitutional: Negative for fatigue, fever and weakness.  HENT: Negative for ear pain, mouth sores, mouth dryness and nose dryness.   Eyes: Negative for pain and dryness.  Respiratory: Negative for cough and shortness of breath.   Cardiovascular: Positive for swelling in legs/feet. Negative for chest pain, palpitations and hypertension.  Gastrointestinal: Negative for blood in stool, constipation, diarrhea and heartburn.  Endocrine: Negative for increased urination.  Genitourinary:  Negative for painful urination and hematuria.  Musculoskeletal: Positive for arthralgias, joint pain, joint swelling and muscle weakness. Negative for myalgias, morning stiffness, muscle tenderness and myalgias.  Skin: Negative for color change, pallor, rash, hair loss, nodules/bumps, redness, skin tightness and sensitivity to sunlight.  Neurological: Negative for dizziness, light-headedness, numbness and headaches.  Hematological: Negative for bruising/bleeding tendency and swollen glands.  Psychiatric/Behavioral: Negative for depressed mood and sleep disturbance. The patient is not nervous/anxious.     PMFS History:  Patient Active Problem List   Diagnosis Date Noted  . Seropositive rheumatoid arthritis of multiple sites ([place]) 11/16/2016  . History of goiter 11/16/2016  . Family history of sarcoidosis 11/16/2016  . History of gastroesophageal reflux (GERD) 11/03/2016  . Abnormal serum angiotensin-converting enzyme level,has had positive rheumatoid factor, and anti-DNAse B in the past.   11/03/2016  . High serum angiotensin converting enzyme (ACE) 11/03/2016  . Goiter 08/05/2016  . Gastroesophageal reflux disease without esophagitis 08/05/2016    Past Medical History:  Diagnosis Date  . Enlarged thyroid    no current med.  Marland Kitchen GERD (gastroesophageal reflux disease)   . Hypertension    states under control with meds., has been on med. since 03/2017  . Macromastia 06/2017  . Rheumatoid arthritis ([place])   . Sleep apnea    no CPAP use but did bring CPAP machine with her  . Vertigo     Family History  Problem Relation Age of Onset  .  Hypertension Mother   . Arthritis Mother   . Sarcoidosis Mother   . Diabetes Other   . Diabetes Maternal Grandmother   . Congestive Heart Failure Maternal Grandmother   . Cancer Maternal Grandfather    Past Surgical History:  Procedure Laterality Date  . BREAST REDUCTION SURGERY Bilateral 07/06/2017   Procedure: MAMMARY REDUCTION  (BREAST);   Surgeon: Cristine Polio, MD;  Location: [facility];  Service: Plastics;  Laterality: Bilateral;  . CHROMOPERTUBATION  05/30/2011  . HYSTEROSCOPY DIAGNOSTIC  05/30/2011  . LAPAROSCOPIC LYSIS OF ADHESIONS  05/30/2011  . LAPAROSCOPIC UNILATERAL SALPINGECTOMY Right 09/19/2010   Social History   Social History Narrative  . Not on file     Objective: Vital Signs: BP (!) 147/100 (BP Location: Left Arm, Patient Position: Sitting, Cuff Size: Normal)   Pulse 73   Resp 18   Ht 5\' 2"  (1.575 m)   Wt 230 lb (104.3 kg)   BMI 42.07 kg/m    Physical Exam  Constitutional: She is oriented to person, place, and time. She appears well-developed and well-nourished.  HENT:  Head: Normocephalic and atraumatic.  Eyes: Conjunctivae and EOM are normal.  Neck: Normal range of motion.  Cardiovascular: Normal rate, regular rhythm, normal heart sounds and intact distal pulses.  Pulmonary/Chest: Effort normal and breath sounds normal.  Abdominal: Soft. Bowel sounds are normal.  Lymphadenopathy:    She has no cervical adenopathy.  Neurological: She is alert and oriented to person, place, and time.  Skin: Skin is warm and dry. Capillary refill takes less than 2 seconds.  Psychiatric: She has a normal mood and affect. Her behavior is normal.  Nursing note and vitals reviewed.    Musculoskeletal Exam: C-spine, thoracic spine, lumbar spine good ROM.  No midline spinal tenderness.  No SI joint tenderness.  Shoulder joints, elbow joints, wrist joints, MCPs, PIPs, DIPs good range of motion with no synovitis.  She has edema in bilateral hands and wrists.  Hip joints, knee joints, ankle joints, MTPs, PIPs, DIPs good range of motion with no synovitis.  No warmth or effusion of bilateral knees.  No knee crepitus.  She has pedal edema bilaterally.  CDAI Exam: CDAI Homunculus Exam:   Joint Counts:  CDAI Tender Joint count: 0 CDAI Swollen Joint count: 0  Global Assessments:  Patient Global  Assessment: 7 Provider Global Assessment: 2  CDAI Calculated Score: 9    Investigation: No additional findings. CBC Latest Ref Rng & Units 03/25/2017 09/23/2016 08/08/2016  WBC 4.0 - 10.5 K/uL 6.7 4.7 5.7  Hemoglobin 12.0 - 15.0 g/dL 14.0 13.3 13.8  Hematocrit 36.0 - 46.0 % 41.1 40.7 41.1  Platelets 150 - 400 K/uL 336 332 313   CMP Latest Ref Rng & Units 07/02/2017 04/16/2017 03/25/2017  Glucose 65 - 99 mg/dL 129(H) 150(H) 116(H)  BUN 6 - 20 mg/dL 8 12 9   Creatinine 0.44 - 1.00 mg/dL 0.78 0.82 0.85  Sodium 135 - 145 mmol/L 138 140 138  Potassium 3.5 - 5.1 mmol/L 3.9 4.0 6.1(H)  Chloride 101 - 111 mmol/L 104 101 104  CO2 22 - 32 mmol/L 26 27 24   Calcium 8.9 - 10.3 mg/dL 9.3 9.6 8.9  Total Protein 6.1 - 8.1 g/dL - 7.4 -  Total Bilirubin 0.2 - 1.2 mg/dL - 0.3 -  Alkaline Phos 33 - 115 U/L - 72 -  AST 10 - 30 U/L - 12 -  ALT 6 - 29 U/L - 17 -    Imaging: No results  found.  Speciality Comments: No specialty comments available.    Procedures:  No procedures performed Allergies: Iodine; Red dye; Shellfish allergy; Latex; and Sulfa antibiotics   Assessment / Plan:     Visit Diagnoses: Seropositive rheumatoid arthritis of multiple sites ([place]) - RF negative, anti-CCP positive, positive synovitis on ultrasound: She has no synovitis on exam.  She has complete fist formation.  She has been off of her Plaquenil since January 2019 due to not having refills and missing her appointment.  She is edema of bilateral hands and wrists. She also has pedal edema bilaterally.  She was advised to cut back on her salt intake and to exercise on a regular basis.  She can also try compression compression stockings.  She will continue on Plaquenil 200 mg twice daily. Refill was sent to the pharmacy.  High risk medication use - Plaquenil 200 mg by mouth BID a day Monday to Friday.  She sees Dr. Gershon Crane. Marissa called and spoke with Dr. Kellie Moor office and they are going to fax the results.  Her eye exam in  May 2018 was normal and a yearly eye exam was recommended.  She has her next appointment scheduled in June 2019.  CBC and CMP were drawn today to monitor for drug toxicity.- Plan: CBC with Differential/Platelet, COMPLETE METABOLIC PANEL WITH GFR  Medical conditions are listed as follows:  History of gastroesophageal reflux (GERD)  History of goiter  Family history of sarcoidosis    Orders: Orders Placed This Encounter  Procedures  . CBC with Differential/Platelet  . COMPLETE METABOLIC PANEL WITH GFR   Meds ordered this encounter  Medications  . hydroxychloroquine (PLAQUENIL) 200 MG tablet    Sig: Take 1 tablet (200 mg) twice daily by mouth Monday-Friday only.  Do not take Saturday or Sunday.    Dispense:  40 tablet    Refill:  2      Follow-Up Instructions: Return in about 5 months (around 05/05/2018) for Rheumatoid arthritis.   Ofilia Neas, PA-C   I examined and evaluated the patient with Hazel Sams PA. The plan of care was discussed as noted above.  Bo Merino, MD  Note - This record has been created using Editor, commissioning.  Chart creation errors have been sought, but may not always  have been located. Such creation errors do not reflect on  the standard of medical care.

## 2017-12-03 ENCOUNTER — Ambulatory Visit: Payer: BLUE CROSS/BLUE SHIELD | Admitting: Physician Assistant

## 2017-12-03 ENCOUNTER — Encounter: Payer: Self-pay | Admitting: Physician Assistant

## 2017-12-03 VITALS — BP 147/100 | HR 73 | Resp 18 | Ht 62.0 in | Wt 230.0 lb

## 2017-12-03 DIAGNOSIS — Z832 Family history of diseases of the blood and blood-forming organs and certain disorders involving the immune mechanism: Secondary | ICD-10-CM

## 2017-12-03 DIAGNOSIS — Z8639 Personal history of other endocrine, nutritional and metabolic disease: Secondary | ICD-10-CM | POA: Diagnosis not present

## 2017-12-03 DIAGNOSIS — Z79899 Other long term (current) drug therapy: Secondary | ICD-10-CM | POA: Diagnosis not present

## 2017-12-03 DIAGNOSIS — Z8719 Personal history of other diseases of the digestive system: Secondary | ICD-10-CM | POA: Diagnosis not present

## 2017-12-03 DIAGNOSIS — M0579 Rheumatoid arthritis with rheumatoid factor of multiple sites without organ or systems involvement: Secondary | ICD-10-CM | POA: Diagnosis not present

## 2017-12-03 LAB — CBC WITH DIFFERENTIAL/PLATELET
BASOS PCT: 0.3 %
Basophils Absolute: 18 cells/uL (ref 0–200)
EOS ABS: 162 {cells}/uL (ref 15–500)
Eosinophils Relative: 2.7 %
HEMATOCRIT: 37.6 % (ref 35.0–45.0)
HEMOGLOBIN: 13.2 g/dL (ref 11.7–15.5)
Lymphs Abs: 2172 cells/uL (ref 850–3900)
MCH: 30 pg (ref 27.0–33.0)
MCHC: 35.1 g/dL (ref 32.0–36.0)
MCV: 85.5 fL (ref 80.0–100.0)
MPV: 9.9 fL (ref 7.5–12.5)
Monocytes Relative: 8.3 %
NEUTROS ABS: 3150 {cells}/uL (ref 1500–7800)
Neutrophils Relative %: 52.5 %
Platelets: 347 10*3/uL (ref 140–400)
RBC: 4.4 10*6/uL (ref 3.80–5.10)
RDW: 13.5 % (ref 11.0–15.0)
TOTAL LYMPHOCYTE: 36.2 %
WBC: 6 10*3/uL (ref 3.8–10.8)
WBCMIX: 498 {cells}/uL (ref 200–950)

## 2017-12-03 LAB — COMPLETE METABOLIC PANEL WITH GFR
AG RATIO: 1.8 (calc) (ref 1.0–2.5)
ALKALINE PHOSPHATASE (APISO): 65 U/L (ref 33–115)
ALT: 15 U/L (ref 6–29)
AST: 13 U/L (ref 10–30)
Albumin: 4.2 g/dL (ref 3.6–5.1)
BILIRUBIN TOTAL: 0.2 mg/dL (ref 0.2–1.2)
BUN: 12 mg/dL (ref 7–25)
CALCIUM: 9.2 mg/dL (ref 8.6–10.2)
CO2: 28 mmol/L (ref 20–32)
CREATININE: 0.71 mg/dL (ref 0.50–1.10)
Chloride: 106 mmol/L (ref 98–110)
GFR, Est African American: 127 mL/min/{1.73_m2} (ref >=60)
GFR, Est Non African American: 110 mL/min/{1.73_m2} (ref >=60)
GLOBULIN: 2.3 g/dL (ref 1.9–3.7)
Glucose, Bld: 89 mg/dL (ref 65–99)
Potassium: 3.9 mmol/L (ref 3.5–5.3)
SODIUM: 142 mmol/L (ref 135–146)
TOTAL PROTEIN: 6.5 g/dL (ref 6.1–8.1)

## 2017-12-03 MED ORDER — HYDROXYCHLOROQUINE SULFATE 200 MG PO TABS: ORAL_TABLET | ORAL | 2 refills | Status: DC

## 2017-12-04 NOTE — Progress Notes (Signed)
Labs are WNL.

## 2018-02-26 ENCOUNTER — Other Ambulatory Visit: Payer: Self-pay | Admitting: Physician Assistant

## 2018-02-26 NOTE — Telephone Encounter (Signed)
Last visit: 12/03/17 Next visit: 05/06/18 Labs: 12/03/17 WNL PLQ eye exam: 02/17/2017 Normal.  Okay to refill per Dr. Deveshwar  

## 2018-03-23 ENCOUNTER — Other Ambulatory Visit: Payer: Self-pay | Admitting: Rheumatology

## 2018-03-23 NOTE — Telephone Encounter (Signed)
Last visit: 12/03/17 Next visit: 05/06/18 Labs: 12/03/17 WNL PLQ eye exam: 02/23/2018  Normal.  Okay to refill per Dr. Estanislado Pandy

## 2018-04-20 ENCOUNTER — Other Ambulatory Visit: Payer: Self-pay | Admitting: Rheumatology

## 2018-04-20 NOTE — Telephone Encounter (Signed)
Last visit: 12/03/17 Next visit: 05/06/18 Labs: 12/03/17 WNL PLQ eye exam: 02/17/2017 Normal.  Okay to refill per Dr. Estanislado Pandy

## 2018-04-29 NOTE — Progress Notes (Deleted)
Office Visit Note  Patient: Marie [name]             Date of Birth: 12/26/1980           MRN: [id number]             PCP: Wenda Low, MD Referring: Wenda Low, MD Visit Date: 05/06/2018 Occupation: @GUAROCC @  Subjective:  No chief complaint on file.   History of Present Illness: Marie [name] is a 37 y.o. female ***   Activities of Daily Living:  Patient reports morning stiffness for *** {minute/hour:19697}.   Patient {ACTIONS;DENIES/REPORTS:21021675::"Denies"} nocturnal pain.  Difficulty dressing/grooming: {ACTIONS;DENIES/REPORTS:21021675::"Denies"} Difficulty climbing stairs: {ACTIONS;DENIES/REPORTS:21021675::"Denies"} Difficulty getting out of chair: {ACTIONS;DENIES/REPORTS:21021675::"Denies"} Difficulty using hands for taps, buttons, cutlery, and/or writing: {ACTIONS;DENIES/REPORTS:21021675::"Denies"}  No Rheumatology ROS completed.   PMFS History:  Patient Active Problem List   Diagnosis Date Noted  . Seropositive rheumatoid arthritis of multiple sites ([place]) 11/16/2016  . History of goiter 11/16/2016  . Family history of sarcoidosis 11/16/2016  . History of gastroesophageal reflux (GERD) 11/03/2016  . Abnormal serum angiotensin-converting enzyme level,has had positive rheumatoid factor, and anti-DNAse B in the past.   11/03/2016  . High serum angiotensin converting enzyme (ACE) 11/03/2016  . Goiter 08/05/2016  . Gastroesophageal reflux disease without esophagitis 08/05/2016    Past Medical History:  Diagnosis Date  . Enlarged thyroid    no current med.  Marland Kitchen GERD (gastroesophageal reflux disease)   . Hypertension    states under control with meds., has been on med. since 03/2017  . Macromastia 06/2017  . Rheumatoid arthritis ([place])   . Sleep apnea    no CPAP use but did bring CPAP machine with her  . Vertigo     Family History  Problem Relation Age of Onset  . Hypertension Mother   . Arthritis Mother   . Sarcoidosis Mother   . Diabetes Other   .  Diabetes Maternal Grandmother   . Congestive Heart Failure Maternal Grandmother   . Cancer Maternal Grandfather    Past Surgical History:  Procedure Laterality Date  . BREAST REDUCTION SURGERY Bilateral 07/06/2017   Procedure: MAMMARY REDUCTION  (BREAST);  Surgeon: Cristine Polio, MD;  Location: [facility];  Service: Plastics;  Laterality: Bilateral;  . CHROMOPERTUBATION  05/30/2011  . HYSTEROSCOPY DIAGNOSTIC  05/30/2011  . LAPAROSCOPIC LYSIS OF ADHESIONS  05/30/2011  . LAPAROSCOPIC UNILATERAL SALPINGECTOMY Right 09/19/2010   Social History   Social History Narrative  . Not on file    Objective: Vital Signs: There were no vitals taken for this visit.   Physical Exam   Musculoskeletal Exam: ***  CDAI Exam: CDAI Score: Not documented Patient Global Assessment: Not documented; Provider Global Assessment: Not documented Swollen: Not documented; Tender: Not documented Joint Exam   Not documented   There is currently no information documented on the homunculus. Go to the Rheumatology activity and complete the homunculus joint exam.  Investigation: No additional findings.  Imaging: No results found.  Recent Labs: Lab Results  Component Value Date   WBC 6.0 12/03/2017   HGB 13.2 12/03/2017   PLT 347 12/03/2017   NA 142 12/03/2017   K 3.9 12/03/2017   CL 106 12/03/2017   CO2 28 12/03/2017   GLUCOSE 89 12/03/2017   BUN 12 12/03/2017   CREATININE 0.71 12/03/2017   BILITOT 0.2 12/03/2017   ALKPHOS 72 04/16/2017   AST 13 12/03/2017   ALT 15 12/03/2017   PROT 6.5 12/03/2017   ALBUMIN 4.4 04/16/2017   CALCIUM  9.2 12/03/2017   GFRAA 127 12/03/2017    Speciality Comments: PLQ eye exam: 02/23/2018 Normal. Follow up in 1 year.  Procedures:  No procedures performed Allergies: Iodine; Red dye; Shellfish allergy; Latex; and Sulfa antibiotics   Assessment / Plan:     Visit Diagnoses: No diagnosis found.   Orders: No orders of the defined types were  placed in this encounter.  No orders of the defined types were placed in this encounter.   Face-to-face time spent with patient was *** minutes. Greater than 50% of time was spent in counseling and coordination of care.  Follow-Up Instructions: No follow-ups on file.   Earnestine Mealing, CMA  Note - This record has been created using Editor, commissioning.  Chart creation errors have been sought, but may not always  have been located. Such creation errors do not reflect on  the standard of medical care.

## 2018-05-06 ENCOUNTER — Ambulatory Visit: Payer: BLUE CROSS/BLUE SHIELD | Admitting: Rheumatology

## 2018-05-10 NOTE — Progress Notes (Signed)
Office Visit Note  Patient: Marie [name]             Date of Birth: 1980/10/21           MRN: [id number]             PCP: Wenda Low, MD Referring: Wenda Low, MD Visit Date: 05/13/2018 Occupation: @GUAROCC @  Subjective:  Pain in hands   History of Present Illness: Marie [name] is a 37 y.o. female with history of seropositive rheumatoid arthritis. She is on Plaquenil 200 mg 1 tablet BID M-F.  States she has been having some pain and discomfort in her bilateral hands.  She has intermittent swelling.  Patient describes swelling in the tip of the fingers and not in the joints.  None of the other joints are painful.  Activities of Daily Living:  Patient reports morning stiffness for 10 minutes.   Patient Reports nocturnal pain.  Difficulty dressing/grooming: Denies Difficulty climbing stairs: Denies Difficulty getting out of chair: Denies Difficulty using hands for taps, buttons, cutlery, and/or writing: Reports  Review of Systems  Constitutional: Negative for fatigue, night sweats, weight gain and weight loss.  HENT: Negative for mouth sores, trouble swallowing, trouble swallowing, mouth dryness and nose dryness.   Eyes: Positive for dryness. Negative for pain, redness and visual disturbance.  Respiratory: Negative for cough, hemoptysis, shortness of breath, wheezing and difficulty breathing.   Cardiovascular: Negative for chest pain, palpitations, hypertension, irregular heartbeat and swelling in legs/feet.  Gastrointestinal: Negative for abdominal pain, blood in stool, constipation, diarrhea, nausea and vomiting.  Endocrine: Negative for increased urination.  Genitourinary: Negative for painful urination, pelvic pain and vaginal dryness.  Musculoskeletal: Positive for arthralgias, joint pain, joint swelling and morning stiffness. Negative for myalgias, muscle weakness, muscle tenderness and myalgias.  Skin: Negative for color change, pallor, rash, hair loss,  nodules/bumps, skin tightness, ulcers and sensitivity to sunlight.  Allergic/Immunologic: Negative for susceptible to infections.  Neurological: Negative for dizziness, light-headedness, numbness, headaches, memory loss, night sweats and weakness.  Hematological: Negative for swollen glands.  Psychiatric/Behavioral: Negative for depressed mood, confusion and sleep disturbance. The patient is not nervous/anxious.     PMFS History:  Patient Active Problem List   Diagnosis Date Noted  . Seropositive rheumatoid arthritis of multiple sites ([place]) 11/16/2016  . History of goiter 11/16/2016  . Family history of sarcoidosis 11/16/2016  . History of gastroesophageal reflux (GERD) 11/03/2016  . Abnormal serum angiotensin-converting enzyme level,has had positive rheumatoid factor, and anti-DNAse B in the past.   11/03/2016  . High serum angiotensin converting enzyme (ACE) 11/03/2016  . Goiter 08/05/2016  . Gastroesophageal reflux disease without esophagitis 08/05/2016    Past Medical History:  Diagnosis Date  . Enlarged thyroid    no current med.  Marland Kitchen GERD (gastroesophageal reflux disease)   . Hypertension    states under control with meds., has been on med. since 03/2017  . Macromastia 06/2017  . Rheumatoid arthritis ([place])   . Sleep apnea    no CPAP use but did bring CPAP machine with her  . Vertigo     Family History  Problem Relation Age of Onset  . Hypertension Mother   . Arthritis Mother   . Sarcoidosis Mother   . Diabetes Other   . Diabetes Maternal Grandmother   . Congestive Heart Failure Maternal Grandmother   . Cancer Maternal Grandfather    Past Surgical History:  Procedure Laterality Date  . BREAST REDUCTION SURGERY Bilateral 07/06/2017   Procedure: MAMMARY REDUCTION  (  BREAST);  Surgeon: Cristine Polio, MD;  Location: [facility];  Service: Plastics;  Laterality: Bilateral;  . CHROMOPERTUBATION  05/30/2011  . HYSTEROSCOPY DIAGNOSTIC  05/30/2011  .  LAPAROSCOPIC LYSIS OF ADHESIONS  05/30/2011  . LAPAROSCOPIC UNILATERAL SALPINGECTOMY Right 09/19/2010   Social History   Social History Narrative  . Not on file    Objective: Vital Signs: BP (!) 136/95 (BP Location: Left Arm, Patient Position: Sitting, Cuff Size: Large)   Pulse 73   Resp 16   Ht 5\' 2"  (1.575 m)   Wt 234 lb 12.8 oz (106.5 kg)   BMI 42.95 kg/m    Physical Exam  Constitutional: She is oriented to person, place, and time. She appears well-developed and well-nourished.  HENT:  Head: Normocephalic and atraumatic.  Eyes: Conjunctivae and EOM are normal.  Neck: Normal range of motion.  Cardiovascular: Normal rate, regular rhythm, normal heart sounds and intact distal pulses.  Pulmonary/Chest: Effort normal and breath sounds normal.  Abdominal: Soft. Bowel sounds are normal.  Lymphadenopathy:    She has no cervical adenopathy.  Neurological: She is alert and oriented to person, place, and time.  Skin: Skin is warm and dry. Capillary refill takes less than 2 seconds.  Psychiatric: She has a normal mood and affect. Her behavior is normal.  Nursing note and vitals reviewed.    Musculoskeletal Exam:   CDAI Exam: CDAI Score: 0.4  Patient Global Assessment: 3 (mm); Provider Global Assessment: 1 (mm) Swollen: 0 ; Tender: 0  Joint Exam   Not documented   There is currently no information documented on the homunculus. Go to the Rheumatology activity and complete the homunculus joint exam.  Investigation: No additional findings.  Imaging: No results found.  Recent Labs: Lab Results  Component Value Date   WBC 6.0 12/03/2017   HGB 13.2 12/03/2017   PLT 347 12/03/2017   NA 142 12/03/2017   K 3.9 12/03/2017   CL 106 12/03/2017   CO2 28 12/03/2017   GLUCOSE 89 12/03/2017   BUN 12 12/03/2017   CREATININE 0.71 12/03/2017   BILITOT 0.2 12/03/2017   ALKPHOS 72 04/16/2017   AST 13 12/03/2017   ALT 15 12/03/2017   PROT 6.5 12/03/2017   ALBUMIN 4.4 04/16/2017    CALCIUM 9.2 12/03/2017   GFRAA 127 12/03/2017    Speciality Comments: PLQ eye exam: 02/23/2018 Normal. Follow up in 1 year.  Procedures:  No procedures performed Allergies: Iodine; Red dye; Shellfish allergy; Latex; and Sulfa antibiotics   Assessment / Plan:     Visit Diagnoses: Seropositive rheumatoid arthritis of multiple sites ([place]) - RF negative, anti-CCP positive, positive synovitis on ultrasound the patient had no synovitis on examination today.  High risk medication use - PLQ 200 mg p.o. twice daily Monday through Friday.. eye exam: 02/23/2018 - Plan: CBC with Differential/Platelet, COMPLETE METABOLIC PANEL WITH GFR today and then at follow-up visit.  History of goiter  History of gastroesophageal reflux (GERD)  Family history of sarcoidosis  Class 3 severe obesity due to excess calories without serious comorbidity with body mass index (BMI) of 40.0 to 44.9 in adult (HCC)-weight loss diet and exercise was discussed.  Association of heart disease with rheumatoid arthritis was discussed. Need to monitor blood pressure, cholesterol, and to exercise 30-60 minutes on daily basis was discussed. Poor dental hygiene can be a predisposing factor for rheumatoid arthritis. Good dental hygiene was discussed.  Orders: Orders Placed This Encounter  Procedures  . CBC with Differential/Platelet  . COMPLETE  METABOLIC PANEL WITH GFR   No orders of the defined types were placed in this encounter.    Follow-Up Instructions: Return in about 5 months (around 10/13/2018) for Rheumatoid arthritis.   Bo Merino, MD  Note - This record has been created using Editor, commissioning.  Chart creation errors have been sought, but may not always  have been located. Such creation errors do not reflect on  the standard of medical care.

## 2018-05-13 ENCOUNTER — Ambulatory Visit: Payer: BLUE CROSS/BLUE SHIELD | Admitting: Rheumatology

## 2018-05-13 ENCOUNTER — Encounter: Payer: Self-pay | Admitting: Rheumatology

## 2018-05-13 VITALS — BP 136/95 | HR 73 | Resp 16 | Ht 62.0 in | Wt 234.8 lb

## 2018-05-13 DIAGNOSIS — Z832 Family history of diseases of the blood and blood-forming organs and certain disorders involving the immune mechanism: Secondary | ICD-10-CM

## 2018-05-13 DIAGNOSIS — Z8719 Personal history of other diseases of the digestive system: Secondary | ICD-10-CM | POA: Diagnosis not present

## 2018-05-13 DIAGNOSIS — Z79899 Other long term (current) drug therapy: Secondary | ICD-10-CM | POA: Diagnosis not present

## 2018-05-13 DIAGNOSIS — Z8639 Personal history of other endocrine, nutritional and metabolic disease: Secondary | ICD-10-CM | POA: Diagnosis not present

## 2018-05-13 DIAGNOSIS — M0579 Rheumatoid arthritis with rheumatoid factor of multiple sites without organ or systems involvement: Secondary | ICD-10-CM

## 2018-05-13 DIAGNOSIS — Z6841 Body Mass Index (BMI) 40.0 and over, adult: Secondary | ICD-10-CM

## 2018-05-14 LAB — CBC WITH DIFFERENTIAL/PLATELET
BASOS ABS: 29 {cells}/uL (ref 0–200)
BASOS PCT: 0.7 %
EOS ABS: 88 {cells}/uL (ref 15–500)
EOS PCT: 2.1 %
HCT: 41.2 % (ref 35.0–45.0)
HEMOGLOBIN: 13.7 g/dL (ref 11.7–15.5)
Lymphs Abs: 1672 cells/uL (ref 850–3900)
MCH: 29.8 pg (ref 27.0–33.0)
MCHC: 33.3 g/dL (ref 32.0–36.0)
MCV: 89.8 fL (ref 80.0–100.0)
MPV: 9.7 fL (ref 7.5–12.5)
Monocytes Relative: 9.5 %
NEUTROS ABS: 2012 {cells}/uL (ref 1500–7800)
Neutrophils Relative %: 47.9 %
Platelets: 332 10*3/uL (ref 140–400)
RBC: 4.59 10*6/uL (ref 3.80–5.10)
RDW: 12.8 % (ref 11.0–15.0)
Total Lymphocyte: 39.8 %
WBC mixed population: 399 cells/uL (ref 200–950)
WBC: 4.2 10*3/uL (ref 3.8–10.8)

## 2018-05-14 LAB — COMPLETE METABOLIC PANEL WITH GFR
AG RATIO: 1.6 (calc) (ref 1.0–2.5)
ALKALINE PHOSPHATASE (APISO): 71 U/L (ref 33–115)
ALT: 17 U/L (ref 6–29)
AST: 13 U/L (ref 10–30)
Albumin: 4.4 g/dL (ref 3.6–5.1)
BILIRUBIN TOTAL: 0.2 mg/dL (ref 0.2–1.2)
BUN: 10 mg/dL (ref 7–25)
CHLORIDE: 105 mmol/L (ref 98–110)
CO2: 26 mmol/L (ref 20–32)
Calcium: 9.4 mg/dL (ref 8.6–10.2)
Creat: 0.75 mg/dL (ref 0.50–1.10)
GFR, Est African American: 118 mL/min/{1.73_m2} (ref >=60)
GFR, Est Non African American: 102 mL/min/{1.73_m2} (ref >=60)
GLOBULIN: 2.8 g/dL (ref 1.9–3.7)
Glucose, Bld: 83 mg/dL (ref 65–99)
Potassium: 4.2 mmol/L (ref 3.5–5.3)
SODIUM: 141 mmol/L (ref 135–146)
Total Protein: 7.2 g/dL (ref 6.1–8.1)

## 2018-05-14 NOTE — Progress Notes (Signed)
Labs are WNL.

## 2018-05-17 ENCOUNTER — Other Ambulatory Visit: Payer: Self-pay | Admitting: Rheumatology

## 2018-05-17 NOTE — Telephone Encounter (Signed)
Last Visit: 8/22 Next visit: 10/21/18  Labs :05/13/18 WNL PLQ Eye Exam:  02/23/18 WNL  Okay to refill per Dr. Rob Hickman

## 2018-06-13 ENCOUNTER — Other Ambulatory Visit: Payer: Self-pay | Admitting: Rheumatology

## 2018-06-14 NOTE — Telephone Encounter (Signed)
Last Visit: 8/22 Next visit: 10/21/18  Labs :05/13/18 WNL PLQ Eye Exam:  02/23/18 WNL  Okay to refill per Dr. Rob Hickman

## 2018-07-06 ENCOUNTER — Other Ambulatory Visit: Payer: Self-pay | Admitting: Obstetrics and Gynecology

## 2018-07-06 ENCOUNTER — Other Ambulatory Visit (HOSPITAL_COMMUNITY)
Admission: RE | Admit: 2018-07-06 | Discharge: 2018-07-06 | Disposition: A | Discharge status: Home / Self Care | Payer: BLUE CROSS/BLUE SHIELD | Source: Ambulatory Visit | Attending: Obstetrics and Gynecology | Admitting: Obstetrics and Gynecology

## 2018-07-06 DIAGNOSIS — Z01419 Encounter for gynecological examination (general) (routine) without abnormal findings: Secondary | ICD-10-CM | POA: Diagnosis present

## 2018-07-09 LAB — CYTOLOGY - PAP
DIAGNOSIS: NEGATIVE
HPV: NOT DETECTED

## 2018-07-15 ENCOUNTER — Other Ambulatory Visit: Payer: Self-pay | Admitting: Rheumatology

## 2018-07-15 NOTE — Telephone Encounter (Signed)
Last visit: 05/13/2018 Next visit: 10/21/2018 Labs: 05/13/2018 Labs are WNL. Eye exam: 02/23/2018 WNL   Okay to refill per Dr. Estanislado Pandy.

## 2018-07-29 ENCOUNTER — Other Ambulatory Visit: Payer: Self-pay | Admitting: Rheumatology

## 2018-10-07 NOTE — Progress Notes (Signed)
Office Visit Note  Patient: Marie [name]             Date of Birth: [birth year]           MRN: [id number]             PCP: Wenda Low, MD Referring: Wenda Low, MD Visit Date: 10/21/2018 Occupation: @GUAROCC @  Subjective:  Pain in both feet   History of Present Illness: Marie [name] is a 38 y.o. female with history of seropositive rheumatoid arthritis.  She is taking PLQ 200 mg BID M-F.  She denies missing any doses of PLQ.  She denies any recent infections. She reports pain in both hands and both feet.  She has intermittent joint swelling in both hands.  She reports pain in both elbow joints.  She has occasional bilateral knee pain. She takes Ibuprofen PRN for severe pain.  She states if she sits for prolonged time she experiences pain in both feet that resolves within about 10 minutes.     Activities of Daily Living:  Patient reports morning stiffness for 10 minutes.   Patient Reports nocturnal pain.  Difficulty dressing/grooming: Denies Difficulty climbing stairs: Denies Difficulty getting out of chair: Reports Difficulty using hands for taps, buttons, cutlery, and/or writing: Denies  Review of Systems  Constitutional: Positive for fatigue.  HENT: Negative for mouth sores, mouth dryness and nose dryness.   Eyes: Negative for pain, itching, visual disturbance and dryness.  Respiratory: Negative for cough, hemoptysis, shortness of breath, wheezing and difficulty breathing.   Cardiovascular: Negative for chest pain, palpitations, hypertension and swelling in legs/feet.  Gastrointestinal: Negative for abdominal pain, blood in stool, constipation and diarrhea.  Endocrine: Negative for increased urination.  Genitourinary: Negative for painful urination, nocturia and pelvic pain.  Musculoskeletal: Positive for arthralgias, joint pain and morning stiffness. Negative for joint swelling, myalgias, muscle weakness, muscle tenderness and myalgias.  Skin: Negative for color change,  pallor, rash, hair loss, nodules/bumps, redness, skin tightness, ulcers and sensitivity to sunlight.  Allergic/Immunologic: Negative for susceptible to infections.  Neurological: Negative for dizziness, light-headedness, numbness, headaches, memory loss and weakness.  Hematological: Negative for swollen glands.  Psychiatric/Behavioral: Negative for depressed mood, confusion and sleep disturbance. The patient is not nervous/anxious.     PMFS History:  Patient Active Problem List   Diagnosis Date Noted  . Seropositive rheumatoid arthritis of multiple sites ([place]) 11/16/2016  . History of goiter 11/16/2016  . Family history of sarcoidosis 11/16/2016  . History of gastroesophageal reflux (GERD) 11/03/2016  . Abnormal serum angiotensin-converting enzyme level,has had positive rheumatoid factor, and anti-DNAse B in the past.   11/03/2016  . High serum angiotensin converting enzyme (ACE) 11/03/2016  . Goiter 08/05/2016  . Gastroesophageal reflux disease without esophagitis 08/05/2016    Past Medical History:  Diagnosis Date  . Enlarged thyroid    no current med.  Marland Kitchen GERD (gastroesophageal reflux disease)   . Hypertension    states under control with meds., has been on med. since 03/2017  . Macromastia 06/2017  . Rheumatoid arthritis ([place])   . Sleep apnea    no CPAP use but did bring CPAP machine with her  . Vertigo     Family History  Problem Relation Age of Onset  . Hypertension Mother   . Arthritis Mother   . Sarcoidosis Mother   . Diabetes Other   . Diabetes Maternal Grandmother   . Congestive Heart Failure Maternal Grandmother   . Cancer Maternal Grandfather    Past Surgical  History:  Procedure Laterality Date  . BREAST REDUCTION SURGERY Bilateral 07/06/2017   Procedure: MAMMARY REDUCTION  (BREAST);  Surgeon: Cristine Polio, MD;  Location: [facility];  Service: Plastics;  Laterality: Bilateral;  . CHROMOPERTUBATION  05/30/2011  . HYSTEROSCOPY DIAGNOSTIC   05/30/2011  . LAPAROSCOPIC LYSIS OF ADHESIONS  05/30/2011  . LAPAROSCOPIC UNILATERAL SALPINGECTOMY Right 09/19/2010   Social History   Social History Narrative  . Not on file    There is no immunization history on file for this patient.   Objective: Vital Signs: BP (!) 134/92 (BP Location: Left Arm, Patient Position: Sitting, Cuff Size: Large)   Pulse 90   Resp 16   Ht 5\' 2"  (1.575 m)   Wt 234 lb (106.1 kg)   BMI 42.80 kg/m    Physical Exam Vitals signs and nursing note reviewed.  Constitutional:      Appearance: She is well-developed.  HENT:     Head: Normocephalic and atraumatic.  Eyes:     Conjunctiva/sclera: Conjunctivae normal.  Neck:     Musculoskeletal: Normal range of motion.  Cardiovascular:     Rate and Rhythm: Normal rate and regular rhythm.     Heart sounds: Normal heart sounds.  Pulmonary:     Effort: Pulmonary effort is normal.     Breath sounds: Normal breath sounds.  Abdominal:     General: Bowel sounds are normal.     Palpations: Abdomen is soft.  Lymphadenopathy:     Cervical: No cervical adenopathy.  Skin:    General: Skin is warm and dry.     Capillary Refill: Capillary refill takes less than 2 seconds.  Neurological:     Mental Status: She is alert and oriented to person, place, and time.  Psychiatric:        Behavior: Behavior normal.      Musculoskeletal Exam: C-spine, thoracic spine, lumbar spine good range of motion.  No midline spinal tenderness.  No SI joint tenderness.  Shoulder joints good range of motion with no discomfort.  Elbow joints full range of motion with mild tenderness bilaterally.  Wrist joints, MCPs, PIPs, DIPs good range of motion with no synovitis or tenderness.  She is complete fist formation bilaterally.  Hip joints good range of motion with no discomfort.  No tenderness over trochanteric bursa bilaterally.  Knee joints good range of motion with no warmth or effusion.  Ankle joints good range of motion with no warmth or  effusion.  She has some tenderness of the Achilles tendon.  No plantar fasciitis.  She has tenderness of the first MTP joints bilaterally.  CDAI Exam: CDAI Score: 0.6  Patient Global Assessment: 3 (mm); Provider Global Assessment: 3 (mm) Swollen: 0 ; Tender: 0  Joint Exam   Not documented   There is currently no information documented on the homunculus. Go to the Rheumatology activity and complete the homunculus joint exam.  Investigation: No additional findings.  Imaging: No results found.  Recent Labs: Lab Results  Component Value Date   WBC 4.2 05/13/2018   HGB 13.7 05/13/2018   PLT 332 05/13/2018   NA 141 05/13/2018   K 4.2 05/13/2018   CL 105 05/13/2018   CO2 26 05/13/2018   GLUCOSE 83 05/13/2018   BUN 10 05/13/2018   CREATININE 0.75 05/13/2018   BILITOT 0.2 05/13/2018   ALKPHOS 72 04/16/2017   AST 13 05/13/2018   ALT 17 05/13/2018   PROT 7.2 05/13/2018   ALBUMIN 4.4 04/16/2017   CALCIUM  9.4 05/13/2018   GFRAA 118 05/13/2018    Speciality Comments: PLQ eye exam: 02/23/18 WNL Follow up in 1 year.  Procedures:  No procedures performed Allergies: Iodine; Red dye; Shellfish allergy; Latex; and Sulfa antibiotics   Assessment / Plan:     Visit Diagnoses: Seropositive rheumatoid arthritis of multiple sites ([place]) - RF negative, anti-CCP positive, positive synovitis on ultrasound: She has no synovitis on exam today.  She has not had any recent rheumatoid arthritis flares. She is clinically doing well on Plaquenil 200 mg 1 tablet by mouth BID M-F.  She has not missed any doses recently.  She has not had any recent infections.  She has intermittent pain in both hands and both elbow joints.  She has mild tenderness of both elbow joints but no synovitis and full ROM noted on exam.  She will continue on PLQ 200 mg BID M-F.  She does not need any refills at this time.  She was advised to notify us if she develops increased joint pain or joint swelling.  She will follow up in 5  month.   High risk medication use - PLQ 200 mg p.o. twice daily Monday through Friday. eye exam: 02/23/2018.  CBC and CMP will be drawn today to monitor for drug toxicity.  She will return every 5 months for lab work.  She has not had any recent infections.   - Plan: COMPLETE METABOLIC PANEL WITH GFR, CBC with Differential/Platelet  Achilles tendinitis of both lower extremities: She has achilles tendonitis bilaterally.  She was given a handout of exercises that she can perform at home.   History of goiter  History of gastroesophageal reflux (GERD)  Family history of sarcoidosis   Orders: Orders Placed This Encounter  Procedures  . COMPLETE METABOLIC PANEL WITH GFR  . CBC with Differential/Platelet   No orders of the defined types were placed in this encounter.   Follow-Up Instructions: Return in about 5 months (around 03/22/2019) for Rheumatoid arthritis.   Ofilia Neas, PA-C  Note - This record has been created using Dragon software.  Chart creation errors have been sought, but may not always  have been located. Such creation errors do not reflect on  the standard of medical care.

## 2018-10-21 ENCOUNTER — Ambulatory Visit: Payer: BLUE CROSS/BLUE SHIELD | Admitting: Physician Assistant

## 2018-10-21 ENCOUNTER — Encounter: Payer: Self-pay | Admitting: Physician Assistant

## 2018-10-21 VITALS — BP 134/92 | HR 90 | Resp 16 | Ht 62.0 in | Wt 234.0 lb

## 2018-10-21 DIAGNOSIS — M0579 Rheumatoid arthritis with rheumatoid factor of multiple sites without organ or systems involvement: Secondary | ICD-10-CM | POA: Diagnosis not present

## 2018-10-21 DIAGNOSIS — Z832 Family history of diseases of the blood and blood-forming organs and certain disorders involving the immune mechanism: Secondary | ICD-10-CM

## 2018-10-21 DIAGNOSIS — M7662 Achilles tendinitis, left leg: Secondary | ICD-10-CM

## 2018-10-21 DIAGNOSIS — M7661 Achilles tendinitis, right leg: Secondary | ICD-10-CM | POA: Diagnosis not present

## 2018-10-21 DIAGNOSIS — Z79899 Other long term (current) drug therapy: Secondary | ICD-10-CM

## 2018-10-21 DIAGNOSIS — Z8719 Personal history of other diseases of the digestive system: Secondary | ICD-10-CM

## 2018-10-21 DIAGNOSIS — Z8639 Personal history of other endocrine, nutritional and metabolic disease: Secondary | ICD-10-CM

## 2018-10-21 LAB — COMPLETE METABOLIC PANEL WITH GFR
AG RATIO: 1.3 (calc) (ref 1.0–2.5)
ALKALINE PHOSPHATASE (APISO): 76 U/L (ref 33–115)
ALT: 15 U/L (ref 6–29)
AST: 11 U/L (ref 10–30)
Albumin: 4.3 g/dL (ref 3.6–5.1)
BUN: 10 mg/dL (ref 7–25)
CALCIUM: 10 mg/dL (ref 8.6–10.2)
CO2: 28 mmol/L (ref 20–32)
Chloride: 102 mmol/L (ref 98–110)
Creat: 0.7 mg/dL (ref 0.50–1.10)
GFR, EST NON AFRICAN AMERICAN: 111 mL/min/{1.73_m2} (ref >=60)
GFR, Est African American: 128 mL/min/{1.73_m2} (ref >=60)
GLOBULIN: 3.2 g/dL (ref 1.9–3.7)
Glucose, Bld: 98 mg/dL (ref 65–99)
POTASSIUM: 3.8 mmol/L (ref 3.5–5.3)
SODIUM: 141 mmol/L (ref 135–146)
Total Bilirubin: 0.2 mg/dL (ref 0.2–1.2)
Total Protein: 7.5 g/dL (ref 6.1–8.1)

## 2018-10-21 LAB — CBC WITH DIFFERENTIAL/PLATELET
Absolute Monocytes: 507 cells/uL (ref 200–950)
BASOS PCT: 0.6 %
Basophils Absolute: 39 cells/uL (ref 0–200)
EOS ABS: 130 {cells}/uL (ref 15–500)
Eosinophils Relative: 2 %
HCT: 39.1 % (ref 35.0–45.0)
Hemoglobin: 13.4 g/dL (ref 11.7–15.5)
Lymphs Abs: 2529 cells/uL (ref 850–3900)
MCH: 30 pg (ref 27.0–33.0)
MCHC: 34.3 g/dL (ref 32.0–36.0)
MCV: 87.5 fL (ref 80.0–100.0)
MONOS PCT: 7.8 %
MPV: 10 fL (ref 7.5–12.5)
Neutro Abs: 3296 cells/uL (ref 1500–7800)
Neutrophils Relative %: 50.7 %
PLATELETS: 372 10*3/uL (ref 140–400)
RBC: 4.47 10*6/uL (ref 3.80–5.10)
RDW: 12.6 % (ref 11.0–15.0)
TOTAL LYMPHOCYTE: 38.9 %
WBC: 6.5 10*3/uL (ref 3.8–10.8)

## 2018-10-21 NOTE — Patient Instructions (Signed)

## 2018-10-22 NOTE — Progress Notes (Signed)
CBC and CMP WNL

## 2018-12-29 ENCOUNTER — Telehealth: Payer: Self-pay | Admitting: *Deleted

## 2018-12-29 NOTE — Telephone Encounter (Signed)
Received a Prior Authorization request from [facility] for PLQ. Authorization has been submitted to patient's insurance via Cover My Meds. Will update once we receive a response.

## 2018-12-30 NOTE — Telephone Encounter (Signed)
Received a fax from [employer] regarding a prior authorization for PLQ. Authorization has been APPROVED from 12/29/18 to 12/29/19.   Will send document to scan center.

## 2019-02-18 ENCOUNTER — Other Ambulatory Visit: Payer: Self-pay | Admitting: Rheumatology

## 2019-02-18 NOTE — Telephone Encounter (Signed)
Last Visit: 10/21/18 Next Visit: 03/17/19 Labs: 10/21/18 WNL PLQ eye exam: 02/23/18 WNL   Okay to refill per Dr. Estanislado Pandy

## 2019-03-03 NOTE — Progress Notes (Deleted)
Office Visit Note  Patient: Marie [name]             Date of Birth: 1981-05-04           MRN: [id number]             PCP: Wenda Low, MD Referring: Wenda Low, MD Visit Date: 03/17/2019 Occupation: @GUAROCC @  Subjective:  No chief complaint on file.   History of Present Illness: Marie [name] is a 38 y.o. female ***   Activities of Daily Living:  Patient reports morning stiffness for *** {minute/hour:19697}.   Patient {ACTIONS;DENIES/REPORTS:21021675::"Denies"} nocturnal pain.  Difficulty dressing/grooming: {ACTIONS;DENIES/REPORTS:21021675::"Denies"} Difficulty climbing stairs: {ACTIONS;DENIES/REPORTS:21021675::"Denies"} Difficulty getting out of chair: {ACTIONS;DENIES/REPORTS:21021675::"Denies"} Difficulty using hands for taps, buttons, cutlery, and/or writing: {ACTIONS;DENIES/REPORTS:21021675::"Denies"}  No Rheumatology ROS completed.   PMFS History:  Patient Active Problem List   Diagnosis Date Noted  . Seropositive rheumatoid arthritis of multiple sites ([place]) 11/16/2016  . History of goiter 11/16/2016  . Family history of sarcoidosis 11/16/2016  . History of gastroesophageal reflux (GERD) 11/03/2016  . Abnormal serum angiotensin-converting enzyme level,has had positive rheumatoid factor, and anti-DNAse B in the past.   11/03/2016  . High serum angiotensin converting enzyme (ACE) 11/03/2016  . Goiter 08/05/2016  . Gastroesophageal reflux disease without esophagitis 08/05/2016    Past Medical History:  Diagnosis Date  . Enlarged thyroid    no current med.  Marland Kitchen GERD (gastroesophageal reflux disease)   . Hypertension    states under control with meds., has been on med. since 03/2017  . Macromastia 06/2017  . Rheumatoid arthritis ([place])   . Sleep apnea    no CPAP use but did bring CPAP machine with her  . Vertigo     Family History  Problem Relation Age of Onset  . Hypertension Mother   . Arthritis Mother   . Sarcoidosis Mother   . Diabetes Other   .  Diabetes Maternal Grandmother   . Congestive Heart Failure Maternal Grandmother   . Cancer Maternal Grandfather    Past Surgical History:  Procedure Laterality Date  . BREAST REDUCTION SURGERY Bilateral 07/06/2017   Procedure: MAMMARY REDUCTION  (BREAST);  Surgeon: Cristine Polio, MD;  Location: [place];  Service: Plastics;  Laterality: Bilateral;  . CHROMOPERTUBATION  05/30/2011  . HYSTEROSCOPY DIAGNOSTIC  05/30/2011  . LAPAROSCOPIC LYSIS OF ADHESIONS  05/30/2011  . LAPAROSCOPIC UNILATERAL SALPINGECTOMY Right 09/19/2010   Social History   Social History Narrative  . Not on file    There is no immunization history on file for this patient.   Objective: Vital Signs: There were no vitals taken for this visit.   Physical Exam   Musculoskeletal Exam: ***  CDAI Exam: CDAI Score: - Patient Global: -; Provider Global: - Swollen: -; Tender: - Joint Exam   No joint exam has been documented for this visit   There is currently no information documented on the homunculus. Go to the Rheumatology activity and complete the homunculus joint exam.  Investigation: No additional findings.  Imaging: No results found.  Recent Labs: Lab Results  Component Value Date   WBC 6.5 10/21/2018   HGB 13.4 10/21/2018   PLT 372 10/21/2018   NA 141 10/21/2018   K 3.8 10/21/2018   CL 102 10/21/2018   CO2 28 10/21/2018   GLUCOSE 98 10/21/2018   BUN 10 10/21/2018   CREATININE 0.70 10/21/2018   BILITOT 0.2 10/21/2018   ALKPHOS 72 04/16/2017   AST 11 10/21/2018   ALT 15 10/21/2018  PROT 7.5 10/21/2018   ALBUMIN 4.4 04/16/2017   CALCIUM 10.0 10/21/2018   GFRAA 128 10/21/2018    Speciality Comments: PLQ eye exam: 02/28/2019 WNL. Dr. Gershon Crane. Follow up 1 year.  Procedures:  No procedures performed Allergies: Iodine, Red dye, Shellfish allergy, Latex, and Sulfa antibiotics   Assessment / Plan:     Visit Diagnoses: No diagnosis found.   Orders: No orders of the  defined types were placed in this encounter.  No orders of the defined types were placed in this encounter.   Face-to-face time spent with patient was *** minutes. Greater than 50% of time was spent in counseling and coordination of care.  Follow-Up Instructions: No follow-ups on file.   Ofilia Neas, PA-C  Note - This record has been created using Dragon software.  Chart creation errors have been sought, but may not always  have been located. Such creation errors do not reflect on  the standard of medical care.

## 2019-03-17 ENCOUNTER — Ambulatory Visit: Payer: BLUE CROSS/BLUE SHIELD | Admitting: Physician Assistant

## 2019-03-18 NOTE — Progress Notes (Signed)
Office Visit Note  Patient: Marie [name]             Date of Birth: 10/13/1980           MRN: [id number]             PCP: Wenda Low, MD Referring: Wenda Low, MD Visit Date: 04/01/2019 Occupation: @GUAROCC @  Subjective:  Medication monitoring    History of Present Illness: Marie [name] is a 38 y.o. female with history of seropositive rheumatoid arthritis.  Patient is taking Plaquenil 200 mg 1 tablet by mouth twice daily Monday through Friday.  She has not missed any doses of Plaquenil recently.  She has not had any recent infections.  She denies any recent rheumatoid arthritis flares.  She occasionally has discomfort in both hands but denies any joint swelling.  She has no joint pain or joint swelling at this time.  She has no morning stiffness.  She has no concerns at this time.  She does not need any refills.  Activities of Daily Living:  Patient reports morning stiffness for 0 minutes.   Patient Denies nocturnal pain.  Difficulty dressing/grooming: Denies Difficulty climbing stairs: Denies Difficulty getting out of chair: Denies Difficulty using hands for taps, buttons, cutlery, and/or writing: Denies  Review of Systems  Constitutional: Negative for fatigue.  HENT: Negative for mouth sores, mouth dryness and nose dryness.   Eyes: Negative for pain, itching, visual disturbance and dryness.  Respiratory: Negative for cough, hemoptysis, shortness of breath and difficulty breathing.   Cardiovascular: Negative for chest pain, palpitations, hypertension and swelling in legs/feet.  Gastrointestinal: Negative for abdominal pain, blood in stool, constipation and diarrhea.  Endocrine: Negative for increased urination.  Genitourinary: Negative for painful urination and pelvic pain.  Musculoskeletal: Positive for arthralgias, joint pain and joint swelling. Negative for myalgias, muscle weakness, morning stiffness, muscle tenderness and myalgias.  Skin: Negative for color  change, pallor, rash, hair loss, nodules/bumps, redness, skin tightness, ulcers and sensitivity to sunlight.  Allergic/Immunologic: Negative for susceptible to infections.  Neurological: Negative for dizziness, fainting, numbness, headaches, memory loss and weakness.  Hematological: Positive for bruising/bleeding tendency. Negative for swollen glands.  Psychiatric/Behavioral: Negative for depressed mood, confusion and sleep disturbance. The patient is not nervous/anxious.     PMFS History:  Patient Active Problem List   Diagnosis Date Noted   Seropositive rheumatoid arthritis of multiple sites ([place]) 11/16/2016   History of goiter 11/16/2016   Family history of sarcoidosis 11/16/2016   History of gastroesophageal reflux (GERD) 11/03/2016   Abnormal serum angiotensin-converting enzyme level,has had positive rheumatoid factor, and anti-DNAse B in the past.   11/03/2016   High serum angiotensin converting enzyme (ACE) 11/03/2016   Goiter 08/05/2016   Gastroesophageal reflux disease without esophagitis 08/05/2016    Past Medical History:  Diagnosis Date   Enlarged thyroid    no current med.   GERD (gastroesophageal reflux disease)    Hypertension    states under control with meds., has been on med. since 03/2017   Macromastia 06/2017   Rheumatoid arthritis ([place])    Sleep apnea    no CPAP use but did bring CPAP machine with her   Vertigo     Family History  Problem Relation Age of Onset   Hypertension Mother    Arthritis Mother    Sarcoidosis Mother    Diabetes Other    Diabetes Maternal Grandmother    Congestive Heart Failure Maternal Grandmother    Cancer Maternal Grandfather  Past Surgical History:  Procedure Laterality Date   BREAST REDUCTION SURGERY Bilateral 07/06/2017   Procedure: MAMMARY REDUCTION  (BREAST);  Surgeon: Cristine Polio, MD;  Location: [place];  Service: Plastics;  Laterality: Bilateral;   CHROMOPERTUBATION   05/30/2011   HYSTEROSCOPY DIAGNOSTIC  05/30/2011   LAPAROSCOPIC LYSIS OF ADHESIONS  05/30/2011   LAPAROSCOPIC UNILATERAL SALPINGECTOMY Right 09/19/2010   Social History   Social History Narrative   Not on file    There is no immunization history on file for this patient.   Objective: Vital Signs: BP (!) 153/100 (BP Location: Left Wrist, Patient Position: Sitting, Cuff Size: Normal)    Pulse 72    Resp 14    Ht 5\' 2"  (1.575 m)    Wt 240 lb (108.9 kg)    BMI 43.90 kg/m    Physical Exam Vitals signs and nursing note reviewed.  Constitutional:      Appearance: She is well-developed.  HENT:     Head: Normocephalic and atraumatic.  Eyes:     Conjunctiva/sclera: Conjunctivae normal.  Neck:     Musculoskeletal: Normal range of motion.  Cardiovascular:     Rate and Rhythm: Normal rate and regular rhythm.     Heart sounds: Normal heart sounds.  Pulmonary:     Effort: Pulmonary effort is normal.     Breath sounds: Normal breath sounds.  Abdominal:     General: Bowel sounds are normal.     Palpations: Abdomen is soft.  Lymphadenopathy:     Cervical: No cervical adenopathy.  Skin:    General: Skin is warm and dry.     Capillary Refill: Capillary refill takes less than 2 seconds.  Neurological:     Mental Status: She is alert and oriented to person, place, and time.  Psychiatric:        Behavior: Behavior normal.      Musculoskeletal Exam: C-spine, thoracic spine, lumbar spine good range of motion.  No midline spinal tenderness.  No SI joint tenderness.  Shoulder joints, elbow joints, wrist joints, MCPs, PIPs, DIPs good range of motion no synovitis.  She has complete fist formation bilaterally.  Hip joints, knee joints, ankle joints, MTPs, PIPs, DIPs good range of motion no synovitis.  No warmth or effusion bilateral knee joints.  No tenderness or swelling of ankle joints.  She has pedal edema bilaterally.  CDAI Exam: CDAI Score: 0.4  Patient Global: 2 mm; Provider Global: 2  mm Swollen: 0 ; Tender: 0  Joint Exam   No joint exam has been documented for this visit   There is currently no information documented on the homunculus. Go to the Rheumatology activity and complete the homunculus joint exam.  Investigation: No additional findings.  Imaging: No results found.  Recent Labs: Lab Results  Component Value Date   WBC 6.5 10/21/2018   HGB 13.4 10/21/2018   PLT 372 10/21/2018   NA 141 10/21/2018   K 3.8 10/21/2018   CL 102 10/21/2018   CO2 28 10/21/2018   GLUCOSE 98 10/21/2018   BUN 10 10/21/2018   CREATININE 0.70 10/21/2018   BILITOT 0.2 10/21/2018   ALKPHOS 72 04/16/2017   AST 11 10/21/2018   ALT 15 10/21/2018   PROT 7.5 10/21/2018   ALBUMIN 4.4 04/16/2017   CALCIUM 10.0 10/21/2018   GFRAA 128 10/21/2018    Speciality Comments: PLQ eye exam: 02/28/2019 WNL. Dr. Gershon Crane. Follow up 1 year.  Procedures:  No procedures performed Allergies: Iodine, Red dye, Shellfish  allergy, Latex, and Sulfa antibiotics   Assessment / Plan:     Visit Diagnoses: Seropositive rheumatoid arthritis of multiple sites ([place]) - RF negative, anti-CCP positive, positive synovitis on ultrasound: She has no synovitis on exam.  She has not had any recent rheumatoid arthritis flares.  She has no joint pain or joint swelling at this time.  She is not experiencing any morning stiffness.  She is clinically doing well on Plaquenil 200 mg 1 tablet by mouth twice daily Monday through Friday.  She does not need any refills at this time.  She will continue on this current treatment regimen.  She was advised to notify us if she develops increased joint pain or joint swelling.  She will follow-up in the office in 5 months.  High risk medication use -Plaquenil 200 mg 1 tablet twice daily Monday through Friday only.  Last Plaquenil eye exam normal on 02/28/2019.  Most recent CBC/CMP within normal limits on 10/21/2018.  Due for CBC/CMP today and will monitor every 5 months.  Standing orders  placed.  Recommend annual flu, Prevnar 13, and Pneumovax 23 vaccines as indicated for immunosuppressant therapy. - Plan: CBC with Differential/Platelet, COMPLETE METABOLIC PANEL WITH GFR, CBC with Differential/Platelet, COMPLETE METABOLIC PANEL WITH GFR,  Achilles tendinitis of both lower extremities - Resolved   Other medical conditions are listed as follows:  Family history of sarcoidosis  History of gastroesophageal reflux (GERD)   History of goiter   Orders: Orders Placed This Encounter  Procedures   CBC with Differential/Platelet   COMPLETE METABOLIC PANEL WITH GFR   CBC with Differential/Platelet   COMPLETE METABOLIC PANEL WITH GFR   No orders of the defined types were placed in this encounter.     Follow-Up Instructions: Return in about 5 months (around 09/01/2019) for Rheumatoid arthritis.   Ofilia Neas, PA-C  Note - This record has been created using Dragon software.  Chart creation errors have been sought, but may not always  have been located. Such creation errors do not reflect on  the standard of medical care.

## 2019-03-22 ENCOUNTER — Ambulatory Visit: Payer: Self-pay | Admitting: Physician Assistant

## 2019-04-01 ENCOUNTER — Other Ambulatory Visit: Payer: Self-pay

## 2019-04-01 ENCOUNTER — Encounter: Payer: Self-pay | Admitting: Physician Assistant

## 2019-04-01 ENCOUNTER — Ambulatory Visit: Payer: BC Managed Care – PPO | Admitting: Physician Assistant

## 2019-04-01 VITALS — BP 153/100 | HR 72 | Resp 14 | Ht 62.0 in | Wt 240.0 lb

## 2019-04-01 DIAGNOSIS — M7661 Achilles tendinitis, right leg: Secondary | ICD-10-CM | POA: Diagnosis not present

## 2019-04-01 DIAGNOSIS — M7662 Achilles tendinitis, left leg: Secondary | ICD-10-CM

## 2019-04-01 DIAGNOSIS — Z8639 Personal history of other endocrine, nutritional and metabolic disease: Secondary | ICD-10-CM

## 2019-04-01 DIAGNOSIS — Z79899 Other long term (current) drug therapy: Secondary | ICD-10-CM

## 2019-04-01 DIAGNOSIS — Z832 Family history of diseases of the blood and blood-forming organs and certain disorders involving the immune mechanism: Secondary | ICD-10-CM | POA: Diagnosis not present

## 2019-04-01 DIAGNOSIS — M0579 Rheumatoid arthritis with rheumatoid factor of multiple sites without organ or systems involvement: Secondary | ICD-10-CM

## 2019-04-01 DIAGNOSIS — Z8719 Personal history of other diseases of the digestive system: Secondary | ICD-10-CM

## 2019-04-02 LAB — CBC WITH DIFFERENTIAL/PLATELET
Absolute Monocytes: 350 cells/uL (ref 200–950)
Basophils Absolute: 19 cells/uL (ref 0–200)
Basophils Relative: 0.4 %
Eosinophils Absolute: 91 cells/uL (ref 15–500)
Eosinophils Relative: 1.9 %
HCT: 43.1 % (ref 35.0–45.0)
Hemoglobin: 14.5 g/dL (ref 11.7–15.5)
Lymphs Abs: 1776 cells/uL (ref 850–3900)
MCH: 30.3 pg (ref 27.0–33.0)
MCHC: 33.6 g/dL (ref 32.0–36.0)
MCV: 90.2 fL (ref 80.0–100.0)
MPV: 10 fL (ref 7.5–12.5)
Monocytes Relative: 7.3 %
Neutro Abs: 2563 cells/uL (ref 1500–7800)
Neutrophils Relative %: 53.4 %
Platelets: 335 10*3/uL (ref 140–400)
RBC: 4.78 10*6/uL (ref 3.80–5.10)
RDW: 13 % (ref 11.0–15.0)
Total Lymphocyte: 37 %
WBC: 4.8 10*3/uL (ref 3.8–10.8)

## 2019-04-02 LAB — COMPLETE METABOLIC PANEL WITH GFR
AG Ratio: 1.5 (calc) (ref 1.0–2.5)
ALT: 30 U/L — ABNORMAL HIGH (ref 6–29)
AST: 19 U/L (ref 10–30)
Albumin: 4.4 g/dL (ref 3.6–5.1)
Alkaline phosphatase (APISO): 71 U/L (ref 31–125)
BUN: 11 mg/dL (ref 7–25)
CO2: 23 mmol/L (ref 20–32)
Calcium: 10 mg/dL (ref 8.6–10.2)
Chloride: 103 mmol/L (ref 98–110)
Creat: 0.75 mg/dL (ref 0.50–1.10)
GFR, Est African American: 117 mL/min/{1.73_m2} (ref >=60)
GFR, Est Non African American: 101 mL/min/{1.73_m2} (ref >=60)
Globulin: 3 g/dL (calc) (ref 1.9–3.7)
Glucose, Bld: 102 mg/dL — ABNORMAL HIGH (ref 65–99)
Potassium: 4.1 mmol/L (ref 3.5–5.3)
Sodium: 139 mmol/L (ref 135–146)
Total Bilirubin: 0.3 mg/dL (ref 0.2–1.2)
Total Protein: 7.4 g/dL (ref 6.1–8.1)

## 2019-04-04 NOTE — Progress Notes (Signed)
CBC WNL.  ALT is borderline elevated-30.  Please advise patient to avoid tylenol, NSAIDs, and alcohol.  We will continue to monitor.  CBC WNL.

## 2019-04-24 ENCOUNTER — Other Ambulatory Visit: Payer: Self-pay | Admitting: Rheumatology

## 2019-04-24 DIAGNOSIS — M0579 Rheumatoid arthritis with rheumatoid factor of multiple sites without organ or systems involvement: Secondary | ICD-10-CM

## 2019-04-25 NOTE — Telephone Encounter (Signed)
Last Visit: 04/01/2019 Next Visit: 09/05/2019 Labs: 04/01/2019 CBC WNL. ALT is borderline elevated-30. Please advise patient to avoid tylenol, NSAIDs, and alcohol. We will continue to monitor. CBC WNL. Eye exam: 02/28/2019  Okay to refill per Dr. Estanislado Pandy.

## 2019-07-21 ENCOUNTER — Other Ambulatory Visit: Payer: Self-pay | Admitting: Rheumatology

## 2019-07-21 DIAGNOSIS — M0579 Rheumatoid arthritis with rheumatoid factor of multiple sites without organ or systems involvement: Secondary | ICD-10-CM

## 2019-07-21 NOTE — Telephone Encounter (Signed)
Last Visit: 04/01/2019 Next Visit: 09/05/2019 Labs: 04/01/2019 CBC WNL. ALT is borderline elevated-30. Please advise patient to avoid tylenol, NSAIDs, and alcohol. We will continue to monitor. CBC WNL. Eye exam: 02/28/2019  Okay to refill per Dr. Deveshwar.  

## 2019-09-02 NOTE — Progress Notes (Signed)
Virtual Visit via Video Note  I connected with Marie [name] on 09/06/19 at  3:15 PM EST by a video enabled telemedicine application and verified that I am speaking with the correct person using two identifiers.  Location: Patient: Home  Provider: Clinic   This service was conducted via virtual visit.  Both audio and visual tools were used.  The patient was located at home. I was located in my office.  Consent was obtained prior to the virtual visit and is aware of possible charges through their insurance for this visit.  The patient is an established patient.  Dr. Estanislado Pandy, MD conducted the virtual visit and Hazel Sams, PA-C acted as scribe during the service.  Office staff helped with scheduling follow up visits after the service was conducted.   I discussed the limitations of evaluation and management by telemedicine and the availability of in person appointments. The patient expressed understanding and agreed to proceed.  CC: Medication monitoring  History of Present Illness: Patient is a 38 year old female with a past medical history of seropositive rheumatoid arthritis.  She is taking plaquenil 200 mg 1 tablet by mouth twice daily M-F.    She denies any recent rheumatoid arthritis flares.  She denies any joint pain or joint swelling currently. She has not had any morning stiffness. She has not had any difficulties with ADLs.  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   Review of Systems  Constitutional: Negative for fever and malaise/fatigue.  Eyes: Negative for photophobia, pain, discharge and redness.  Respiratory: Negative for cough, shortness of breath and wheezing.   Cardiovascular: Negative for chest pain and palpitations.  Gastrointestinal: Negative for blood in stool, constipation and diarrhea.  Genitourinary: Negative for dysuria.  Musculoskeletal: Negative for back pain, joint pain, myalgias and neck pain.  Skin: Negative for rash.  Neurological: Negative  for dizziness and headaches.  Psychiatric/Behavioral: Negative for depression. The patient is not nervous/anxious and does not have insomnia.       Observations/Objective: Physical Exam  Constitutional: She is oriented to person, place, and time and well-developed, well-nourished, and in no distress.  HENT:  Head: Normocephalic and atraumatic.  Eyes: Conjunctivae are normal.  Pulmonary/Chest: Effort normal.  Neurological: She is alert and oriented to person, place, and time.  Psychiatric: Mood, memory, affect and judgment normal.   Patient reports morning stiffness for 0 minutes.   Patient denies nocturnal pain.  Difficulty dressing/grooming: Denies Difficulty climbing stairs: Denies Difficulty getting out of chair: Denies Difficulty using hands for taps, buttons, cutlery, and/or writing: Denies    Assessment and Plan: Visit Diagnoses: Seropositive rheumatoid arthritis of multiple sites ([place]) - RF negative, anti-CCP positive, positive synovitis on ultrasound: She has not had any recent rheumatoid arthritis flares.  She is not having any joint pain or joint swelling currently.   No morning stiffness.   She is not having any difficulty with ADLs. She is clinically doing well on Plaquenil 200 mg 1 tablet by mouth twice daily M-F.  She has not missed any doses recently.  She will continue on the current treatment regimen.  She does not need any refills at this time.  She was advised to notify us if she develops increased joint pain or joint swelling.  She will follow up in 4-5 months.   High risk medication use -Plaquenil 200 mg 1 tablet twice daily Monday through Friday only.PLQ eye exam: 02/28/2019 WNL. Dr. Gershon Crane. Follow up 1 year. CBC and CMP were drawn  on 04/01/19.  She is due to update lab work. Standing orders are in place.  Achilles tendinitis of both lower extremities - Resolved   Other medical conditions are listed as follows:  Family history of sarcoidosis  History of  gastroesophageal reflux (GERD)   History of goiter    Follow Up Instructions: She will follow up in 4-5 months.    I discussed the assessment and treatment plan with the patient. The patient was provided an opportunity to ask questions and all were answered. The patient agreed with the plan and demonstrated an understanding of the instructions.   The patient was advised to call back or seek an in-person evaluation if the symptoms worsen or if the condition fails to improve as anticipated.  I provided 15 minutes of non-face-to-face time during this encounter.   Bo Merino, MD   Scribed by-  Hazel Sams, PA-C

## 2019-09-05 ENCOUNTER — Telehealth: Payer: BC Managed Care – PPO | Admitting: Rheumatology

## 2019-09-06 ENCOUNTER — Other Ambulatory Visit: Payer: Self-pay

## 2019-09-06 ENCOUNTER — Telehealth (INDEPENDENT_AMBULATORY_CARE_PROVIDER_SITE_OTHER): Payer: BC Managed Care – PPO | Admitting: Rheumatology

## 2019-09-06 ENCOUNTER — Encounter: Payer: Self-pay | Admitting: Rheumatology

## 2019-09-06 DIAGNOSIS — M7661 Achilles tendinitis, right leg: Secondary | ICD-10-CM | POA: Diagnosis not present

## 2019-09-06 DIAGNOSIS — Z8639 Personal history of other endocrine, nutritional and metabolic disease: Secondary | ICD-10-CM | POA: Diagnosis not present

## 2019-09-06 DIAGNOSIS — M0579 Rheumatoid arthritis with rheumatoid factor of multiple sites without organ or systems involvement: Secondary | ICD-10-CM | POA: Diagnosis not present

## 2019-09-06 DIAGNOSIS — Z832 Family history of diseases of the blood and blood-forming organs and certain disorders involving the immune mechanism: Secondary | ICD-10-CM

## 2019-09-06 DIAGNOSIS — Z8719 Personal history of other diseases of the digestive system: Secondary | ICD-10-CM

## 2019-09-06 DIAGNOSIS — Z79899 Other long term (current) drug therapy: Secondary | ICD-10-CM

## 2019-09-06 DIAGNOSIS — M7662 Achilles tendinitis, left leg: Secondary | ICD-10-CM | POA: Diagnosis not present

## 2019-12-15 ENCOUNTER — Other Ambulatory Visit: Payer: Self-pay | Admitting: Internal Medicine

## 2019-12-15 DIAGNOSIS — E042 Nontoxic multinodular goiter: Secondary | ICD-10-CM

## 2019-12-26 ENCOUNTER — Other Ambulatory Visit: Payer: BC Managed Care – PPO

## 2019-12-26 ENCOUNTER — Ambulatory Visit
Admission: RE | Admit: 2019-12-26 | Discharge: 2019-12-26 | Disposition: A | Discharge status: Home / Self Care | Payer: BC Managed Care – PPO | Source: Ambulatory Visit | Attending: Internal Medicine | Admitting: Internal Medicine

## 2019-12-26 DIAGNOSIS — E042 Nontoxic multinodular goiter: Secondary | ICD-10-CM

## 2020-01-27 NOTE — Progress Notes (Signed)
Office Visit Note  Patient: Marie [name]             Date of Birth: October 25, 1980           MRN: [id number]             PCP: Wenda Low, MD Referring: Wenda Low, MD Visit Date: 02/07/2020 Occupation: @GUAROCC @  Subjective:  Medication monitoring  History of Present Illness: Marie [name] is a 39 y.o. female with history of seropositive rheumatoid arthritis.  She is taking Plaquenil 200 mg 1 tablet by mouth twice daily Monday through Friday.  She is tolerating Plaquenil and has not missed any doses recently.  She denies any recent rheumatoid arthritis flares.  She states that she experiences some stiffness after sitting for prolonged periods of time but denies any inflammation.  She is not having any neck or lower back pain.  She denies any new concerns.    Activities of Daily Living:  Patient reports morning stiffness for 0 none.   Patient Denies nocturnal pain.  Difficulty dressing/grooming: Denies Difficulty climbing stairs: Denies Difficulty getting out of chair: Reports Difficulty using hands for taps, buttons, cutlery, and/or writing: Denies  Review of Systems  Constitutional: Negative for fatigue.  HENT: Negative for mouth sores, mouth dryness and nose dryness.   Eyes: Negative for pain, visual disturbance and dryness.  Respiratory: Negative for cough, hemoptysis, shortness of breath and difficulty breathing.   Cardiovascular: Negative for chest pain, palpitations, hypertension and swelling in legs/feet.  Gastrointestinal: Negative for blood in stool, constipation and diarrhea.  Endocrine: Negative for excessive thirst and increased urination.  Genitourinary: Negative for difficulty urinating and painful urination.  Musculoskeletal: Positive for arthralgias, joint pain and joint swelling. Negative for myalgias, muscle weakness, morning stiffness, muscle tenderness and myalgias.  Skin: Negative for color change, pallor, rash, hair loss, nodules/bumps, skin tightness,  ulcers and sensitivity to sunlight.  Allergic/Immunologic: Negative for susceptible to infections.  Neurological: Negative for dizziness, numbness, headaches and weakness.  Hematological: Negative for bruising/bleeding tendency and swollen glands.  Psychiatric/Behavioral: Negative for depressed mood and sleep disturbance. The patient is not nervous/anxious.     PMFS History:  Patient Active Problem List   Diagnosis Date Noted  . Seropositive rheumatoid arthritis of multiple sites ([place]) 11/16/2016  . History of goiter 11/16/2016  . Family history of sarcoidosis 11/16/2016  . History of gastroesophageal reflux (GERD) 11/03/2016  . Abnormal serum angiotensin-converting enzyme level,has had positive rheumatoid factor, and anti-DNAse B in the past.   11/03/2016  . High serum angiotensin converting enzyme (ACE) 11/03/2016  . Goiter 08/05/2016  . Gastroesophageal reflux disease without esophagitis 08/05/2016    Past Medical History:  Diagnosis Date  . Enlarged thyroid    no current med.  Marland Kitchen GERD (gastroesophageal reflux disease)   . Hypertension    states under control with meds., has been on med. since 03/2017  . Macromastia 06/2017  . Rheumatoid arthritis ([place])   . Sleep apnea    no CPAP use but did bring CPAP machine with her  . Vertigo     Family History  Problem Relation Age of Onset  . Hypertension Mother   . Arthritis Mother   . Sarcoidosis Mother   . Diabetes Other   . Diabetes Maternal Grandmother   . Congestive Heart Failure Maternal Grandmother   . Cancer Maternal Grandfather    Past Surgical History:  Procedure Laterality Date  . BREAST REDUCTION SURGERY Bilateral 07/06/2017   Procedure: MAMMARY REDUCTION  (BREAST);  Surgeon: Cristine Polio, MD;  Location: [facility];  Service: Plastics;  Laterality: Bilateral;  . CHROMOPERTUBATION  05/30/2011  . HYSTEROSCOPY DIAGNOSTIC  05/30/2011  . LAPAROSCOPIC LYSIS OF ADHESIONS  05/30/2011  . LAPAROSCOPIC  UNILATERAL SALPINGECTOMY Right 09/19/2010   Social History   Social History Narrative  . Not on file    There is no immunization history on file for this patient.   Objective: Vital Signs: BP 118/73 (BP Location: Left Arm, Patient Position: Sitting, Cuff Size: Normal)   Pulse 79   Resp 16   Ht 5\' 2"  (1.575 m)   Wt 235 lb 3.2 oz (106.7 kg)   BMI 43.02 kg/m    Physical Exam Vitals and nursing note reviewed.  Constitutional:      Appearance: She is well-developed.  HENT:     Head: Normocephalic and atraumatic.  Eyes:     Conjunctiva/sclera: Conjunctivae normal.  Cardiovascular:     Rate and Rhythm: Normal rate and regular rhythm.     Heart sounds: Normal heart sounds.  Pulmonary:     Effort: Pulmonary effort is normal.     Breath sounds: Normal breath sounds.  Abdominal:     General: Bowel sounds are normal.     Palpations: Abdomen is soft.  Musculoskeletal:     Cervical back: Normal range of motion.  Lymphadenopathy:     Cervical: No cervical adenopathy.  Skin:    General: Skin is warm and dry.     Capillary Refill: Capillary refill takes less than 2 seconds.  Neurological:     Mental Status: She is alert and oriented to person, place, and time.  Psychiatric:        Behavior: Behavior normal.      Musculoskeletal Exam: C-spine, thoracic spine, lumbar spine good range of motion.  No midline spinal tenderness.  No SI joint tenderness.  Shoulder joints, elbow joints, wrist joints, MCPs, PIPs, DIPs good range of motion with no synovitis.  She has complete fist formation bilaterally.  Hip joints, knee joints, ankle joints, MTPs, PIPs, DIPs good range of motion with no synovitis.  No warmth or effusion of knee joints noted.  No tenderness or inflammation of ankle joints.  CDAI Exam: CDAI Score: 0.4  Patient Global: 3 mm; Provider Global: 1 mm Swollen: 0 ; Tender: 0  Joint Exam 02/07/2020   No joint exam has been documented for this visit   There is currently no  information documented on the homunculus. Go to the Rheumatology activity and complete the homunculus joint exam.  Investigation: No additional findings.  Imaging: No results found.  Recent Labs: Lab Results  Component Value Date   WBC 4.8 04/01/2019   HGB 14.5 04/01/2019   PLT 335 04/01/2019   NA 139 04/01/2019   K 4.1 04/01/2019   CL 103 04/01/2019   CO2 23 04/01/2019   GLUCOSE 102 (H) 04/01/2019   BUN 11 04/01/2019   CREATININE 0.75 04/01/2019   BILITOT 0.3 04/01/2019   ALKPHOS 72 04/16/2017   AST 19 04/01/2019   ALT 30 (H) 04/01/2019   PROT 7.4 04/01/2019   ALBUMIN 4.4 04/16/2017   CALCIUM 10.0 04/01/2019   GFRAA 117 04/01/2019    Speciality Comments: PLQ eye exam: 02/28/2019 WNL. Dr. Gershon Crane. Follow up 1 year.  Procedures:  No procedures performed Allergies: Iodine, Red dye, Shellfish allergy, Latex, and Sulfa antibiotics   Assessment / Plan:     Visit Diagnoses: Seropositive rheumatoid arthritis of multiple sites ([place]) -  RF  negative, anti-CCP positive, positive synovitis on ultrasound: She has no synovitis or tenderness on exam.  She has not had any recent rheumatoid arthritis flares.  She is clinically doing well on Plaquenil 200 mg 1 tablet by mouth twice daily Monday through Friday.  She is tolerating Plaquenil and has not missed any doses recently.  She is not experiencing any joint pain or inflammation at this time.  She occasionally will experience joint stiffness after sitting for prolonged periods of time but has not noticed any inflammation.  She will continue taking Plaquenil as prescribed.  Prescription refill pending lab work today.  She was advised to notify us if she develops increased joint pain or joint swelling.  She will follow-up in the office in 5 months.  High risk medication use - Plaquenil 200 mg 1 tablet twice daily Monday through Friday only.PLQ eye exam: 02/28/2019 WNL. Dr. Gershon Crane.  CBC and CMP were drawn today to monitor for drug toxicity.  She  will return for lab work every 5 months.- Plan: CBC with Differential/Platelet, COMPLETE METABOLIC PANEL WITH GFR  Achilles tendinitis of both lower extremities - Resolved   Other medical conditions are listed as follows:  Family history of sarcoidosis  History of goiter  History of gastroesophageal reflux (GERD)  Orders: Orders Placed This Encounter  Procedures  . CBC with Differential/Platelet  . COMPLETE METABOLIC PANEL WITH GFR   No orders of the defined types were placed in this encounter.   Follow-Up Instructions: Return in about 5 months (around 07/09/2020) for Rheumatoid arthritis.   Ofilia Neas, PA-C  Note - This record has been created using Dragon software.  Chart creation errors have been sought, but may not always  have been located. Such creation errors do not reflect on  the standard of medical care.

## 2020-02-07 ENCOUNTER — Telehealth: Payer: Self-pay

## 2020-02-07 ENCOUNTER — Ambulatory Visit: Payer: BC Managed Care – PPO | Admitting: Physician Assistant

## 2020-02-07 ENCOUNTER — Other Ambulatory Visit: Payer: Self-pay

## 2020-02-07 ENCOUNTER — Encounter: Payer: Self-pay | Admitting: Rheumatology

## 2020-02-07 VITALS — BP 118/73 | HR 79 | Resp 16 | Ht 62.0 in | Wt 235.2 lb

## 2020-02-07 DIAGNOSIS — Z832 Family history of diseases of the blood and blood-forming organs and certain disorders involving the immune mechanism: Secondary | ICD-10-CM | POA: Diagnosis not present

## 2020-02-07 DIAGNOSIS — Z8639 Personal history of other endocrine, nutritional and metabolic disease: Secondary | ICD-10-CM

## 2020-02-07 DIAGNOSIS — M0579 Rheumatoid arthritis with rheumatoid factor of multiple sites without organ or systems involvement: Secondary | ICD-10-CM | POA: Diagnosis not present

## 2020-02-07 DIAGNOSIS — M7661 Achilles tendinitis, right leg: Secondary | ICD-10-CM | POA: Diagnosis not present

## 2020-02-07 DIAGNOSIS — M7662 Achilles tendinitis, left leg: Secondary | ICD-10-CM

## 2020-02-07 DIAGNOSIS — Z79899 Other long term (current) drug therapy: Secondary | ICD-10-CM | POA: Diagnosis not present

## 2020-02-07 DIAGNOSIS — Z8719 Personal history of other diseases of the digestive system: Secondary | ICD-10-CM

## 2020-02-07 LAB — CBC WITH DIFFERENTIAL/PLATELET
Absolute Monocytes: 504 cells/uL (ref 200–950)
Basophils Absolute: 42 cells/uL (ref 0–200)
Basophils Relative: 0.8 %
Eosinophils Absolute: 99 cells/uL (ref 15–500)
Eosinophils Relative: 1.9 %
HCT: 39.3 % (ref 35.0–45.0)
Hemoglobin: 13.3 g/dL (ref 11.7–15.5)
Lymphs Abs: 2241 cells/uL (ref 850–3900)
MCH: 30.6 pg (ref 27.0–33.0)
MCHC: 33.8 g/dL (ref 32.0–36.0)
MCV: 90.3 fL (ref 80.0–100.0)
MPV: 10.2 fL (ref 7.5–12.5)
Monocytes Relative: 9.7 %
Neutro Abs: 2314 cells/uL (ref 1500–7800)
Neutrophils Relative %: 44.5 %
Platelets: 327 10*3/uL (ref 140–400)
RBC: 4.35 10*6/uL (ref 3.80–5.10)
RDW: 12.7 % (ref 11.0–15.0)
Total Lymphocyte: 43.1 %
WBC: 5.2 10*3/uL (ref 3.8–10.8)

## 2020-02-07 LAB — COMPLETE METABOLIC PANEL WITH GFR
AG Ratio: 1.5 (calc) (ref 1.0–2.5)
ALT: 26 U/L (ref 6–29)
AST: 19 U/L (ref 10–30)
Albumin: 4.3 g/dL (ref 3.6–5.1)
Alkaline phosphatase (APISO): 57 U/L (ref 31–125)
BUN: 10 mg/dL (ref 7–25)
CO2: 29 mmol/L (ref 20–32)
Calcium: 9.8 mg/dL (ref 8.6–10.2)
Chloride: 101 mmol/L (ref 98–110)
Creat: 1 mg/dL (ref 0.50–1.10)
GFR, Est African American: 82 mL/min/{1.73_m2} (ref >=60)
GFR, Est Non African American: 71 mL/min/{1.73_m2} (ref >=60)
Globulin: 2.8 g/dL (calc) (ref 1.9–3.7)
Glucose, Bld: 86 mg/dL (ref 65–99)
Potassium: 3.9 mmol/L (ref 3.5–5.3)
Sodium: 140 mmol/L (ref 135–146)
Total Bilirubin: 0.3 mg/dL (ref 0.2–1.2)
Total Protein: 7.1 g/dL (ref 6.1–8.1)

## 2020-02-07 NOTE — Telephone Encounter (Signed)
Please refill plaquenil pending lab results, per Hazel Sams, PA-C. Thanks!

## 2020-02-08 MED ORDER — HYDROXYCHLOROQUINE SULFATE 200 MG PO TABS: ORAL_TABLET | ORAL | 0 refills | Status: DC

## 2020-02-08 NOTE — Telephone Encounter (Signed)
Last Visit: 02/07/2020 Next Visit: 07/10/2020 Labs: 02/07/2020 CBC and CMP WNL.  Eye exam: 02/28/2019   Per office note on 02/07/2020: Plaquenil 200 mg 1 tablet twice daily Monday through Friday only  Okay to refill per Dr. Estanislado Pandy.

## 2020-02-08 NOTE — Telephone Encounter (Signed)
Patient is aware that prescription has been refilled.

## 2020-02-08 NOTE — Progress Notes (Signed)
CBC and CMP WNL

## 2020-04-30 ENCOUNTER — Other Ambulatory Visit: Payer: Self-pay | Admitting: Rheumatology

## 2020-04-30 DIAGNOSIS — M0579 Rheumatoid arthritis with rheumatoid factor of multiple sites without organ or systems involvement: Secondary | ICD-10-CM

## 2020-04-30 NOTE — Telephone Encounter (Signed)
Last Visit: 02/07/2020 Next Visit: 07/10/2020 Labs: 02/07/2020 CBC and CMP WNL.  Eye exam: 03/01/2020 WNL   Current Dose per office note 02/07/2020: - Plaquenil 200 mg 1 tablet twice daily Monday through Friday only [id number] rheumatoid arthritis of multiple sites   Okay to refill per Dr. Estanislado Pandy

## 2020-06-26 NOTE — Progress Notes (Deleted)
Office Visit Note  Patient: Marie [name]             Date of Birth: 12-Nov-1980           MRN: [id number]             PCP: Wenda Low, MD Referring: Wenda Low, MD Visit Date: 07/10/2020 Occupation: @GUAROCC @  Subjective:  No chief complaint on file.   History of Present Illness: Marie [name] is a 39 y.o. female ***   Activities of Daily Living:  Patient reports morning stiffness for *** {minute/hour:19697}.   Patient {ACTIONS;DENIES/REPORTS:21021675::"Denies"} nocturnal pain.  Difficulty dressing/grooming: {ACTIONS;DENIES/REPORTS:21021675::"Denies"} Difficulty climbing stairs: {ACTIONS;DENIES/REPORTS:21021675::"Denies"} Difficulty getting out of chair: {ACTIONS;DENIES/REPORTS:21021675::"Denies"} Difficulty using hands for taps, buttons, cutlery, and/or writing: {ACTIONS;DENIES/REPORTS:21021675::"Denies"}  No Rheumatology ROS completed.   PMFS History:  Patient Active Problem List   Diagnosis Date Noted  . Seropositive rheumatoid arthritis of multiple sites ([place]) 11/16/2016  . History of goiter 11/16/2016  . Family history of sarcoidosis 11/16/2016  . History of gastroesophageal reflux (GERD) 11/03/2016  . Abnormal serum angiotensin-converting enzyme level,has had positive rheumatoid factor, and anti-DNAse B in the past.   11/03/2016  . High serum angiotensin converting enzyme (ACE) 11/03/2016  . Goiter 08/05/2016  . Gastroesophageal reflux disease without esophagitis 08/05/2016    Past Medical History:  Diagnosis Date  . Enlarged thyroid    no current med.  Marland Kitchen GERD (gastroesophageal reflux disease)   . Hypertension    states under control with meds., has been on med. since 03/2017  . Macromastia 06/2017  . Rheumatoid arthritis ([place])   . Sleep apnea    no CPAP use but did bring CPAP machine with her  . Vertigo     Family History  Problem Relation Age of Onset  . Hypertension Mother   . Arthritis Mother   . Sarcoidosis Mother   . Diabetes Other   .  Diabetes Maternal Grandmother   . Congestive Heart Failure Maternal Grandmother   . Cancer Maternal Grandfather    Past Surgical History:  Procedure Laterality Date  . BREAST REDUCTION SURGERY Bilateral 07/06/2017   Procedure: MAMMARY REDUCTION  (BREAST);  Surgeon: Cristine Polio, MD;  Location: [facility];  Service: Plastics;  Laterality: Bilateral;  . CHROMOPERTUBATION  05/30/2011  . HYSTEROSCOPY DIAGNOSTIC  05/30/2011  . LAPAROSCOPIC LYSIS OF ADHESIONS  05/30/2011  . LAPAROSCOPIC UNILATERAL SALPINGECTOMY Right 09/19/2010   Social History   Social History Narrative  . Not on file    There is no immunization history on file for this patient.   Objective: Vital Signs: There were no vitals taken for this visit.   Physical Exam   Musculoskeletal Exam: ***  CDAI Exam: CDAI Score: -- Patient Global: --; Provider Global: -- Swollen: --; Tender: -- Joint Exam 07/10/2020   No joint exam has been documented for this visit   There is currently no information documented on the homunculus. Go to the Rheumatology activity and complete the homunculus joint exam.  Investigation: No additional findings.  Imaging: No results found.  Recent Labs: Lab Results  Component Value Date   WBC 5.2 02/07/2020   HGB 13.3 02/07/2020   PLT 327 02/07/2020   NA 140 02/07/2020   K 3.9 02/07/2020   CL 101 02/07/2020   CO2 29 02/07/2020   GLUCOSE 86 02/07/2020   BUN 10 02/07/2020   CREATININE 1.00 02/07/2020   BILITOT 0.3 02/07/2020   ALKPHOS 72 04/16/2017   AST 19 02/07/2020   ALT 26 02/07/2020  PROT 7.1 02/07/2020   ALBUMIN 4.4 04/16/2017   CALCIUM 9.8 02/07/2020   GFRAA 82 02/07/2020    Speciality Comments: PLQ eye exam: 03/01/2020 WNL Dr. Gershon Crane. Follow up 1 year.  Procedures:  No procedures performed Allergies: Iodine, Red dye, Shellfish allergy, Latex, and Sulfa antibiotics   Assessment / Plan:     Visit Diagnoses: No diagnosis found.  Orders: No  orders of the defined types were placed in this encounter.  No orders of the defined types were placed in this encounter.   Face-to-face time spent with patient was *** minutes. Greater than 50% of time was spent in counseling and coordination of care.  Follow-Up Instructions: No follow-ups on file.   Earnestine Mealing, CMA  Note - This record has been created using Editor, commissioning.  Chart creation errors have been sought, but may not always  have been located. Such creation errors do not reflect on  the standard of medical care.

## 2020-07-10 ENCOUNTER — Ambulatory Visit: Payer: BC Managed Care – PPO | Admitting: Physician Assistant

## 2020-07-10 NOTE — Progress Notes (Signed)
Office Visit Note  Patient: Marie [name]             Date of Birth: 09-20-1981           MRN: [id number]             PCP: Wenda Low, MD Referring: Wenda Low, MD Visit Date: 07/24/2020 Occupation: @GUAROCC @  Subjective:  Medication monitoring   History of Present Illness: Marie [name] is a 39 y.o. female with history of seropositive rheumatoid arthritis.  She is taking plaquenil 200 mg 1 tablet by mouth twice daily M-F.  She ran out of her prescription for Plaquenil 1.5 weeks ago.  Patient reports that she has been experiencing increased pain and stiffness in both hands, both wrist joints, and both feet intermittently.  She has ongoing tenderness in both elbows.  She has been moving for the past weeks which she feels is exacerbated some of her discomfort.  She states that for the past 3 weeks she has been experiencing muscle spasms in the left trapezius muscle next to her shoulder blade.  She states that she tried getting massage a week ago and has had 1 recurrence of symptoms since then. She denies any recent infections.  She has received a COVID-19 vaccinations and is planning on receiving the booster.  Activities of Daily Living:  Patient reports morning stiffness for 0 minutes.   Patient Denies nocturnal pain.  Difficulty dressing/grooming: Denies Difficulty climbing stairs: Denies Difficulty getting out of chair: Reports Difficulty using hands for taps, buttons, cutlery, and/or writing: Denies  Review of Systems  Constitutional: Negative for fatigue.  HENT: Negative for mouth sores, mouth dryness and nose dryness.   Eyes: Positive for dryness. Negative for pain and visual disturbance.  Respiratory: Negative for cough, hemoptysis, shortness of breath and difficulty breathing.   Cardiovascular: Negative for chest pain, palpitations, hypertension and swelling in legs/feet.  Gastrointestinal: Negative for blood in stool, constipation and diarrhea.  Endocrine: Negative  for increased urination.  Genitourinary: Negative for painful urination.  Musculoskeletal: Positive for arthralgias, joint pain and muscle tenderness. Negative for joint swelling, myalgias, muscle weakness, morning stiffness and myalgias.  Skin: Negative for color change, pallor, rash, hair loss, nodules/bumps, skin tightness, ulcers and sensitivity to sunlight.  Allergic/Immunologic: Negative for susceptible to infections.  Neurological: Negative for dizziness, numbness, headaches and weakness.  Hematological: Negative for swollen glands.  Psychiatric/Behavioral: Negative for depressed mood and sleep disturbance. The patient is not nervous/anxious.     PMFS History:  Patient Active Problem List   Diagnosis Date Noted  . Seropositive rheumatoid arthritis of multiple sites ([place]) 11/16/2016  . History of goiter 11/16/2016  . Family history of sarcoidosis 11/16/2016  . History of gastroesophageal reflux (GERD) 11/03/2016  . Abnormal serum angiotensin-converting enzyme level,has had positive rheumatoid factor, and anti-DNAse B in the past.   11/03/2016  . High serum angiotensin converting enzyme (ACE) 11/03/2016  . Goiter 08/05/2016  . Gastroesophageal reflux disease without esophagitis 08/05/2016    Past Medical History:  Diagnosis Date  . Enlarged thyroid    no current med.  Marland Kitchen GERD (gastroesophageal reflux disease)   . Hypertension    states under control with meds., has been on med. since 03/2017  . Macromastia 06/2017  . Rheumatoid arthritis ([place])   . Sleep apnea    no CPAP use but did bring CPAP machine with her  . Vertigo     Family History  Problem Relation Age of Onset  . Hypertension Mother   .  Arthritis Mother   . Sarcoidosis Mother   . Diabetes Other   . Diabetes Maternal Grandmother   . Congestive Heart Failure Maternal Grandmother   . Cancer Maternal Grandfather    Past Surgical History:  Procedure Laterality Date  . BREAST REDUCTION SURGERY Bilateral 07/06/2017    Procedure: MAMMARY REDUCTION  (BREAST);  Surgeon: Cristine Polio, MD;  Location: [facility];  Service: Plastics;  Laterality: Bilateral;  . CHROMOPERTUBATION  05/30/2011  . HYSTEROSCOPY DIAGNOSTIC  05/30/2011  . LAPAROSCOPIC LYSIS OF ADHESIONS  05/30/2011  . LAPAROSCOPIC UNILATERAL SALPINGECTOMY Right 09/19/2010   Social History   Social History Narrative  . Not on file   Immunization History  Administered Date(s) Administered  . PFIZER SARS-COV-2 Vaccination 11/19/2019, 12/10/2019     Objective: Vital Signs: BP 124/80 (BP Location: Left Arm, Patient Position: Sitting, Cuff Size: Small)   Pulse 73   Ht 5\' 2"  (1.575 m)   Wt 235 lb (106.6 kg)   BMI 42.98 kg/m    Physical Exam Vitals and nursing note reviewed.  Constitutional:      Appearance: She is well-developed.  HENT:     Head: Normocephalic and atraumatic.  Eyes:     Conjunctiva/sclera: Conjunctivae normal.  Pulmonary:     Effort: Pulmonary effort is normal.  Abdominal:     Palpations: Abdomen is soft.  Musculoskeletal:     Cervical back: Normal range of motion.  Skin:    General: Skin is warm and dry.     Capillary Refill: Capillary refill takes less than 2 seconds.  Neurological:     Mental Status: She is alert and oriented to person, place, and time.  Psychiatric:        Behavior: Behavior normal.      Musculoskeletal Exam: C-spine, thoracic spine, lumbar spine have good range of motion.  No midline spinal tenderness.  She has tenderness over the left trapezius muscle.  Shoulder joints, elbow joints, wrist joints, MCPs, PIPs, DIPs have good range of motion with no synovitis.  Tenderness over both elbow joint lines. she has complete fist formation bilaterally.  Tenderness of the right fourth and fifth MCP joints and right third PIP joint.  Hip joints have good range of motion with no discomfort.  Knee joints have good range of motion no tenderness or inflammation.  Ankle joints have good range  of motion no tenderness inflammation.  No tenderness of MTP joints.  CDAI Exam: CDAI Score: 5.6  Patient Global: 3 mm; Provider Global: 3 mm Swollen: 0 ; Tender: 5  Joint Exam 07/24/2020      Right  Left  Elbow   Tender   Tender  MCP 4   Tender     MCP 5   Tender     PIP 3   Tender        Investigation: No additional findings.  Imaging: No results found.  Recent Labs: Lab Results  Component Value Date   WBC 5.2 02/07/2020   HGB 13.3 02/07/2020   PLT 327 02/07/2020   NA 140 02/07/2020   K 3.9 02/07/2020   CL 101 02/07/2020   CO2 29 02/07/2020   GLUCOSE 86 02/07/2020   BUN 10 02/07/2020   CREATININE 1.00 02/07/2020   BILITOT 0.3 02/07/2020   ALKPHOS 72 04/16/2017   AST 19 02/07/2020   ALT 26 02/07/2020   PROT 7.1 02/07/2020   ALBUMIN 4.4 04/16/2017   CALCIUM 9.8 02/07/2020   GFRAA 82 02/07/2020    Speciality Comments:  PLQ eye exam: 03/01/2020 WNL Dr. Gershon Crane. Follow up 1 year.  Procedures:  No procedures performed Allergies: Iodine, Red dye, Shellfish allergy, Latex, and Sulfa antibiotics   Assessment / Plan:     Visit Diagnoses: Seropositive rheumatoid arthritis of multiple sites ([place]) - RF negative, anti-CCP positive, positive synovitis on ultrasound: She has no synovitis on exam.  She has not had any recent rheumatoid arthritis flares.  She has been experiencing intermittent discomfort in both elbow joints, both wrist joints, both hands, and both feet but has not noticed any joint inflammation.  She has been moving over the past 1 week which she feels has exacerbated her discomfort as well as recent weather changes.  She has tenderness palpation over both elbow joint and the right fourth and fifth MCPs and right third PIP joint no synovitis was noted.  Overall she has clinically been doing well on Plaquenil 200 mg 1 tablet by mouth twice daily Monday through Friday.  She ran out of her prescription 1.5 weeks ago so a new prescription for Plaquenil was sent to the  pharmacy today.  We will check a sed rate along with her routine CBC and CMP.  She will continue taking Plaquenil as prescribed.  She was advised to notify us if she develops increased joint pain or joint swelling.  She will follow-up in the office in 5 months. - Plan: Sedimentation rate, hydroxychloroquine (PLAQUENIL) 200 MG tablet  High risk medication use - Plaquenil 200 mg 1 tablet twice daily Monday through Friday only. CBC and CMP were drawn on 02/07/20.  She is overdue to update lab work.  Orders for CBC and CMP were released today. PLQ eye exam: 03/01/2020 WNL Dr. Gershon Crane. Follow up 1 year.  - Plan: COMPLETE METABOLIC PANEL WITH GFR, CBC with Differential/Platelet She has not had any recent infections.  She has received both COVID-19 vaccinations and plans on receiving the booster dose.  She was advised to avoid taking Tylenol and NSAIDs 24 hours prior to the booster dose.  She was advised to notify us or PCP if she develops a COVID-19 infection in order to receive the monoclonal antibody infusion.  She voiced understanding.   Trapezius muscle spasm: She has been experiencing intermittent muscle spasms in the left trapezius muscle for the past 3 weeks.  She has been unable to identify a trigger.  She is not experiencing any chest pain, shortness of breath, cough, or pleuritic chest pain. No rash noted.  She had a massage 1 week ago and has only had 1 episode of the fleeting pain since then. She has no midline spinal tenderness on exam today. Her discomfort seems muscular in origin. We discussed the use of topical agents such as salonpas or biofreeze which she can apply for pain relief.  We also discussed the importance of heat and massage.  She was advised to notify us if her symptoms persist or worsen.  Other medical conditions are listed as follows:   Achilles tendinitis of both lower extremities - Resolved   Family history of sarcoidosis  History of gastroesophageal reflux (GERD)  History of  goiter    Orders: Orders Placed This Encounter  Procedures  . COMPLETE METABOLIC PANEL WITH GFR  . CBC with Differential/Platelet  . Sedimentation rate   Meds ordered this encounter  Medications  . hydroxychloroquine (PLAQUENIL) 200 MG tablet    Sig: Take 1 tablet by mouth twice daily Monday through Friday only.    Dispense:  120 tablet  Refill:  0    Follow-Up Instructions: Return in about 5 months (around 12/22/2020) for Rheumatoid arthritis.   Ofilia Neas, PA-C  Note - This record has been created using Dragon software.  Chart creation errors have been sought, but may not always  have been located. Such creation errors do not reflect on  the standard of medical care.

## 2020-07-24 ENCOUNTER — Encounter: Payer: Self-pay | Admitting: Physician Assistant

## 2020-07-24 ENCOUNTER — Ambulatory Visit: Payer: BC Managed Care – PPO | Admitting: Physician Assistant

## 2020-07-24 ENCOUNTER — Other Ambulatory Visit: Payer: Self-pay

## 2020-07-24 VITALS — BP 124/80 | HR 73 | Ht 62.0 in | Wt 235.0 lb

## 2020-07-24 DIAGNOSIS — Z8719 Personal history of other diseases of the digestive system: Secondary | ICD-10-CM

## 2020-07-24 DIAGNOSIS — Z832 Family history of diseases of the blood and blood-forming organs and certain disorders involving the immune mechanism: Secondary | ICD-10-CM

## 2020-07-24 DIAGNOSIS — Z79899 Other long term (current) drug therapy: Secondary | ICD-10-CM | POA: Diagnosis not present

## 2020-07-24 DIAGNOSIS — M0579 Rheumatoid arthritis with rheumatoid factor of multiple sites without organ or systems involvement: Secondary | ICD-10-CM

## 2020-07-24 DIAGNOSIS — M7661 Achilles tendinitis, right leg: Secondary | ICD-10-CM | POA: Diagnosis not present

## 2020-07-24 DIAGNOSIS — M62838 Other muscle spasm: Secondary | ICD-10-CM | POA: Diagnosis not present

## 2020-07-24 DIAGNOSIS — Z8639 Personal history of other endocrine, nutritional and metabolic disease: Secondary | ICD-10-CM

## 2020-07-24 DIAGNOSIS — M7662 Achilles tendinitis, left leg: Secondary | ICD-10-CM

## 2020-07-24 MED ORDER — HYDROXYCHLOROQUINE SULFATE 200 MG PO TABS: ORAL_TABLET | ORAL | 0 refills | Status: DC

## 2020-07-24 NOTE — Patient Instructions (Signed)
  COVID-19 vaccine recommendations:   COVID-19 vaccine is recommended for everyone (unless you are allergic to a vaccine component), even if you are on a medication that suppresses your immune system.   If you are on Methotrexate, Cellcept (mycophenolate), Rinvoq, Xeljanz, and Olumiant- hold the medication for 1 week after each vaccine. Hold Methotrexate for 2 weeks after the single dose COVID-19 vaccine.   If you are on Orencia subcutaneous injection - hold medication one week prior to and one week after the first COVID-19 vaccine dose (only).   If you are on Orencia IV infusions- time vaccination administration so that the first COVID-19 vaccination will occur four weeks after the infusion and postpone the subsequent infusion by one week.   If you are on Cyclophosphamide or Rituxan infusions please contact your doctor prior to receiving the COVID-19 vaccine.   Do not take Tylenol or any anti-inflammatory medications (NSAIDs) 24 hours prior to the COVID-19 vaccination.   There is no direct evidence about the efficacy of the COVID-19 vaccine in individuals who are on medications that suppress the immune system.   Even if you are fully vaccinated, and you are on any medications that suppress your immune system, please continue to wear a mask, maintain at least six feet social distance and practice hand hygiene.   If you develop a COVID-19 infection, please contact your PCP or our office to determine if you need monoclonal antibody infusion.  The booster vaccine is now available for immunocompromised patients.   Please see the following web sites for updated information.   [email or web address]    

## 2020-07-25 LAB — COMPLETE METABOLIC PANEL WITH GFR
AG Ratio: 1.7 (calc) (ref 1.0–2.5)
ALT: 19 U/L (ref 6–29)
AST: 13 U/L (ref 10–30)
Albumin: 4.5 g/dL (ref 3.6–5.1)
Alkaline phosphatase (APISO): 57 U/L (ref 31–125)
BUN: 8 mg/dL (ref 7–25)
CO2: 32 mmol/L (ref 20–32)
Calcium: 9.8 mg/dL (ref 8.6–10.2)
Chloride: 102 mmol/L (ref 98–110)
Creat: 0.74 mg/dL (ref 0.50–1.10)
GFR, Est African American: 118 mL/min/{1.73_m2} (ref >=60)
GFR, Est Non African American: 102 mL/min/{1.73_m2} (ref >=60)
Globulin: 2.7 g/dL (calc) (ref 1.9–3.7)
Glucose, Bld: 111 mg/dL — ABNORMAL HIGH (ref 65–99)
Potassium: 3.8 mmol/L (ref 3.5–5.3)
Sodium: 140 mmol/L (ref 135–146)
Total Bilirubin: 0.2 mg/dL (ref 0.2–1.2)
Total Protein: 7.2 g/dL (ref 6.1–8.1)

## 2020-07-25 LAB — CBC WITH DIFFERENTIAL/PLATELET
Absolute Monocytes: 522 cells/uL (ref 200–950)
Basophils Absolute: 29 cells/uL (ref 0–200)
Basophils Relative: 0.5 %
Eosinophils Absolute: 110 cells/uL (ref 15–500)
Eosinophils Relative: 1.9 %
HCT: 39.1 % (ref 35.0–45.0)
Hemoglobin: 13.4 g/dL (ref 11.7–15.5)
Lymphs Abs: 2413 cells/uL (ref 850–3900)
MCH: 30.7 pg (ref 27.0–33.0)
MCHC: 34.3 g/dL (ref 32.0–36.0)
MCV: 89.7 fL (ref 80.0–100.0)
MPV: 10.2 fL (ref 7.5–12.5)
Monocytes Relative: 9 %
Neutro Abs: 2726 cells/uL (ref 1500–7800)
Neutrophils Relative %: 47 %
Platelets: 359 10*3/uL (ref 140–400)
RBC: 4.36 10*6/uL (ref 3.80–5.10)
RDW: 12.5 % (ref 11.0–15.0)
Total Lymphocyte: 41.6 %
WBC: 5.8 10*3/uL (ref 3.8–10.8)

## 2020-07-25 LAB — SEDIMENTATION RATE: Sed Rate: 19 mm/h (ref 0–20)

## 2020-07-25 NOTE — Progress Notes (Signed)
Glucose is 111.  Rest of CMP WNL.  CBC WNL.  ESR WNL.

## 2020-12-13 NOTE — Progress Notes (Signed)
Office Visit Note  Patient: Marie [name]             Date of Birth: 1981-03-10           MRN: [id number]             PCP: Wenda Low, MD Referring: Wenda Low, MD Visit Date: 12/26/2020 Occupation: @GUAROCC @  Subjective:  Pain in both elbows.   History of Present Illness: Marie [name] is a 40 y.o. female with history of seropositive rheumatoid arthritis.  She states she has been doing well on Plaquenil.  She denies any joint pain or joint swelling.  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  She has been experiencing some discomfort in her elbows which she describes over the lateral epicondyle region.  She states the pain was more prominent about a week ago and it has eased off to some extent.  Activities of Daily Living:  Patient reports morning stiffness for 0 minutes.   Patient Denies nocturnal pain.  Difficulty dressing/grooming: Denies Difficulty climbing stairs: Denies Difficulty getting out of chair: Denies Difficulty using hands for taps, buttons, cutlery, and/or writing: Denies  Review of Systems  Constitutional: Negative for fatigue.  HENT: Negative for mouth sores, mouth dryness and nose dryness.   Eyes: Negative for pain, itching and dryness.  Respiratory: Negative for shortness of breath and difficulty breathing.   Cardiovascular: Negative for chest pain and palpitations.  Gastrointestinal: Negative for blood in stool, constipation and diarrhea.  Endocrine: Negative for increased urination.  Genitourinary: Negative for difficulty urinating.  Musculoskeletal: Positive for arthralgias and joint pain. Negative for joint swelling, myalgias, morning stiffness, muscle tenderness and myalgias.  Skin: Negative for color change, rash and redness.  Allergic/Immunologic: Negative for susceptible to infections.  Neurological: Negative for dizziness, numbness, headaches, memory loss and weakness.  Hematological: Positive for bruising/bleeding  tendency.  Psychiatric/Behavioral: Negative for confusion.    PMFS History:  Patient Active Problem List   Diagnosis Date Noted  . Seropositive rheumatoid arthritis of multiple sites ([place]) 11/16/2016  . History of goiter 11/16/2016  . Family history of sarcoidosis 11/16/2016  . History of gastroesophageal reflux (GERD) 11/03/2016  . Abnormal serum angiotensin-converting enzyme level,has had positive rheumatoid factor, and anti-DNAse B in the past.   11/03/2016  . High serum angiotensin converting enzyme (ACE) 11/03/2016  . Goiter 08/05/2016  . Gastroesophageal reflux disease without esophagitis 08/05/2016    Past Medical History:  Diagnosis Date  . Enlarged thyroid    no current med.  Marland Kitchen GERD (gastroesophageal reflux disease)   . Hypertension    states under control with meds., has been on med. since 03/2017  . Macromastia 06/2017  . Rheumatoid arthritis ([place])   . Sleep apnea    no CPAP use but did bring CPAP machine with her  . Vertigo     Family History  Problem Relation Age of Onset  . Hypertension Mother   . Arthritis Mother   . Sarcoidosis Mother   . Diabetes Other   . Diabetes Maternal Grandmother   . Congestive Heart Failure Maternal Grandmother   . Cancer Maternal Grandfather    Past Surgical History:  Procedure Laterality Date  . BREAST REDUCTION SURGERY Bilateral 07/06/2017   Procedure: MAMMARY REDUCTION  (BREAST);  Surgeon: Cristine Polio, MD;  Location: [facility];  Service: Plastics;  Laterality: Bilateral;  . CHROMOPERTUBATION  05/30/2011  . HYSTEROSCOPY DIAGNOSTIC  05/30/2011  . LAPAROSCOPIC LYSIS OF ADHESIONS  05/30/2011  .  LAPAROSCOPIC UNILATERAL SALPINGECTOMY Right 09/19/2010   Social History   Social History Narrative  . Not on file   Immunization History  Administered Date(s) Administered  . PFIZER(Purple Top)SARS-COV-2 Vaccination 11/19/2019, 12/10/2019, 08/10/2020     Objective: Vital Signs: BP 137/87 (BP Location: Left  Arm, Patient Position: Sitting, Cuff Size: Large)   Pulse 83   Resp 17   Ht 5\' 2"  (1.575 m)   Wt 238 lb (108 kg)   BMI 43.53 kg/m    Physical Exam Vitals and nursing note reviewed.  Constitutional:      Appearance: She is well-developed.  HENT:     Head: Normocephalic and atraumatic.  Eyes:     Conjunctiva/sclera: Conjunctivae normal.  Cardiovascular:     Rate and Rhythm: Normal rate and regular rhythm.     Heart sounds: Normal heart sounds.  Pulmonary:     Effort: Pulmonary effort is normal.     Breath sounds: Normal breath sounds.  Abdominal:     General: Bowel sounds are normal.     Palpations: Abdomen is soft.  Musculoskeletal:     Cervical back: Normal range of motion.  Lymphadenopathy:     Cervical: No cervical adenopathy.  Skin:    General: Skin is warm and dry.     Capillary Refill: Capillary refill takes less than 2 seconds.  Neurological:     Mental Status: She is alert and oriented to person, place, and time.  Psychiatric:        Behavior: Behavior normal.      Musculoskeletal Exam: C-spine was in good range of motion.  Shoulder joints, elbow joints, wrist joints, MCPs.  PIPs, DIPs with good range of motion with no synovitis.  She had no tenderness over bilateral lateral epicondyle region.  Hip joints, knee joints, ankles, MTPs and PIPs with good range of motion with no synovitis.  CDAI Exam: CDAI Score: 0.2  Patient Global: 1 mm; Provider Global: 1 mm Swollen: 0 ; Tender: 0  Joint Exam 12/26/2020   No joint exam has been documented for this visit   There is currently no information documented on the homunculus. Go to the Rheumatology activity and complete the homunculus joint exam.  Investigation: No additional findings.  Imaging: No results found.  Recent Labs: Lab Results  Component Value Date   WBC 5.8 07/24/2020   HGB 13.4 07/24/2020   PLT 359 07/24/2020   NA 140 07/24/2020   K 3.8 07/24/2020   CL 102 07/24/2020   CO2 32 07/24/2020    GLUCOSE 111 (H) 07/24/2020   BUN 8 07/24/2020   CREATININE 0.74 07/24/2020   BILITOT 0.2 07/24/2020   ALKPHOS 72 04/16/2017   AST 13 07/24/2020   ALT 19 07/24/2020   PROT 7.2 07/24/2020   ALBUMIN 4.4 04/16/2017   CALCIUM 9.8 07/24/2020   GFRAA 118 07/24/2020    Speciality Comments: PLQ eye exam: 03/01/2020 WNL Dr. Gershon Crane. Follow up 1 year.  Procedures:  No procedures performed Allergies: Iodine, Red dye, Shellfish allergy, Latex, and Sulfa antibiotics   Assessment / Plan:     Visit Diagnoses: Seropositive rheumatoid arthritis of multiple sites ([place]) - RF negative, anti-CCP positive, positive synovitis on ultrasound: She had improvement in her joint symptoms since she has been on Plaquenil.  She denies any recent flares.  High risk medication use - Plaquenil 200 mg 1 tablet twice daily Monday through Friday only. PLQ eye exam: 03/01/2020  - Plan: CBC with Differential/Platelet, COMPLETE METABOLIC PANEL WITH GFR today and  then every 5 months to monitor for drug toxicity.  She has been getting eye examination on a yearly basis.  She is fully vaccinated against COVID-19.  A booster dose was advised and the instructions were placed in the AVS.  Lateral epicondylitis of both elbows-she has been using an interactive board in the past from.  She has developed some lateral epicondylitis.  The symptoms are improving.  Have given her a handout on exercises.  Family history of sarcoidosis  History of gastroesophageal reflux (GERD)  History of goiter  BMI 40.0-44.9, adult (HCC)-increased risk of heart disease with rheumatoid arthritis was discussed.  Instructions regarding diet and exercise were placed in the AVS.  Orders: Orders Placed This Encounter  Procedures  . CBC with Differential/Platelet  . COMPLETE METABOLIC PANEL WITH GFR   No orders of the defined types were placed in this encounter.  .  Follow-Up Instructions: Return in about 5 months (around 05/28/2021) for Rheumatoid  arthritis.   Bo Merino, MD  Note - This record has been created using Editor, commissioning.  Chart creation errors have been sought, but may not always  have been located. Such creation errors do not reflect on  the standard of medical care.

## 2020-12-26 ENCOUNTER — Other Ambulatory Visit: Payer: Self-pay

## 2020-12-26 ENCOUNTER — Ambulatory Visit: Payer: BC Managed Care – PPO | Admitting: Rheumatology

## 2020-12-26 ENCOUNTER — Encounter: Payer: Self-pay | Admitting: Rheumatology

## 2020-12-26 VITALS — BP 137/87 | HR 83 | Resp 17 | Ht 62.0 in | Wt 238.0 lb

## 2020-12-26 DIAGNOSIS — M7711 Lateral epicondylitis, right elbow: Secondary | ICD-10-CM

## 2020-12-26 DIAGNOSIS — M0579 Rheumatoid arthritis with rheumatoid factor of multiple sites without organ or systems involvement: Secondary | ICD-10-CM | POA: Diagnosis not present

## 2020-12-26 DIAGNOSIS — M7712 Lateral epicondylitis, left elbow: Secondary | ICD-10-CM

## 2020-12-26 DIAGNOSIS — Z79899 Other long term (current) drug therapy: Secondary | ICD-10-CM | POA: Diagnosis not present

## 2020-12-26 DIAGNOSIS — Z8719 Personal history of other diseases of the digestive system: Secondary | ICD-10-CM

## 2020-12-26 DIAGNOSIS — Z832 Family history of diseases of the blood and blood-forming organs and certain disorders involving the immune mechanism: Secondary | ICD-10-CM

## 2020-12-26 DIAGNOSIS — Z6841 Body Mass Index (BMI) 40.0 and over, adult: Secondary | ICD-10-CM

## 2020-12-26 DIAGNOSIS — Z8639 Personal history of other endocrine, nutritional and metabolic disease: Secondary | ICD-10-CM

## 2020-12-26 DIAGNOSIS — M62838 Other muscle spasm: Secondary | ICD-10-CM

## 2020-12-26 DIAGNOSIS — M7662 Achilles tendinitis, left leg: Secondary | ICD-10-CM

## 2020-12-26 NOTE — Patient Instructions (Addendum)
Standing Labs We placed an order today for your standing lab work.   Please have your standing labs drawn in September  If possible, please have your labs drawn 2 weeks prior to your appointment so that the provider can discuss your results at your appointment.  We have open lab daily Monday through Thursday from 1:30-4:30 PM and Friday from 1:30-4:00 PM at the office of Dr. Bo Merino, [facility].   Please be advised, all patients with office appointments requiring lab work will take precedents over walk-in lab work.  If possible, please come for your lab work on Monday and Friday afternoons, as you may experience shorter wait times. The office is located at [address] [place], [place], [place] [address] No appointment is necessary.   Labs are drawn by [facility]. Please bring your co-pay at the time of your lab draw.  You may receive a bill from [facility] for your lab work.  If you wish to have your labs drawn at another location, please call the office 24 hours in advance to send orders.  If you have any questions regarding directions or hours of operation,  please call [phone].   As a reminder, please drink plenty of water prior to coming for your lab work. Thanks!   COVID-19 vaccine recommendations:   COVID-19 vaccine is recommended for everyone (unless you are allergic to a vaccine component), even if you are on a medication that suppresses your immune system.   The recommendations are that the individuals on the immunosuppressive therapy should get their first 3 COVID-19 vaccines 1 month apart and then a fourth dose (booster) 3 months after the third dose  Do not take Tylenol or any anti-inflammatory medications (NSAIDs) 24 hours prior to the COVID-19 vaccination.   There is no direct evidence about the efficacy of the COVID-19 vaccine in individuals who are on medications that suppress the immune system.   Even if you are fully vaccinated, and you are  on any medications that suppress your immune system, please continue to wear a mask, maintain at least six feet social distance and practice hand hygiene.   If you develop a COVID-19 infection, please contact your PCP or our office to determine if you need monoclonal antibody infusion.  The booster vaccine is now available for immunocompromised patients.   Please see the following web sites for updated information.   [email or web address]  Heart Disease Prevention   Your inflammatory disease increases your risk of heart disease which includes heart attack, stroke, atrial fibrillation (irregular heartbeats), high blood pressure, heart failure and atherosclerosis (plaque in the arteries).  It is important to reduce your risk by:   . Keep blood pressure, cholesterol, and blood sugar at healthy levels   . Smoking Cessation   . Maintain a healthy weight  o BMI 20-25   . Eat a healthy diet  o Plenty of fresh fruit, vegetables, and whole grains  o Limit saturated fats, foods high in sodium, and added sugars  o DASH and Mediterranean diet   . Increase physical activity  o Recommend moderate physically activity for 150 minutes per week/ 30 minutes a day for five days a week These can be broken up into three separate ten-minute sessions during the day.   . Reduce Stress  . Meditation, slow breathing exercises, yoga, coloring books  . Dental visits twice a year   Elbow and Forearm Exercises Ask your health care provider which exercises are safe for you. Do exercises exactly  as told by your health care provider and adjust them as directed. It is normal to feel mild stretching, pulling, tightness, or discomfort as you do these exercises. Stop right away if you feel sudden pain or your pain gets worse. Do not begin these exercises until told by your health care provider. Range-of-motion exercises These exercises warm up your  muscles and joints and improve the movement and flexibility of your injured elbow and forearm. The exercises also help to relieve pain, numbness, and tingling. These exercises are done using the muscles in your injured elbow and forearm (active). Elbow flexion, active 1. Hold your left / right arm at your side, and bend your elbow (flexion) as far as you can using only your arm muscles. 2. Hold this position for __________ seconds. 3. Slowly return to the starting position. Repeat __________ times. Complete this exercise __________ times a day. Elbow extension, active 1. Hold your left / right arm at your side, and straighten your elbow (extension) as much as you can using only your arm muscles. 2. Hold this position for __________ seconds. 3. Slowly return to the starting position. Repeat __________ times. Complete this exercise __________ times a day. Active forearm rotation, supination This is an exercise in which you turn (rotate) your forearm palm up (supination). 1. Stand or sit with your elbows at your sides. 2. Bend your left / right elbow to a 90-degree angle (right angle). 3. Rotate your palm up until you feel a gentle stretch on the inside of your forearm. 4. Hold this position for __________ seconds. 5. Slowly return to the starting position. Repeat __________ times. Complete this exercise __________ times a day. Active forearm rotation, pronation This is an exercise in which you turn (rotate) your forearm palm down (pronation). 1. Stand or sit with your elbows at your sides. 2. Bend your left / right elbow to a 90-degree angle (right angle). 3. Rotate your palm down until you feel a gentle stretch on the top of your forearm. 4. Hold this position for __________ seconds. 5. Slowly return to the starting position. Repeat __________ times. Complete this exercise __________ times a day. Stretching exercises These exercises warm up your muscles and joints and improve the movement  and flexibility of your injured elbow and forearm. These exercises also help to relieve pain, numbness, and tingling. These exercises are done using your healthy elbow and forearm to help stretch the muscles in your injured elbow and forearm (active-assisted). Elbow flexion, active-assisted 1. Hold your left / right arm at your side, and bend your elbow (flexion) as much as you can using your left / right arm muscles. 2. Use your other hand to bend your left / right elbow farther. To do this, gently push up on your forearm until you feel a gentle stretch on the back of your elbow. 3. Hold this position for __________ seconds. 4. Slowly return to the starting position. Repeat __________ times. Complete this exercise __________ times a day.   Elbow extension, active-assisted 1. Hold your left / right arm at your side, and straighten your elbow (extension) as much as you can using your left / right arm muscles. 2. Use your other hand to straighten the left / right elbow farther. To do this, gently push down on your forearm until you feel a gentle stretch on the inside of your elbow. 3. Hold this position for __________ seconds. 4. Slowly return to the starting position. Repeat __________ times. Complete this exercise __________ times a  day.   Active-assisted forearm rotation, supination This is an exercise in which you turn (rotate) your forearm palm up (supination). 1. Sit with your left / right elbow bent in a 90-degree angle (right angle) with your forearm resting on a table. 2. Keeping your upper body and shoulder still, rotate your forearm so your palm faces upward. 3. Use your other hand to help rotate your forearm further until you feel a gentle to moderate stretch. 4. Hold this position for __________ seconds. 5. Slowly release the stretch and return to the starting position. Repeat __________ times. Complete this exercise __________ times a day. Active-assisted forearm rotation,  pronation This is an exercise in which you turn (rotate) your forearm palm down (pronation). 1. Sit with your left / right elbow bent in a 90-degree angle (right angle) with your forearm resting on a table. 2. Keeping your upper body and shoulder still, rotate your forearm so your palm faces the tabletop. 3. Use your other hand to help rotate your forearm further until you feel a gentle to moderate stretch. 4. Hold this position for __________ seconds. 5. Slowly release the stretch and return to the starting position. Repeat __________ times. Complete this exercise __________ times a day.   Passive elbow flexion, supine 1. Lie on your back (supine position). 2. Extend your left / right arm up in the air, bracing it with your other hand. 3. Let your left / right hand slowly lower toward your shoulder (passive flexion), while your elbow stays pointed toward the ceiling. You should feel a gentle stretch along the back of your upper arm and elbow. 4. If instructed by your health care provider, you may increase the intensity of your stretch by adding a small wrist weight or hand weight. 5. Hold this position for __________ seconds. 6. Slowly return to the starting position. Repeat __________ times. Complete this exercise __________ times a day. Passive elbow extension, supine 1. Lie on your back (supine position). Make sure that you are in a comfortable position that lets you relax your arm muscles. 2. Place a folded towel under your left / right upper arm so your elbow and shoulder are at the same height. Straighten your left / right arm so your elbow does not rest on the bed or towel. 3. Let the weight of your hand stretch your elbow (passive extension). Keep your arm and chest muscles relaxed. You should feel a stretch on the inside of your elbow. 4. If told by your health care provider, you may increase the intensity of your stretch by adding a small wrist weight or hand weight. 5. Hold this  position for __________ seconds. 6. Slowly release the stretch. Repeat __________ times. Complete this exercise __________ times a day.   Strengthening exercises These exercises build strength and endurance in your elbow and forearm. Endurance is the ability to use your muscles for a long time, even after they get tired. Elbow flexion, isometric 1. Stand or sit up straight. 2. Bend your left / right elbow in a 90-degree angle (right angle), and keep your forearm at the height of your waist. Your thumb should be pointed toward the ceiling (neutral forearm). 3. Place your other hand on top of your left / right forearm. Gently push down while you resist with your left / right arm (isometric flexion). Push as hard as you can with both arms without causing any pain or movement at your left / right elbow. 4. Hold this position for __________ seconds.  5. Slowly release the tension in both arms. Let your muscles relax completely before you repeat the exercise. Repeat __________ times. Complete this exercise __________ times a day.   Elbow extension, isometric 1. Stand or sit up straight. 2. Place your left / right arm so your palm faces your abdomen and is at the height of your waist. 3. Place your other hand on the underside of your left / right forearm. Gently push up while you resist with your left / right arm (isometric extension). Push as hard as you can with both arms without causing any pain or movement at your left / right elbow. 4. Hold this position for __________ seconds. 5. Slowly release the tension in both arms. Let your muscles relax completely before you repeat the exercise. Repeat __________ times. Complete this exercise __________ times a day.   Elbow flexion with forearm palm up 1. Sit on a firm chair without armrests, or stand up. 2. Place your left / right arm at your side with your elbow straight and your palm facing forward. 3. Holding a __________weight or gripping a rubber  exercise band or tubing, bend your elbow to bring your hand toward your shoulder (flexion). 4. Hold this position for __________ seconds. 5. Slowly return to the starting position. Repeat __________ times. Complete this exercise __________ times a day.   Elbow extension, active 1. Sit on a firm chair without armrests, or stand up. 2. Hold a rubber exercise band or tubing in both hands. 3. Keeping your upper arms at your sides, bring both hands up to your left / right shoulder. Keep your left / right hand just below your other hand. 4. Straighten your left / right elbow (extension) while keeping your other arm still. 5. Hold this position for __________ seconds. 6. Control the resistance of the band or tubing as you return to the starting position. Repeat __________ times. Complete this exercise __________ times a day.   Forearm rotation, supination 1. Sit with your left / right forearm supported on a table. Your elbow should be at waist height and bent at a 90-degree angle (right angle). 2. Gently grasp a lightweight hammer. 3. Rest your hand over the edge of the table with your palm facing down. 4. Without moving your left / right elbow, slowly rotate your forearm to turn your palm up toward the ceiling (supination). 5. Hold this position for __________ seconds. 6. Slowly return to the starting position. Repeat __________ times. Complete this exercise __________ times a day.   Forearm rotation, pronation 1. Sit with your left / right forearm supported on a table. Keep your elbow below shoulder height. 2. Gently grasp a lightweight hammer. 3. Rest your hand over the edge of the table with your palm facing up. 4. Without moving your left / right elbow, slowly rotate your forearm to turn your palm down toward the floor (pronation). 5. Hold this position for __________seconds. 6. Slowly return to the starting position. Repeat __________ times. Complete this exercise __________ times a day.    This information is not intended to replace advice given to you by your health care provider. Make sure you discuss any questions you have with your health care provider. Document Revised: 12/30/2018 Document Reviewed: 09/29/2018 Elsevier Patient Education  [employer].

## 2020-12-27 LAB — COMPLETE METABOLIC PANEL WITH GFR
AG Ratio: 1.5 (calc) (ref 1.0–2.5)
ALT: 19 U/L (ref 6–29)
AST: 15 U/L (ref 10–30)
Albumin: 4.4 g/dL (ref 3.6–5.1)
Alkaline phosphatase (APISO): 60 U/L (ref 31–125)
BUN: 11 mg/dL (ref 7–25)
CO2: 32 mmol/L (ref 20–32)
Calcium: 9.9 mg/dL (ref 8.6–10.2)
Chloride: 101 mmol/L (ref 98–110)
Creat: 0.69 mg/dL (ref 0.50–1.10)
GFR, Est African American: 127 mL/min/{1.73_m2} (ref >=60)
GFR, Est Non African American: 110 mL/min/{1.73_m2} (ref >=60)
Globulin: 2.9 g/dL (calc) (ref 1.9–3.7)
Glucose, Bld: 83 mg/dL (ref 65–99)
Potassium: 4.1 mmol/L (ref 3.5–5.3)
Sodium: 140 mmol/L (ref 135–146)
Total Bilirubin: 0.3 mg/dL (ref 0.2–1.2)
Total Protein: 7.3 g/dL (ref 6.1–8.1)

## 2020-12-27 LAB — CBC WITH DIFFERENTIAL/PLATELET
Absolute Monocytes: 551 cells/uL (ref 200–950)
Basophils Absolute: 29 cells/uL (ref 0–200)
Basophils Relative: 0.5 %
Eosinophils Absolute: 151 cells/uL (ref 15–500)
Eosinophils Relative: 2.6 %
HCT: 39.9 % (ref 35.0–45.0)
Hemoglobin: 13.5 g/dL (ref 11.7–15.5)
Lymphs Abs: 2337 cells/uL (ref 850–3900)
MCH: 30.3 pg (ref 27.0–33.0)
MCHC: 33.8 g/dL (ref 32.0–36.0)
MCV: 89.5 fL (ref 80.0–100.0)
MPV: 10.1 fL (ref 7.5–12.5)
Monocytes Relative: 9.5 %
Neutro Abs: 2732 cells/uL (ref 1500–7800)
Neutrophils Relative %: 47.1 %
Platelets: 330 10*3/uL (ref 140–400)
RBC: 4.46 10*6/uL (ref 3.80–5.10)
RDW: 13.1 % (ref 11.0–15.0)
Total Lymphocyte: 40.3 %
WBC: 5.8 10*3/uL (ref 3.8–10.8)

## 2021-03-08 ENCOUNTER — Other Ambulatory Visit: Payer: Self-pay | Admitting: Physician Assistant

## 2021-03-08 DIAGNOSIS — M0579 Rheumatoid arthritis with rheumatoid factor of multiple sites without organ or systems involvement: Secondary | ICD-10-CM

## 2021-03-08 NOTE — Telephone Encounter (Signed)
Last Visit: 12/26/2020  Next Visit: 05/29/2021  Labs: 12/26/2020 CBC and CMP WNL  Eye exam: 02/28/2021 WNL   Current Dose per office note 12/26/2020: Plaquenil 200 mg 1 tablet twice daily Monday through Friday only  DX: Seropositive rheumatoid arthritis of multiple sites  Last Fill: 07/24/2020  Okay to refill Plaquenil?

## 2021-04-04 ENCOUNTER — Telehealth: Payer: Self-pay

## 2021-04-04 NOTE — Telephone Encounter (Signed)
I called patient, patient will contact PCP for next treatment option, patient takes PLQ.

## 2021-04-04 NOTE — Telephone Encounter (Signed)
Patient called stating she tested positive for Covid today.  Patient states "every joint in her body is painful."  Patient requested a return call to schedule antibody infusion.

## 2021-05-06 ENCOUNTER — Other Ambulatory Visit: Payer: Self-pay | Admitting: Obstetrics and Gynecology

## 2021-05-06 DIAGNOSIS — Z1231 Encounter for screening mammogram for malignant neoplasm of breast: Secondary | ICD-10-CM

## 2021-05-15 NOTE — Progress Notes (Signed)
Office Visit Note  Patient: Marie [name]             Date of Birth: [birth year]           MRN: [id number]             PCP: Wenda Low, MD Referring: Wenda Low, MD Visit Date: 05/29/2021 Occupation: '@GUAROCC'$ @  Subjective:  Medication monitoring   History of Present Illness: Marie [name] is a 40 y.o. female with history of seropositive rheumatoid arthritis.  Patient is currently taking Plaquenil 200 mg 1 tablet by mouth twice daily Monday through Friday.  She denies missing any doses of Plaquenil recently.  She has not had any recent rheumatoid arthritis flares.  She denies any joint pain or joint swelling at this time.  She states that she was diagnosed with COVID-19 in July at which time she was prescribed paxlovid.  She denies any residual symptoms.  She states that she did notice increased fluid retention in bilateral ankles after having COVID.  She followed up with her PCP who increased the dose of HCTZ which has improved her symptoms.  She has not been experiencing any pain in her ankle joints. She is planning on receiving the second COVID-19 vaccine booster as well as the annual influenza vaccine.   Activities of Daily Living:  Patient reports morning stiffness for 0  none .   Patient Denies nocturnal pain.  Difficulty dressing/grooming: Denies Difficulty climbing stairs: Denies Difficulty getting out of chair: Denies Difficulty using hands for taps, buttons, cutlery, and/or writing: Denies  Review of Systems  Constitutional:  Negative for fatigue.  HENT:  Negative for mouth sores, mouth dryness and nose dryness.   Eyes:  Negative for pain, visual disturbance and dryness.  Respiratory:  Negative for cough, hemoptysis, shortness of breath and difficulty breathing.   Cardiovascular:  Positive for swelling in legs/feet. Negative for chest pain, palpitations and hypertension.  Gastrointestinal:  Negative for blood in stool, constipation and diarrhea.  Endocrine:  Negative for cold intolerance and increased urination.  Genitourinary:  Negative for difficulty urinating and painful urination.  Musculoskeletal:  Negative for joint pain, joint pain, joint swelling, myalgias, muscle weakness, morning stiffness, muscle tenderness and myalgias.  Skin:  Negative for color change, pallor, rash, hair loss, nodules/bumps, skin tightness, ulcers and sensitivity to sunlight.  Allergic/Immunologic: Negative for susceptible to infections.  Neurological:  Negative for dizziness, numbness, headaches and weakness.  Hematological:  Negative for bruising/bleeding tendency and swollen glands.  Psychiatric/Behavioral:  Negative for depressed mood and sleep disturbance. The patient is not nervous/anxious.    PMFS History:  Patient Active Problem List   Diagnosis Date Noted   Seropositive rheumatoid arthritis of multiple sites ([place]) 11/16/2016   History of goiter 11/16/2016   Family history of sarcoidosis 11/16/2016   History of gastroesophageal reflux (GERD) 11/03/2016   Abnormal serum angiotensin-converting enzyme level,has had positive rheumatoid factor, and anti-DNAse B in the past.   11/03/2016   High serum angiotensin converting enzyme (ACE) 11/03/2016   Goiter 08/05/2016   Gastroesophageal reflux disease without esophagitis 08/05/2016    Past Medical History:  Diagnosis Date   Enlarged thyroid    no current med.   GERD (gastroesophageal reflux disease)    Hypertension    states under control with meds., has been on med. since 03/2017   Macromastia 06/2017   Rheumatoid arthritis ([place])    Sleep apnea    no CPAP use but did bring CPAP machine with her  Vertigo     Family History  Problem Relation Age of Onset   Hypertension Mother    Arthritis Mother    Sarcoidosis Mother    Diabetes Other    Diabetes Maternal Grandmother    Congestive Heart Failure Maternal Grandmother    Cancer Maternal Grandfather    Past Surgical History:  Procedure Laterality  Date   BREAST REDUCTION SURGERY Bilateral 07/06/2017   Procedure: MAMMARY REDUCTION  (BREAST);  Surgeon: Cristine Polio, MD;  Location: [facility];  Service: Plastics;  Laterality: Bilateral;   CHROMOPERTUBATION  05/30/2011   HYSTEROSCOPY DIAGNOSTIC  05/30/2011   LAPAROSCOPIC LYSIS OF ADHESIONS  05/30/2011   LAPAROSCOPIC UNILATERAL SALPINGECTOMY Right 09/19/2010   Social History   Social History Narrative   Not on file   Immunization History  Administered Date(s) Administered   PFIZER(Purple Top)SARS-COV-2 Vaccination 11/19/2019, 12/10/2019, 08/10/2020     Objective: Vital [name]: BP 112/75 (BP Location: Left Arm, Patient Position: Sitting, Cuff Size: Normal)   Pulse 82   Resp 16   Ht '5\' 2"'$  (1.575 m)   Wt 241 lb 6.4 oz (109.5 kg)   BMI 44.15 kg/m    Physical Exam Vitals and nursing note reviewed.  Constitutional:      Appearance: She is well-developed.  HENT:     Head: Normocephalic and atraumatic.  Eyes:     Conjunctiva/sclera: Conjunctivae normal.  Pulmonary:     Effort: Pulmonary effort is normal.  Abdominal:     Palpations: Abdomen is soft.  Musculoskeletal:     Cervical back: Normal range of motion.  Skin:    General: Skin is warm and dry.     Capillary Refill: Capillary refill takes less than 2 seconds.  Neurological:     Mental Status: She is alert and oriented to person, place, and time.  Psychiatric:        Behavior: Behavior normal.     Musculoskeletal Exam: C-spine, thoracic spine, lumbar spine have good range of motion with no discomfort.  Shoulder joints, elbow joints, wrist joints, MCPs, PIPs, DIPs have good range of motion with no synovitis.  She is able to make a complete fist bilaterally.  Hip joints have good range of motion with no discomfort.  Knee joints have good range of motion with no warmth or effusion.  Ankle joints have good range of motion with no tenderness or joint swelling.  Mild pedal edema noted bilaterally.  CDAI  Exam: CDAI Score: 0.2  Patient Global: 1 mm; Provider Global: 1 mm Swollen: 0 ; Tender: 0  Joint Exam 05/29/2021   No joint exam has been documented for this visit   There is currently no information documented on the homunculus. Go to the Rheumatology activity and complete the homunculus joint exam.  Investigation: No additional findings.  Imaging: MM 3D SCREEN BREAST BILATERAL  Result Date: 05/23/2021 CLINICAL DATA:  Screening. EXAM: DIGITAL SCREENING BILATERAL MAMMOGRAM WITH TOMOSYNTHESIS AND CAD TECHNIQUE: Bilateral screening digital craniocaudal and mediolateral oblique mammograms were obtained. Bilateral screening digital breast tomosynthesis was performed. The images were evaluated with computer-aided detection. COMPARISON:  None. ACR Breast Density Category b: There are scattered areas of fibroglandular density. FINDINGS: In the left breast, possible distortion warrants further evaluation. In the right breast, no findings suspicious for malignancy. IMPRESSION: Further evaluation is suggested for possible distortion in the left breast. RECOMMENDATION: Diagnostic mammogram and possibly ultrasound of the left breast. (Code:[id number]) The patient will be contacted regarding the findings, and additional imaging will be scheduled. BI-RADS  CATEGORY  0: Incomplete. Need additional imaging evaluation and/or prior mammograms for comparison. Electronically Signed   By: Lillia Mountain M.D.   On: 05/23/2021 14:45    Recent Labs: Lab Results  Component Value Date   WBC 5.8 12/26/2020   HGB 13.5 12/26/2020   PLT 330 12/26/2020   NA 140 12/26/2020   K 4.1 12/26/2020   CL 101 12/26/2020   CO2 32 12/26/2020   GLUCOSE 83 12/26/2020   BUN 11 12/26/2020   CREATININE 0.69 12/26/2020   BILITOT 0.3 12/26/2020   ALKPHOS 72 04/16/2017   AST 15 12/26/2020   ALT 19 12/26/2020   PROT 7.3 12/26/2020   ALBUMIN 4.4 04/16/2017   CALCIUM 9.9 12/26/2020   GFRAA 127 12/26/2020    Speciality Comments: PLQ eye  exam: 02/28/2021 WNL Dr. Gershon Crane. Follow up 1 year.  Procedures:  No procedures performed Allergies: Iodine, Red dye, Shellfish allergy, Latex, and Sulfa antibiotics   Assessment / Plan:     Visit Diagnoses: Seropositive rheumatoid arthritis of multiple sites ([place]) - RF negative, anti-CCP positive, positive synovitis on ultrasound: She has no joint tenderness or synovitis on examination today.  She has not had any recent rheumatoid arthritis flares.  She is clinically doing well taking Plaquenil 200 mg 1 tablet by mouth twice daily Monday through Friday.  She has not had any nocturnal pain or morning stiffness.  No difficulty with ADLs.  She continues to find Plaquenil to be effective at managing her rheumatoid arthritis.  No medication changes will be made at this time.  She was advised to notify us if she develops increased joint pain or joint swelling.  She will follow-up in the office in 5 months.  High risk medication use - Plaquenil 200 mg 1 tablet twice daily Monday through Friday only. CBC and CMP were drawn on 12/26/2020.  She is due to update lab work today.  Orders for CBC and CMP were released.- Plan: CBC with Differential/Platelet, COMPLETE METABOLIC PANEL WITH GFR PLQ eye exam: 02/28/2021 WNL Dr. Gershon Crane. Follow up 1 year. She was diagnosed with COVID-19 in July 2022.  She was treated with Paxlovid. She plans on receiving the second COVID-19 booster as well as the annual influenza vaccine.  Lateral epicondylitis of both elbows: Resolved.   Other medical conditions are listed as follows:  Family history of sarcoidosis  History of gastroesophageal reflux (GERD)  History of goiter  BMI 40.0-44.9, adult ([place])  Orders: Orders Placed This Encounter  Procedures   CBC with Differential/Platelet   COMPLETE METABOLIC PANEL WITH GFR   No orders of the defined types were placed in this encounter.    Follow-Up Instructions: Return in about 5 months (around 10/29/2021) for Rheumatoid  arthritis.   Ofilia Neas, PA-C  Note - This record has been created using Dragon software.  Chart creation errors have been sought, but may not always  have been located. Such creation errors do not reflect on  the standard of medical care.

## 2021-05-17 ENCOUNTER — Other Ambulatory Visit: Payer: Self-pay

## 2021-05-17 ENCOUNTER — Ambulatory Visit
Admission: RE | Admit: 2021-05-17 | Discharge: 2021-05-17 | Disposition: A | Discharge status: Home / Self Care | Payer: BC Managed Care – PPO | Source: Ambulatory Visit | Attending: Obstetrics and Gynecology | Admitting: Obstetrics and Gynecology

## 2021-05-17 DIAGNOSIS — Z1231 Encounter for screening mammogram for malignant neoplasm of breast: Secondary | ICD-10-CM

## 2021-05-24 ENCOUNTER — Other Ambulatory Visit: Payer: Self-pay | Admitting: Obstetrics and Gynecology

## 2021-05-24 DIAGNOSIS — R928 Other abnormal and inconclusive findings on diagnostic imaging of breast: Secondary | ICD-10-CM

## 2021-05-29 ENCOUNTER — Ambulatory Visit: Payer: BC Managed Care – PPO | Admitting: Physician Assistant

## 2021-05-29 ENCOUNTER — Other Ambulatory Visit: Payer: Self-pay

## 2021-05-29 ENCOUNTER — Encounter: Payer: Self-pay | Admitting: Physician Assistant

## 2021-05-29 VITALS — BP 112/75 | HR 82 | Resp 16 | Ht 62.0 in | Wt 241.4 lb

## 2021-05-29 DIAGNOSIS — Z8639 Personal history of other endocrine, nutritional and metabolic disease: Secondary | ICD-10-CM

## 2021-05-29 DIAGNOSIS — M0579 Rheumatoid arthritis with rheumatoid factor of multiple sites without organ or systems involvement: Secondary | ICD-10-CM

## 2021-05-29 DIAGNOSIS — M7711 Lateral epicondylitis, right elbow: Secondary | ICD-10-CM

## 2021-05-29 DIAGNOSIS — Z79899 Other long term (current) drug therapy: Secondary | ICD-10-CM | POA: Diagnosis not present

## 2021-05-29 DIAGNOSIS — M7712 Lateral epicondylitis, left elbow: Secondary | ICD-10-CM

## 2021-05-29 DIAGNOSIS — Z8719 Personal history of other diseases of the digestive system: Secondary | ICD-10-CM

## 2021-05-29 DIAGNOSIS — Z832 Family history of diseases of the blood and blood-forming organs and certain disorders involving the immune mechanism: Secondary | ICD-10-CM

## 2021-05-29 DIAGNOSIS — Z6841 Body Mass Index (BMI) 40.0 and over, adult: Secondary | ICD-10-CM

## 2021-05-30 LAB — COMPLETE METABOLIC PANEL WITH GFR
AG Ratio: 1.5 (calc) (ref 1.0–2.5)
ALT: 13 U/L (ref 6–29)
AST: 17 U/L (ref 10–30)
Albumin: 4.2 g/dL (ref 3.6–5.1)
Alkaline phosphatase (APISO): 45 U/L (ref 31–125)
BUN: 12 mg/dL (ref 7–25)
CO2: 28 mmol/L (ref 20–32)
Calcium: 9.3 mg/dL (ref 8.6–10.2)
Chloride: 98 mmol/L (ref 98–110)
Creat: 0.75 mg/dL (ref 0.50–0.99)
Globulin: 2.8 g/dL (calc) (ref 1.9–3.7)
Glucose, Bld: 117 mg/dL — ABNORMAL HIGH (ref 65–99)
Potassium: 3.7 mmol/L (ref 3.5–5.3)
Sodium: 137 mmol/L (ref 135–146)
Total Bilirubin: 0.2 mg/dL (ref 0.2–1.2)
Total Protein: 7 g/dL (ref 6.1–8.1)
eGFR: 103 mL/min/{1.73_m2} (ref >=60)

## 2021-05-30 LAB — CBC WITH DIFFERENTIAL/PLATELET
Absolute Monocytes: 529 cells/uL (ref 200–950)
Basophils Absolute: 32 cells/uL (ref 0–200)
Basophils Relative: 0.5 %
Eosinophils Absolute: 132 cells/uL (ref 15–500)
Eosinophils Relative: 2.1 %
HCT: 39.4 % (ref 35.0–45.0)
Hemoglobin: 13.3 g/dL (ref 11.7–15.5)
Lymphs Abs: 2533 cells/uL (ref 850–3900)
MCH: 30.2 pg (ref 27.0–33.0)
MCHC: 33.8 g/dL (ref 32.0–36.0)
MCV: 89.5 fL (ref 80.0–100.0)
MPV: 9.7 fL (ref 7.5–12.5)
Monocytes Relative: 8.4 %
Neutro Abs: 3074 cells/uL (ref 1500–7800)
Neutrophils Relative %: 48.8 %
Platelets: 362 10*3/uL (ref 140–400)
RBC: 4.4 10*6/uL (ref 3.80–5.10)
RDW: 13.1 % (ref 11.0–15.0)
Total Lymphocyte: 40.2 %
WBC: 6.3 10*3/uL (ref 3.8–10.8)

## 2021-05-30 NOTE — Progress Notes (Signed)
Glucose is 117. Rest of CMP WNL.  CBC WNL

## 2021-06-10 ENCOUNTER — Ambulatory Visit
Admission: RE | Admit: 2021-06-10 | Discharge: 2021-06-10 | Disposition: A | Discharge status: Home / Self Care | Payer: BC Managed Care – PPO | Source: Ambulatory Visit | Attending: Obstetrics and Gynecology | Admitting: Obstetrics and Gynecology

## 2021-06-10 ENCOUNTER — Other Ambulatory Visit: Payer: Self-pay

## 2021-06-10 DIAGNOSIS — R928 Other abnormal and inconclusive findings on diagnostic imaging of breast: Secondary | ICD-10-CM

## 2021-08-07 ENCOUNTER — Other Ambulatory Visit: Payer: Self-pay | Admitting: Physician Assistant

## 2021-08-07 DIAGNOSIS — M0579 Rheumatoid arthritis with rheumatoid factor of multiple sites without organ or systems involvement: Secondary | ICD-10-CM

## 2021-08-07 NOTE — Telephone Encounter (Signed)
Next Visit: 10/29/2021  Last Visit: 05/29/2021  Labs: 05/29/2021 Glucose is 117. Rest of CMP WNL.  CBC WNL  Eye exam: 02/28/2021 WNL    Current Dose per office note 05/29/2021: Plaquenil 200 mg 1 tablet twice daily Monday through Friday only  [id number] rheumatoid arthritis of multiple sites   Last Fill: 03/08/2021  Okay to refill Plaquenil?

## 2021-10-18 NOTE — Progress Notes (Signed)
Office Visit Note  Patient: Marie [name]             Date of Birth: 03-01-1981           MRN: [id number]             PCP: Wenda Low, MD Referring: Wenda Low, MD Visit Date: 10/29/2021 Occupation: @GUAROCC @  Subjective:  Medication management   History of Present Illness: Marie [name] is a 41 y.o. female with a history of seropositive rheumatoid arthritis.  She has been taking hydroxychloroquine on a regular basis.  She states she went back to working at the [employer].  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  She has been having pain and discomfort in her bilateral elbows and her knee joints.  She also has some discomfort in her wrist joints.  She has not noticed any joint swelling.  She is requesting papers filled out for [facility].  She works Monday through Thursday 10 hours a day and 8 hours on Friday and Saturday.  She wants FMLA paperwork for intermittent flares as she has to work for long hours.  She also wants FMLA for the appointments.  Activities of Daily Living:  Patient reports morning stiffness for 15 minutes.   Patient Denies nocturnal pain.  Difficulty dressing/grooming: Denies Difficulty climbing stairs: Reports Difficulty getting out of chair: Reports Difficulty using hands for taps, buttons, cutlery, and/or writing: Reports  Review of Systems  Constitutional:  Positive for fatigue.  HENT:  Negative for mouth sores, mouth dryness and nose dryness.   Eyes:  Negative for pain, itching and dryness.  Respiratory:  Negative for shortness of breath and difficulty breathing.   Cardiovascular:  Negative for chest pain and palpitations.  Gastrointestinal:  Negative for blood in stool, constipation and diarrhea.  Endocrine: Negative for increased urination.  Genitourinary:  Negative for difficulty urinating.  Musculoskeletal:  Positive for joint pain, joint pain and morning stiffness. Negative for joint swelling, myalgias,  muscle tenderness and myalgias.  Skin:  Positive for rash. Negative for color change, redness and sensitivity to sunlight.  Allergic/Immunologic: Positive for susceptible to infections.  Neurological:  Positive for weakness. Negative for dizziness, numbness, headaches and memory loss.  Hematological:  Positive for bruising/bleeding tendency. Negative for swollen glands.  Psychiatric/Behavioral:  Negative for confusion.    PMFS History:  Patient Active Problem List   Diagnosis Date Noted   Seropositive rheumatoid arthritis of multiple sites ([place]) 11/16/2016   History of goiter 11/16/2016   Family history of sarcoidosis 11/16/2016   History of gastroesophageal reflux (GERD) 11/03/2016   Abnormal serum angiotensin-converting enzyme level,has had positive rheumatoid factor, and anti-DNAse B in the past.   11/03/2016   High serum angiotensin converting enzyme (ACE) 11/03/2016   Goiter 08/05/2016   Gastroesophageal reflux disease without esophagitis 08/05/2016    Past Medical History:  Diagnosis Date   Enlarged thyroid    no current med.   GERD (gastroesophageal reflux disease)    Hypertension    states under control with meds., has been on med. since 03/2017   Macromastia 06/2017   Rheumatoid arthritis ([place])    Sleep apnea    no CPAP use but did bring CPAP machine with her   Vertigo     Family History  Problem Relation Age of Onset   Hypertension Mother    Arthritis Mother    Sarcoidosis Mother    Diabetes Other  Diabetes Maternal Grandmother    Congestive Heart Failure Maternal Grandmother    Cancer Maternal Grandfather    Past Surgical History:  Procedure Laterality Date   BREAST REDUCTION SURGERY Bilateral 07/06/2017   Procedure: MAMMARY REDUCTION  (BREAST);  Surgeon: Cristine Polio, MD;  Location: [facility];  Service: Plastics;  Laterality: Bilateral;   CHROMOPERTUBATION  05/30/2011   HYSTEROSCOPY DIAGNOSTIC  05/30/2011   LAPAROSCOPIC LYSIS OF  ADHESIONS  05/30/2011   LAPAROSCOPIC UNILATERAL SALPINGECTOMY Right 09/19/2010   Social History   Social History Narrative   Not on file   Immunization History  Administered Date(s) Administered   PFIZER(Purple Top)SARS-COV-2 Vaccination 11/19/2019, 12/10/2019, 08/10/2020     Objective: Vital [name]: BP (!) 143/91 (BP Location: Left Arm, Patient Position: Sitting, Cuff Size: Large)    Pulse 69    Ht 5\' 2"  (1.575 m)    Wt 245 lb 3.2 oz (111.2 kg)    BMI 44.85 kg/m    Physical Exam Vitals and nursing note reviewed.  Constitutional:      Appearance: She is well-developed.  HENT:     Head: Normocephalic and atraumatic.  Eyes:     Conjunctiva/sclera: Conjunctivae normal.  Cardiovascular:     Rate and Rhythm: Normal rate and regular rhythm.     Heart sounds: Normal heart sounds.  Pulmonary:     Effort: Pulmonary effort is normal.     Breath sounds: Normal breath sounds.  Abdominal:     General: Bowel sounds are normal.     Palpations: Abdomen is soft.  Musculoskeletal:     Cervical back: Normal range of motion.  Lymphadenopathy:     Cervical: No cervical adenopathy.  Skin:    General: Skin is warm and dry.     Capillary Refill: Capillary refill takes less than 2 seconds.  Neurological:     Mental Status: She is alert and oriented to person, place, and time.  Psychiatric:        Behavior: Behavior normal.     Musculoskeletal Exam: C-spine was in good range of motion.  Shoulder joints, elbow joints, wrist joints, MCPs PIPs and DIPs with good range of motion with no synovitis.  She had tenderness over bilateral lateral epicondyle region.  She had tenderness over bilateral wrist joints.  Hip joints and knee joints with good range of motion.  She had discomfort range of motion of her knee joints without any warmth swelling or effusion.  She had tenderness across MTPs but no synovitis was noted.  CDAI Exam: CDAI Score: 0.6  Patient Global: 3 mm; Provider Global: 3 mm Swollen: 0  ; Tender: 0  Joint Exam 10/29/2021   No joint exam has been documented for this visit   There is currently no information documented on the homunculus. Go to the Rheumatology activity and complete the homunculus joint exam.  Investigation: No additional findings.  Imaging: XR Foot 2 Views Left  Result Date: 10/29/2021 No MTP PIP or DIP narrowing was noted.  No intertarsal, tibiotalar or subtalar joint space narrowing was noted.  Inferior calcaneal spur was noted.  No erosive changes were noted. Impression: Degenerative changes were noted in the foot.  XR Foot 2 Views Right  Result Date: 10/29/2021 No MTP PIP or DIP narrowing was noted.  No intertarsal, tibiotalar or subtalar joint space narrowing was noted.  Inferior calcaneal spur was noted. Impression: Degenerative changes were noted in the foot.  XR Hand 2 View Left  Result Date: 10/29/2021 Mild PIP and DIP  narrowing was noted.  No MCP, intercarpal or radiocarpal joint space narrowing was noted.  No erosive changes were noted. 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  XR Hand 2 View Right  Result Date: 10/29/2021 Mild PIP and DIP narrowing was noted.  No MCP, intercarpal or radiocarpal joint space narrowing was noted.  No erosive changes were noted. 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  XR KNEE 3 VIEW LEFT  Result Date: 10/29/2021 No medial or lateral compartment narrowing was noted.  No patellofemoral narrowing was noted.  No chondrocalcinosis was noted. Impression: Unremarkable x-ray of the knee.  XR KNEE 3 VIEW RIGHT  Result Date: 10/29/2021 No medial or lateral compartment narrowing was noted.  No patellofemoral narrowing was noted.  No chondrocalcinosis was noted. Impression: Unremarkable x-ray of the knee.   Recent Labs: Lab Results  Component Value Date   WBC 6.3 05/29/2021   HGB 13.3 05/29/2021   PLT 362 05/29/2021   NA 137 05/29/2021   K  3.7 05/29/2021   CL 98 05/29/2021   CO2 28 05/29/2021   GLUCOSE 117 (H) 05/29/2021   BUN 12 05/29/2021   CREATININE 0.75 05/29/2021   BILITOT 0.2 05/29/2021   ALKPHOS 72 04/16/2017   AST 17 05/29/2021   ALT 13 05/29/2021   PROT 7.0 05/29/2021   ALBUMIN 4.4 04/16/2017   CALCIUM 9.3 05/29/2021   GFRAA 127 12/26/2020    Speciality Comments: PLQ eye exam: 02/28/2021 WNL Dr. Gershon Crane. Follow up 1 year.  Procedures:  No procedures performed Allergies: Iodine, Red dye, Shellfish allergy, Latex, and Sulfa antibiotics   Assessment / Plan:     Visit Diagnoses: Seropositive rheumatoid arthritis of multiple sites ([place]) - RF negative, anti-CCP positive, positive synovitis on ultrasound:  -She complains of increased pain and discomfort in all of her joints.  She has been working long hours which has been very difficult for her.  She states she has been having frequent flares.  I did not notice any synovitis on the examination.  I will recheck her sed rate and antibody titers.  She will continue hydroxychloroquine as prescribed.  Plan: Sedimentation rate, Rheumatoid factor, Cyclic citrul peptide antibody, IgG  High risk medication use - Plaquenil 200 mg 1 tablet twice daily Monday through Friday only. PLQ eye exam: 02/28/2021  - Plan: CBC with Differential/Platelet, COMPLETE METABOLIC PANEL WITH GFR today and every 5 months.  Rec mentations regarding mineralizations were placed in the AVS.  Pain in both hands -she complains of pain and discomfort in her bilateral hands.  She had no synovitis on examination.  We will obtain x-rays today.  Plan: XR Hand 2 View Right, XR Hand 2 View Left.  Early osteoarthritic changes were noted in the x-rays.  Lateral epicondylitis of both elbows -she is experiencing recurrence of lateral epicondylitis.  She was advised bilateral tennis elbow brace.  Chronic pain of both knees -she complains of increased pain and discomfort in her knee joints.  No warmth swelling or  effusion was noted.  Plan: XR KNEE 3 VIEW RIGHT, XR KNEE 3 VIEW LEFT.  X-rays of bilateral knee joints were unremarkable.  Pain in both feet -she has been experiencing increased discomfort in her feet.  Plan: XR Foot 2 Views Right, XR Foot 2 Views Left.  Bilateral calcaneal spurs were noted.  History of gastroesophageal reflux (GERD)  History of goiter  BMI 40.0-44.9, adult ([facility])-weight loss diet and exercise was emphasized.  Family history  of sarcoidosis  FMLA-patient is applying for FMLA.  I will fill the form.  She has to work long hours which is causing flares.  She also needs time off for the appointments.   Orders: Orders Placed This Encounter  Procedures   XR Hand 2 View Right   XR Hand 2 View Left   XR Foot 2 Views Right   XR Foot 2 Views Left   XR KNEE 3 VIEW RIGHT   XR KNEE 3 VIEW LEFT   CBC with Differential/Platelet   COMPLETE METABOLIC PANEL WITH GFR   Sedimentation rate   Rheumatoid factor   Cyclic citrul peptide antibody, IgG   No orders of the defined types were placed in this encounter.    Follow-Up Instructions: Return in about 3 months (around 01/26/2022) for Rheumatoid arthritis.   Bo Merino, MD  Note - This record has been created using Editor, commissioning.  Chart creation errors have been sought, but may not always  have been located. Such creation errors do not reflect on  the standard of medical care.

## 2021-10-29 ENCOUNTER — Other Ambulatory Visit: Payer: Self-pay

## 2021-10-29 ENCOUNTER — Ambulatory Visit: Payer: Self-pay

## 2021-10-29 ENCOUNTER — Encounter: Payer: Self-pay | Admitting: Rheumatology

## 2021-10-29 ENCOUNTER — Ambulatory Visit: Payer: BC Managed Care – PPO | Admitting: Rheumatology

## 2021-10-29 ENCOUNTER — Telehealth: Payer: Self-pay

## 2021-10-29 VITALS — BP 143/91 | HR 69 | Ht 62.0 in | Wt 245.2 lb

## 2021-10-29 DIAGNOSIS — M79641 Pain in right hand: Secondary | ICD-10-CM

## 2021-10-29 DIAGNOSIS — G8929 Other chronic pain: Secondary | ICD-10-CM | POA: Diagnosis not present

## 2021-10-29 DIAGNOSIS — M79672 Pain in left foot: Secondary | ICD-10-CM | POA: Diagnosis not present

## 2021-10-29 DIAGNOSIS — M0579 Rheumatoid arthritis with rheumatoid factor of multiple sites without organ or systems involvement: Secondary | ICD-10-CM | POA: Diagnosis not present

## 2021-10-29 DIAGNOSIS — M25562 Pain in left knee: Secondary | ICD-10-CM | POA: Diagnosis not present

## 2021-10-29 DIAGNOSIS — Z6841 Body Mass Index (BMI) 40.0 and over, adult: Secondary | ICD-10-CM

## 2021-10-29 DIAGNOSIS — M7712 Lateral epicondylitis, left elbow: Secondary | ICD-10-CM

## 2021-10-29 DIAGNOSIS — Z8719 Personal history of other diseases of the digestive system: Secondary | ICD-10-CM

## 2021-10-29 DIAGNOSIS — Z79899 Other long term (current) drug therapy: Secondary | ICD-10-CM | POA: Diagnosis not present

## 2021-10-29 DIAGNOSIS — M7711 Lateral epicondylitis, right elbow: Secondary | ICD-10-CM | POA: Diagnosis not present

## 2021-10-29 DIAGNOSIS — Z8639 Personal history of other endocrine, nutritional and metabolic disease: Secondary | ICD-10-CM

## 2021-10-29 DIAGNOSIS — M79671 Pain in right foot: Secondary | ICD-10-CM

## 2021-10-29 DIAGNOSIS — Z832 Family history of diseases of the blood and blood-forming organs and certain disorders involving the immune mechanism: Secondary | ICD-10-CM

## 2021-10-29 DIAGNOSIS — M79642 Pain in left hand: Secondary | ICD-10-CM

## 2021-10-29 DIAGNOSIS — M25561 Pain in right knee: Secondary | ICD-10-CM

## 2021-10-29 NOTE — Telephone Encounter (Signed)
Advised patient of x-ray results per Dr. Estanislado Pandy. Patient verbalized understanding.

## 2021-10-29 NOTE — Patient Instructions (Addendum)
Elbow and Forearm Exercises Ask your health care provider which exercises are safe for you. Do exercises exactly as told by your health care provider and adjust them as directed. It is normal to feel mild stretching, pulling, tightness, or discomfort as you do these exercises. Stop right away if you feel sudden pain or your pain gets worse. Do not begin these exercises until told by your health care provider. Range-of-motion exercises These exercises warm up your muscles and joints and improve the movement and flexibility of your injured elbow and forearm. The exercises also help to relieve pain, numbness, and tingling. These exercises are done using the muscles in your injured elbow and forearm (active). Elbow flexion, active Hold your left / right arm at your side, and bend your elbow (flexion) as far as you can using only your arm muscles. Hold this position for __________ seconds. Slowly return to the starting position. Repeat __________ times. Complete this exercise __________ times a day. Elbow extension, active Hold your left / right arm at your side, and straighten your elbow (extension) as much as you can using only your arm muscles. Hold this position for __________ seconds. Slowly return to the starting position. Repeat __________ times. Complete this exercise __________ times a day. Active forearm rotation, supination This is an exercise in which you turn (rotate) your forearm palm up (supination). Stand or sit with your elbows at your sides. Bend your left / right elbow to a 90-degree angle (right angle). Rotate your palm up until you feel a gentle stretch on the inside of your forearm. Hold this position for __________ seconds. Slowly return to the starting position. Repeat __________ times. Complete this exercise __________ times a day. Active forearm rotation, pronation This is an exercise in which you turn (rotate) your forearm palm down (pronation). Stand or sit with your  elbows at your sides. Bend your left / right elbow to a 90-degree angle (right angle). Rotate your palm down until you feel a gentle stretch on the top of your forearm. Hold this position for __________ seconds. Slowly return to the starting position. Repeat __________ times. Complete this exercise __________ times a day. Stretching exercises These exercises warm up your muscles and joints and improve the movement and flexibility of your injured elbow and forearm. These exercises also help to relieve pain, numbness, and tingling. These exercises are done using your healthy elbow and forearm to help stretch the muscles in your injured elbow and forearm (active-assisted). Elbow flexion, active-assisted  Hold your left / right arm at your side, and bend your elbow (flexion) as much as you can using your left / right arm muscles. Use your other hand to bend your left / right elbow farther. To do this, gently push up on your forearm until you feel a gentle stretch on the back of your elbow. Hold this position for __________ seconds. Slowly return to the starting position. Repeat __________ times. Complete this exercise __________ times a day. Elbow extension, active-assisted  Hold your left / right arm at your side, and straighten your elbow (extension) as much as you can using your left / right arm muscles. Use your other hand to straighten the left / right elbow farther. To do this, gently push down on your forearm until you feel a gentle stretch on the inside of your elbow. Hold this position for __________ seconds. Slowly return to the starting position. Repeat __________ times. Complete this exercise __________ times a day. Active-assisted forearm rotation, supination This is  an exercise in which you turn (rotate) your forearm palm up (supination). Sit with your left / right elbow bent in a 90-degree angle (right angle) with your forearm resting on a table. Keeping your upper body and  shoulder still, rotate your forearm so your palm faces upward. Use your other hand to help rotate your forearm further until you feel a gentle to moderate stretch. Hold this position for __________ seconds. Slowly release the stretch and return to the starting position. Repeat __________ times. Complete this exercise __________ times a day. Active-assisted forearm rotation, pronation This is an exercise in which you turn (rotate) your forearm palm down (pronation). Sit with your left / right elbow bent in a 90-degree angle (right angle) with your forearm resting on a table. Keeping your upper body and shoulder still, rotate your forearm so your palm faces the tabletop. Use your other hand to help rotate your forearm further until you feel a gentle to moderate stretch. Hold this position for __________ seconds. Slowly release the stretch and return to the starting position. Repeat __________ times. Complete this exercise __________ times a day. Passive elbow flexion, supine Lie on your back (supine position). Extend your left / right arm up in the air, bracing it with your other hand. Let your left / right hand slowly lower toward your shoulder (passive flexion), while your elbow stays pointed toward the ceiling. You should feel a gentle stretch along the back of your upper arm and elbow. If instructed by your health care provider, you may increase the intensity of your stretch by adding a small wrist weight or hand weight. Hold this position for __________ seconds. Slowly return to the starting position. Repeat __________ times. Complete this exercise __________ times a day. Passive elbow extension, supine  Lie on your back (supine position). Make sure that you are in a comfortable position that lets you relax your arm muscles. Place a folded towel under your left / right upper arm so your elbow and shoulder are at the same height. Straighten your left / right arm so your elbow does not rest  on the bed or towel. Let the weight of your hand stretch your elbow (passive extension). Keep your arm and chest muscles relaxed. You should feel a stretch on the inside of your elbow. If told by your health care provider, you may increase the intensity of your stretch by adding a small wrist weight or hand weight. Hold this position for __________ seconds. Slowly release the stretch. Repeat __________ times. Complete this exercise __________ times a day. Strengthening exercises These exercises build strength and endurance in your elbow and forearm. Endurance is the ability to use your muscles for a long time, even after they get tired. Elbow flexion, isometric  Stand or sit up straight. Bend your left / right elbow in a 90-degree angle (right angle), and keep your forearm at the height of your waist. Your thumb should be pointed toward the ceiling (neutral forearm). Place your other hand on top of your left / right forearm. Gently push down while you resist with your left / right arm (isometric flexion). Push as hard as you can with both arms without causing any pain or movement at your left / right elbow. Hold this position for __________ seconds. Slowly release the tension in both arms. Let your muscles relax completely before you repeat the exercise. Repeat __________ times. Complete this exercise __________ times a day. Elbow extension, isometric  Stand or sit up straight. Place  your left / right arm so your palm faces your abdomen and is at the height of your waist. Place your other hand on the underside of your left / right forearm. Gently push up while you resist with your left / right arm (isometric extension). Push as hard as you can with both arms without causing any pain or movement at your left / right elbow. Hold this position for __________ seconds. Slowly release the tension in both arms. Let your muscles relax completely before you repeat the exercise. Repeat __________ times.  Complete this exercise __________ times a day. Elbow flexion with forearm palm up  Sit on a firm chair without armrests, or stand up. Place your left / right arm at your side with your elbow straight and your palm facing forward. Holding a __________weight or gripping a rubber exercise band or tubing, bend your elbow to bring your hand toward your shoulder (flexion). Hold this position for __________ seconds. Slowly return to the starting position. Repeat __________ times. Complete this exercise __________ times a day. Elbow extension, active  Sit on a firm chair without armrests, or stand up. Hold a rubber exercise band or tubing in both hands. Keeping your upper arms at your sides, bring both hands up to your left / right shoulder. Keep your left / right hand just below your other hand. Straighten your left / right elbow (extension) while keeping your other arm still. Hold this position for __________ seconds. Control the resistance of the band or tubing as you return to the starting position. Repeat __________ times. Complete this exercise __________ times a day. Forearm rotation, supination  Sit with your left / right forearm supported on a table. Your elbow should be at waist height and bent at a 90-degree angle (right angle). Gently grasp a lightweight hammer. Rest your hand over the edge of the table with your palm facing down. Without moving your left / right elbow, slowly rotate your forearm to turn your palm up toward the ceiling (supination). Hold this position for __________ seconds. Slowly return to the starting position. Repeat __________ times. Complete this exercise __________ times a day. Forearm rotation, pronation  Sit with your left / right forearm supported on a table. Keep your elbow below shoulder height. Gently grasp a lightweight hammer. Rest your hand over the edge of the table with your palm facing up. Without moving your left / right elbow, slowly rotate  your forearm to turn your palm down toward the floor (pronation). Hold this position for __________seconds. Slowly return to the starting position. Repeat __________ times. Complete this exercise __________ times a day. This information is not intended to replace advice given to you by your health care provider. Make sure you discuss any questions you have with your health care provider.  Knee Exercises Ask your health care provider which exercises are safe for you. Do exercises exactly as told by your health care provider and adjust them as directed. It is normal to feel mild stretching, pulling, tightness, or discomfort as you do these exercises. Stop right away if you feel sudden pain or your pain gets worse. Do not begin these exercises until told by your health care provider. Stretching and range-of-motion exercises These exercises warm up your muscles and joints and improve the movement and flexibility of your knee. These exercises also help to relieve pain and swelling. Knee extension, prone  Lie on your abdomen (prone position) on a bed. Place your left / right knee just beyond the  edge of the surface so your knee is not on the bed. You can put a towel under your left / right thigh just above your kneecap for comfort. Relax your leg muscles and allow gravity to straighten your knee (extension). You should feel a stretch behind your left / right knee. Hold this position for __________ seconds. Scoot up so your knee is supported between repetitions. Repeat __________ times. Complete this exercise __________ times a day. Knee flexion, active  Lie on your back with both legs straight. If this causes back discomfort, bend your left / right knee so your foot is flat on the floor. Slowly slide your left / right heel back toward your buttocks. Stop when you feel a gentle stretch in the front of your knee or thigh (flexion). Hold this position for __________ seconds. Slowly slide your left /  right heel back to the starting position. Repeat __________ times. Complete this exercise __________ times a day. Quadriceps stretch, prone  Lie on your abdomen on a firm surface, such as a bed or padded floor. Bend your left / right knee and hold your ankle. If you cannot reach your ankle or pant leg, loop a belt around your foot and grab the belt instead. Gently pull your heel toward your buttocks. Your knee should not slide out to the side. You should feel a stretch in the front of your thigh and knee (quadriceps). Hold this position for __________ seconds. Repeat __________ times. Complete this exercise __________ times a day. Hamstring, supine  Lie on your back (supine position). Loop a belt or towel over the ball of your left / right foot. The ball of your foot is on the walking surface, right under your toes. Straighten your left / right knee and slowly pull on the belt to raise your leg until you feel a gentle stretch behind your knee (hamstring). Do not let your knee bend while you do this. Keep your other leg flat on the floor. Hold this position for __________ seconds. Repeat __________ times. Complete this exercise __________ times a day. Strengthening exercises These exercises build strength and endurance in your knee. Endurance is the ability to use your muscles for a long time, even after they get tired. Quadriceps, isometric This exercise strengthens the muscles in front of your thigh (quadriceps) without moving your knee joint (isometric). Lie on your back with your left / right leg extended and your other knee bent. Put a rolled towel or small pillow under your knee if told by your health care provider. Slowly tense the muscles in the front of your left / right thigh. You should see your kneecap slide up toward your hip or see increased dimpling just above the knee. This motion will push the back of the knee toward the floor. For __________ seconds, hold the muscle as tight  as you can without increasing your pain. Relax the muscles slowly and completely. Repeat __________ times. Complete this exercise __________ times a day. Straight leg raises This exercise strengthens the muscles in front of your thigh (quadriceps) and the muscles that move your hips (hip flexors). Lie on your back with your left / right leg extended and your other knee bent. Tense the muscles in the front of your left / right thigh. You should see your kneecap slide up or see increased dimpling just above the knee. Your thigh may even shake a bit. Keep these muscles tight as you raise your leg 4-6 inches (10-15 cm) off the floor.  Do not let your knee bend. Hold this position for __________ seconds. Keep these muscles tense as you lower your leg. Relax your muscles slowly and completely after each repetition. Repeat __________ times. Complete this exercise __________ times a day. Hamstring, isometric  Lie on your back on a firm surface. Bend your left / right knee about __________ degrees. Dig your left / right heel into the surface as if you are trying to pull it toward your buttocks. Tighten the muscles in the back of your thighs (hamstring) to "dig" as hard as you can without increasing any pain. Hold this position for __________ seconds. Release the tension gradually and allow your muscles to relax completely for __________ seconds after each repetition. Repeat __________ times. Complete this exercise __________ times a day. Hamstring curls If told by your health care provider, do this exercise while wearing ankle weights. Begin with __________lb / kg weights. Then increase the weight by 1 lb (0.5 kg) increments. Do not wear ankle weights that are more than __________lb / kg. Lie on your abdomen with your legs straight. Bend your left / right knee as far as you can without feeling pain. Keep your hips flat against the floor. Hold this position for __________ seconds. Slowly lower your leg  to the starting position. Repeat __________ times. Complete this exercise __________ times a day. Squats This exercise strengthens the muscles in front of your thigh and knee (quadriceps). Stand in front of a table, with your feet and knees pointing straight ahead. You may rest your hands on the table for balance but not for support. Slowly bend your knees and lower your hips like you are going to sit in a chair. Keep your weight over your heels, not over your toes. Keep your lower legs upright so they are parallel with the table legs. Do not let your hips go lower than your knees. Do not bend lower than told by your health care provider. If your knee pain increases, do not bend as low. Hold the squat position for __________ seconds. Slowly push with your legs to return to standing. Do not use your hands to pull yourself to standing. Repeat __________ times. Complete this exercise __________ times a day. Wall slides This exercise strengthens the muscles in front of your thigh and knee (quadriceps). Lean your back against a smooth wall or door, and walk your feet out 18-24 inches (46-61 cm) from it. Place your feet hip-width apart. Slowly slide down the wall or door until your knees bend __________ degrees. Keep your knees over your heels, not over your toes. Keep your knees in line with your hips. Hold this position for __________ seconds. Repeat __________ times. Complete this exercise __________ times a day. Straight leg raises, side-lying This exercise strengthens the muscles that rotate the leg at the hip and move it away from your body (hip abductors). Lie on your side with your left / right leg in the top position. Lie so your head, shoulder, knee, and hip line up. You may bend your bottom knee to help you keep your balance. Roll your hips slightly forward so your hips are stacked directly over each other and your left / right knee is facing forward. Leading with your heel, lift your  top leg 4-6 inches (10-15 cm). You should feel the muscles in your outer hip lifting. Do not let your foot drift forward. Do not let your knee roll toward the ceiling. Hold this position for __________ seconds. Slowly return your  leg to the starting position. Let your muscles relax completely after each repetition. Repeat __________ times. Complete this exercise __________ times a day. Straight leg raises, prone This exercise stretches the muscles that move your hips away from the front of the pelvis (hip extensors). Lie on your abdomen on a firm surface. You can put a pillow under your hips if that is more comfortable. Tense the muscles in your buttocks and lift your left / right leg about 4-6 inches (10-15 cm). Keep your knee straight as you lift your leg. Hold this position for __________ seconds. Slowly lower your leg to the starting position. Let your leg relax completely after each repetition. Repeat __________ times. Complete this exercise __________ times a day. This information is not intended to replace advice given to you by your health care provider. Make sure you discuss any questions you have with your health care provider. Document Revised: 05/21/2021 Document Reviewed: 05/21/2021 Elsevier Patient Education  2022 [place] are taking a medication(s) that can suppress your immune system.  The following immunizations are recommended: Flu annually Covid-19  Td/Tdap (tetanus, diphtheria, pertussis) every 10 years Pneumonia (Prevnar 15 then Pneumovax 23 at least 1 year apart.  Alternatively, can take Prevnar 20 without needing additional dose) Shingrix: 2 doses from 4 weeks to 6 months apart  Please check with your PCP to make sure you are up to date.    Heart Disease Prevention   Your inflammatory disease increases your risk of heart disease which includes heart attack, stroke, atrial fibrillation (irregular heartbeats), high blood pressure, heart  failure and atherosclerosis (plaque in the arteries).  It is important to reduce your risk by:   Keep blood pressure, cholesterol, and blood sugar at healthy levels   Smoking Cessation   Maintain a healthy weight  BMI 20-25   Eat a healthy diet  Plenty of fresh fruit, vegetables, and whole grains  Limit saturated fats, foods high in sodium, and added sugars  DASH and Mediterranean diet   Increase physical activity  Recommend moderate physically activity for 150 minutes per week/ 30 minutes a day for five days a week These can be broken up into three separate ten-minute sessions during the day.   Reduce Stress  Meditation, slow breathing exercises, yoga, coloring books  Dental visits twice a year

## 2021-10-30 LAB — CBC WITH DIFFERENTIAL/PLATELET
Absolute Monocytes: 544 cells/uL (ref 200–950)
Basophils Absolute: 27 cells/uL (ref 0–200)
Basophils Relative: 0.4 %
Eosinophils Absolute: 143 cells/uL (ref 15–500)
Eosinophils Relative: 2.1 %
HCT: 39.1 % (ref 35.0–45.0)
Hemoglobin: 13.4 g/dL (ref 11.7–15.5)
Lymphs Abs: 2829 cells/uL (ref 850–3900)
MCH: 30.6 pg (ref 27.0–33.0)
MCHC: 34.3 g/dL (ref 32.0–36.0)
MCV: 89.3 fL (ref 80.0–100.0)
MPV: 9.7 fL (ref 7.5–12.5)
Monocytes Relative: 8 %
Neutro Abs: 3257 cells/uL (ref 1500–7800)
Neutrophils Relative %: 47.9 %
Platelets: 398 10*3/uL (ref 140–400)
RBC: 4.38 10*6/uL (ref 3.80–5.10)
RDW: 12.7 % (ref 11.0–15.0)
Total Lymphocyte: 41.6 %
WBC: 6.8 10*3/uL (ref 3.8–10.8)

## 2021-10-30 LAB — COMPLETE METABOLIC PANEL WITH GFR
AG Ratio: 1.4 (calc) (ref 1.0–2.5)
ALT: 18 U/L (ref 6–29)
AST: 13 U/L (ref 10–30)
Albumin: 4.3 g/dL (ref 3.6–5.1)
Alkaline phosphatase (APISO): 52 U/L (ref 31–125)
BUN: 9 mg/dL (ref 7–25)
CO2: 30 mmol/L (ref 20–32)
Calcium: 9.8 mg/dL (ref 8.6–10.2)
Chloride: 101 mmol/L (ref 98–110)
Creat: 0.82 mg/dL (ref 0.50–0.99)
Globulin: 3 g/dL (calc) (ref 1.9–3.7)
Glucose, Bld: 90 mg/dL (ref 65–99)
Potassium: 3.9 mmol/L (ref 3.5–5.3)
Sodium: 140 mmol/L (ref 135–146)
Total Bilirubin: 0.2 mg/dL (ref 0.2–1.2)
Total Protein: 7.3 g/dL (ref 6.1–8.1)
eGFR: 93 mL/min/{1.73_m2} (ref >=60)

## 2021-10-30 LAB — CYCLIC CITRUL PEPTIDE ANTIBODY, IGG: Cyclic Citrullin Peptide Ab: <16 UNITS

## 2021-10-30 LAB — RHEUMATOID FACTOR: Rheumatoid fact SerPl-aCnc: <14 IU/mL (ref <14)

## 2021-10-30 LAB — SEDIMENTATION RATE: Sed Rate: 22 mm/h — ABNORMAL HIGH (ref 0–20)

## 2021-10-30 NOTE — Progress Notes (Signed)
Sed rate is mildly elevated.  Antibodies against rheumatoid arthritis were negative.  CBC and CMP are normal.  No change in treatment advised.

## 2021-11-09 ENCOUNTER — Other Ambulatory Visit: Payer: Self-pay | Admitting: Physician Assistant

## 2021-11-09 DIAGNOSIS — M0579 Rheumatoid arthritis with rheumatoid factor of multiple sites without organ or systems involvement: Secondary | ICD-10-CM

## 2021-11-11 NOTE — Telephone Encounter (Signed)
Next Visit: 01/27/2022  Last Visit: 10/29/2021  Labs: 10/29/2021 CBC and CMP are normal  Eye exam: 02/28/2021     Current Dose per office note 10/29/2021: Plaquenil 200 mg 1 tablet twice daily Monday through Friday only  DX: Seropositive rheumatoid arthritis of multiple sites   Last Fill: 08/07/2021  Okay to refill Plaquenil?

## 2021-11-13 ENCOUNTER — Telehealth: Payer: Self-pay | Admitting: Rheumatology

## 2021-11-13 NOTE — Telephone Encounter (Signed)
Patient advised her FMLA paperwork is ready for pick up. Patient expressed understanding.

## 2021-11-13 NOTE — Telephone Encounter (Signed)
Patient called the office stating she left some FMLA paperwork here on 2/7 at her last appointment. Patient requests a call back updating her on the status of it. Patient would like to know where its at since it is due in 15 days.

## 2021-12-25 DIAGNOSIS — E1169 Type 2 diabetes mellitus with other specified complication: Secondary | ICD-10-CM | POA: Diagnosis not present

## 2021-12-25 DIAGNOSIS — M069 Rheumatoid arthritis, unspecified: Secondary | ICD-10-CM | POA: Diagnosis not present

## 2021-12-25 DIAGNOSIS — Z0000 Encounter for general adult medical examination without abnormal findings: Secondary | ICD-10-CM | POA: Diagnosis not present

## 2021-12-25 DIAGNOSIS — E559 Vitamin D deficiency, unspecified: Secondary | ICD-10-CM | POA: Diagnosis not present

## 2021-12-25 DIAGNOSIS — I10 Essential (primary) hypertension: Secondary | ICD-10-CM | POA: Diagnosis not present

## 2021-12-26 ENCOUNTER — Other Ambulatory Visit: Payer: Self-pay | Admitting: Internal Medicine

## 2021-12-26 DIAGNOSIS — E041 Nontoxic single thyroid nodule: Secondary | ICD-10-CM

## 2022-01-08 ENCOUNTER — Ambulatory Visit
Admission: RE | Admit: 2022-01-08 | Discharge: 2022-01-08 | Disposition: A | Discharge status: Home / Self Care | Payer: BC Managed Care – PPO | Source: Ambulatory Visit | Attending: Internal Medicine | Admitting: Internal Medicine

## 2022-01-08 DIAGNOSIS — E041 Nontoxic single thyroid nodule: Secondary | ICD-10-CM | POA: Diagnosis not present

## 2022-01-10 ENCOUNTER — Other Ambulatory Visit: Payer: Self-pay | Admitting: Internal Medicine

## 2022-01-10 DIAGNOSIS — E041 Nontoxic single thyroid nodule: Secondary | ICD-10-CM

## 2022-01-13 NOTE — Progress Notes (Signed)
? ?Office Visit Note ? ?Patient: Marie [name]             ?Date of Birth: Jun 16, 1981           ?MRN: [id number]             ?PCP: Wenda Low, MD ?Referring: Wenda Low, MD ?Visit Date: 01/27/2022 ?Occupation: '@GUAROCC'$ @ ? ?Subjective:  ?Medication monitoring ? ?History of Present Illness: Marie [name] is a 41 y.o. female with history of seropositive rheumatoid arthritis.  She is taking plaquenil 200 mg 1 tablet by mouth twice daily Monday through Friday.  She is tolerating Plaquenil without any side effects and has not missed any doses recently.  Patient reports that her joint pain and stiffness has improved slightly since her last office visit in February 2023 since she has scaled back on her hours at work.  She continues to experience intermittent pain in her hands which she attributes to overuse activities.  She also has occasional pain and inflammation in her ankle joints.  She has been taking Tylenol as needed for symptomatic relief.  She is currently having increased pain and stiffness in her neck for the past 3 days.  She denies any muscle spasms.  She denies any symptoms of radiculopathy. ?Patient reports that she will be undergoing a thyroid biopsy tomorrow. ? ? ?Activities of Daily Living:  ?Patient reports morning stiffness for 0 minutes.   ?Patient Denies nocturnal pain.  ?Difficulty dressing/grooming: Denies ?Difficulty climbing stairs: Denies ?Difficulty getting out of chair: Denies ?Difficulty using hands for taps, buttons, cutlery, and/or writing: Denies ? ?Review of Systems  ?Constitutional:  Negative for fatigue.  ?HENT:  Negative for mouth sores, mouth dryness and nose dryness.   ?Eyes:  Positive for dryness. Negative for pain and itching.  ?Respiratory:  Negative for shortness of breath and difficulty breathing.   ?Cardiovascular:  Negative for chest pain and palpitations.  ?Gastrointestinal:  Negative for blood in stool, constipation and diarrhea.  ?Endocrine: Negative for increased  urination.  ?Genitourinary:  Negative for difficulty urinating.  ?Musculoskeletal:  Positive for joint pain, joint pain, joint swelling, myalgias, morning stiffness, muscle tenderness and myalgias.  ?Skin:  Negative for color change, rash and redness.  ?Allergic/Immunologic: Negative for susceptible to infections.  ?Neurological:  Negative for dizziness, numbness, headaches, memory loss and weakness.  ?Hematological:  Positive for bruising/bleeding tendency.  ?Psychiatric/Behavioral:  Negative for confusion.   ? ?PMFS History:  ?Patient Active Problem List  ? Diagnosis Date Noted  ? Seropositive rheumatoid arthritis of multiple sites ([place]) 11/16/2016  ? History of goiter 11/16/2016  ? Family history of sarcoidosis 11/16/2016  ? History of gastroesophageal reflux (GERD) 11/03/2016  ? Abnormal serum angiotensin-converting enzyme level,has had positive rheumatoid factor, and anti-DNAse B in the past.   11/03/2016  ? High serum angiotensin converting enzyme (ACE) 11/03/2016  ? Goiter 08/05/2016  ? Gastroesophageal reflux disease without esophagitis 08/05/2016  ?  ?Past Medical History:  ?Diagnosis Date  ? Enlarged thyroid   ? no current med.  ? GERD (gastroesophageal reflux disease)   ? Hypertension   ? states under control with meds., has been on med. since 03/2017  ? Macromastia 06/2017  ? Rheumatoid arthritis ([place])   ? Sleep apnea   ? no CPAP use but did bring CPAP machine with her  ? Vertigo   ?  ?Family History  ?Problem Relation Age of Onset  ? Hypertension Mother   ? Arthritis Mother   ? Sarcoidosis Mother   ?  Diabetes Other   ? Diabetes Maternal Grandmother   ? Congestive Heart Failure Maternal Grandmother   ? Cancer Maternal Grandfather   ? ?Past Surgical History:  ?Procedure Laterality Date  ? BREAST REDUCTION SURGERY Bilateral 07/06/2017  ? Procedure: MAMMARY REDUCTION  (BREAST);  Surgeon: Cristine Polio, MD;  Location: [facility];  Service: Plastics;  Laterality: Bilateral;  ?  CHROMOPERTUBATION  05/30/2011  ? HYSTEROSCOPY DIAGNOSTIC  05/30/2011  ? LAPAROSCOPIC LYSIS OF ADHESIONS  05/30/2011  ? LAPAROSCOPIC UNILATERAL SALPINGECTOMY Right 09/19/2010  ? ?Social History  ? ?Social History Narrative  ? Not on file  ? ?Immunization History  ?Administered Date(s) Administered  ? PFIZER(Purple Top)SARS-COV-2 Vaccination 11/19/2019, 12/10/2019, 08/10/2020  ?  ? ?Objective: ?Vital [name]: BP 138/90 (BP Location: Left Arm, Patient Position: Sitting, Cuff Size: Large)   Pulse 79   Ht '5\' 2"'$  (1.575 m)   Wt 241 lb (109.3 kg)   BMI 44.08 kg/m?   ? ?Physical Exam ?Vitals and nursing note reviewed.  ?Constitutional:   ?   Appearance: She is well-developed.  ?HENT:  ?   Head: Normocephalic and atraumatic.  ?Eyes:  ?   Conjunctiva/sclera: Conjunctivae normal.  ?Cardiovascular:  ?   Rate and Rhythm: Normal rate and regular rhythm.  ?   Heart sounds: Normal heart sounds.  ?Pulmonary:  ?   Effort: Pulmonary effort is normal.  ?   Breath sounds: Normal breath sounds.  ?Abdominal:  ?   General: Bowel sounds are normal.  ?   Palpations: Abdomen is soft.  ?Musculoskeletal:  ?   Cervical back: Normal range of motion.  ?Skin: ?   General: Skin is warm and dry.  ?   Capillary Refill: Capillary refill takes less than 2 seconds.  ?Neurological:  ?   Mental Status: She is alert and oriented to person, place, and time.  ?Psychiatric:     ?   Behavior: Behavior normal.  ?  ? ?Musculoskeletal Exam: C-spine has slightly limited range of motion without rotation especially to the right.  Shoulder joints, elbow joints, wrist joints, MCPs, PIPs, DIPs have good range of motion with no synovitis.  Complete fist formation bilaterally.  Hip joints have good range of motion with no groin pain.  Knee joints have good range of motion with no warmth or effusion.  Ankle joints have good range of motion with no tenderness or joint swelling. ? ?CDAI Exam: ?CDAI Score: 0.6  ?Patient Global: 3 mm; Provider Global: 3 mm ?Swollen: 0 ;  Tender: 0  ?Joint Exam 01/27/2022  ? ?No joint exam has been documented for this visit  ? ?There is currently no information documented on the homunculus. Go to the Rheumatology activity and complete the homunculus joint exam. ? ?Investigation: ?No additional findings. ? ?Imaging: ?US THYROID ? ?Result Date: 01/08/2022 ?CLINICAL DATA:  Prior ultrasound follow-up. EXAM: THYROID ULTRASOUND TECHNIQUE: Ultrasound examination of the thyroid gland and adjacent soft tissues was performed. COMPARISON:  April 2021 FINDINGS: Parenchymal Echotexture: Moderately heterogenous Isthmus: 1.0 cm Right lobe: 6.4 x 2.9 x 3.0 cm Left lobe: 6.8 x 3.0 x 3.2 cm _________________________________________________________ Estimated total number of nodules >/= 1 cm: 8 Number of spongiform nodules >/=  2 cm not described below (TR1): 0 Number of mixed cystic and solid nodules >/= 1.5 cm not described below (TR2): 0 _________________________________________________________ Nodule labeled 1 is a solid isoechoic TR 3 nodule in the superior isthmus that measures 2.8 x 2.7 x 1.3 cm. It previously measured up to 2.4 cm  in 2021. **Given size (>/= 2.5 cm) and appearance, fine needle aspiration of this mildly suspicious nodule should be considered based on TI-RADS criteria. Nodule labeled 2 is a solid isoechoic TR 3 nodule in the inferior isthmus that measures 2.3 x 2.1 x 1.6 cm, previously measuring up to 2.4 cm. It remains similar in size and morphology. *Given size (>/= 1.5 - 2.4 cm) and appearance, a follow-up ultrasound in 1 year should be considered based on TI-RADS criteria. Nodule labeled 3 is a small subcentimeter solid hypoechoic TR 4 nodule in the superior right thyroid lobe. Given size (<0.9 cm) and appearance, this nodule does NOT meet TI-RADS criteria for biopsy or dedicated follow-up. Nodule labeled 4 and 5 appear as a small spongiform nodules in the mid right thyroid lobe measuring up to 1 cm in size. These nodules do NOT meet TI-RADS  criteria for biopsy or dedicated follow-up. Nodule labeled 6 is a solid hypoechoic TR 4 nodule in the inferior right thyroid lobe that measures 1.4 x 1.3 x 1.1 cm, previously measuring up to 1.1 cm. It appears slightly increa

## 2022-01-27 ENCOUNTER — Encounter: Payer: Self-pay | Admitting: Physician Assistant

## 2022-01-27 ENCOUNTER — Ambulatory Visit: Payer: BC Managed Care – PPO | Admitting: Physician Assistant

## 2022-01-27 VITALS — BP 138/90 | HR 79 | Ht 62.0 in | Wt 241.0 lb

## 2022-01-27 DIAGNOSIS — M79671 Pain in right foot: Secondary | ICD-10-CM

## 2022-01-27 DIAGNOSIS — G8929 Other chronic pain: Secondary | ICD-10-CM

## 2022-01-27 DIAGNOSIS — M0579 Rheumatoid arthritis with rheumatoid factor of multiple sites without organ or systems involvement: Secondary | ICD-10-CM | POA: Diagnosis not present

## 2022-01-27 DIAGNOSIS — Z6841 Body Mass Index (BMI) 40.0 and over, adult: Secondary | ICD-10-CM

## 2022-01-27 DIAGNOSIS — Z8639 Personal history of other endocrine, nutritional and metabolic disease: Secondary | ICD-10-CM

## 2022-01-27 DIAGNOSIS — M79641 Pain in right hand: Secondary | ICD-10-CM

## 2022-01-27 DIAGNOSIS — M7711 Lateral epicondylitis, right elbow: Secondary | ICD-10-CM

## 2022-01-27 DIAGNOSIS — Z8719 Personal history of other diseases of the digestive system: Secondary | ICD-10-CM

## 2022-01-27 DIAGNOSIS — Z832 Family history of diseases of the blood and blood-forming organs and certain disorders involving the immune mechanism: Secondary | ICD-10-CM

## 2022-01-27 DIAGNOSIS — M25561 Pain in right knee: Secondary | ICD-10-CM

## 2022-01-27 DIAGNOSIS — Z79899 Other long term (current) drug therapy: Secondary | ICD-10-CM

## 2022-01-27 DIAGNOSIS — M25562 Pain in left knee: Secondary | ICD-10-CM

## 2022-01-27 DIAGNOSIS — M7712 Lateral epicondylitis, left elbow: Secondary | ICD-10-CM

## 2022-01-27 DIAGNOSIS — M79642 Pain in left hand: Secondary | ICD-10-CM

## 2022-01-27 DIAGNOSIS — M79672 Pain in left foot: Secondary | ICD-10-CM

## 2022-01-27 NOTE — Patient Instructions (Signed)
Standing Labs ?We placed an order today for your standing lab work.  ? ?Please have your standing labs drawn in July and every 5 months  ? ?If possible, please have your labs drawn 2 weeks prior to your appointment so that the provider can discuss your results at your appointment. ? ?Please note that you may see your imaging and lab results in [facility] before we have reviewed them. ?We may be awaiting multiple results to interpret others before contacting you. ?Please allow our office up to 72 hours to thoroughly review all of the results before contacting the office for clarification of your results. ? ?We have open lab daily: ?Monday through Thursday from 1:30-4:30 PM and Friday from 1:30-4:00 PM ?at the office of Dr. Bo Merino, [facility].   ?Please be advised, all patients with office appointments requiring lab work will take precedent over walk-in lab work.  ?If possible, please come for your lab work on Monday and Friday afternoons, as you may experience shorter wait times. ?The office is located at [address] [place], [place], [place] [address] ?No appointment is necessary.   ?Labs are drawn by [facility]. Please bring your co-pay at the time of your lab draw.  You may receive a bill from [facility] for your lab work. ? ?Please note if you are on Hydroxychloroquine and and an order has been placed for a Hydroxychloroquine level, you will need to have it drawn 4 hours or more after your last dose. ? ?If you wish to have your labs drawn at another location, please call the office 24 hours in advance to send orders. ? ?If you have any questions regarding directions or hours of operation,  ?please call [phone].   ?As a reminder, please drink plenty of water prior to coming for your lab work. Thanks! ? ?

## 2022-01-28 ENCOUNTER — Other Ambulatory Visit (HOSPITAL_COMMUNITY)
Admission: RE | Admit: 2022-01-28 | Discharge: 2022-01-28 | Disposition: A | Discharge status: Home / Self Care | Payer: BC Managed Care – PPO | Source: Ambulatory Visit | Attending: Radiology | Admitting: Radiology

## 2022-01-28 ENCOUNTER — Ambulatory Visit
Admission: RE | Admit: 2022-01-28 | Discharge: 2022-01-28 | Disposition: A | Discharge status: Home / Self Care | Payer: BC Managed Care – PPO | Source: Ambulatory Visit | Attending: Internal Medicine | Admitting: Internal Medicine

## 2022-01-28 DIAGNOSIS — D34 Benign neoplasm of thyroid gland: Secondary | ICD-10-CM | POA: Insufficient documentation

## 2022-01-28 DIAGNOSIS — E041 Nontoxic single thyroid nodule: Secondary | ICD-10-CM | POA: Diagnosis not present

## 2022-01-29 LAB — CYTOLOGY - NON PAP

## 2022-02-17 ENCOUNTER — Other Ambulatory Visit: Payer: Self-pay | Admitting: Physician Assistant

## 2022-02-17 DIAGNOSIS — M0579 Rheumatoid arthritis with rheumatoid factor of multiple sites without organ or systems involvement: Secondary | ICD-10-CM

## 2022-02-18 NOTE — Telephone Encounter (Signed)
Next Visit: 07/01/2022  Last Visit: 01/27/2022  Labs: 10/29/2021 CBC and CMP are normal  Eye exam: 02/28/2021 WNL  (Has appt scheduled for 03/04/2022)  Current Dose per office note 01/27/2022: Plaquenil 200 mg 1 tablet twice daily Monday through Friday only.   [id number] rheumatoid arthritis of multiple sites  Last Fill: 11/11/2021  Okay to refill Plaquenil?

## 2022-03-04 DIAGNOSIS — H5211 Myopia, right eye: Secondary | ICD-10-CM | POA: Diagnosis not present

## 2022-03-04 DIAGNOSIS — E119 Type 2 diabetes mellitus without complications: Secondary | ICD-10-CM | POA: Diagnosis not present

## 2022-03-04 DIAGNOSIS — M069 Rheumatoid arthritis, unspecified: Secondary | ICD-10-CM | POA: Diagnosis not present

## 2022-03-04 DIAGNOSIS — Z79899 Other long term (current) drug therapy: Secondary | ICD-10-CM | POA: Diagnosis not present

## 2022-04-29 ENCOUNTER — Other Ambulatory Visit: Payer: Self-pay | Admitting: Obstetrics and Gynecology

## 2022-04-29 DIAGNOSIS — Z1231 Encounter for screening mammogram for malignant neoplasm of breast: Secondary | ICD-10-CM

## 2022-05-14 DIAGNOSIS — M069 Rheumatoid arthritis, unspecified: Secondary | ICD-10-CM | POA: Diagnosis not present

## 2022-05-14 DIAGNOSIS — Z6841 Body Mass Index (BMI) 40.0 and over, adult: Secondary | ICD-10-CM | POA: Diagnosis not present

## 2022-05-14 DIAGNOSIS — G4733 Obstructive sleep apnea (adult) (pediatric): Secondary | ICD-10-CM | POA: Diagnosis not present

## 2022-05-14 DIAGNOSIS — Z7984 Long term (current) use of oral hypoglycemic drugs: Secondary | ICD-10-CM | POA: Diagnosis not present

## 2022-05-14 DIAGNOSIS — E1169 Type 2 diabetes mellitus with other specified complication: Secondary | ICD-10-CM | POA: Diagnosis not present

## 2022-05-29 ENCOUNTER — Other Ambulatory Visit: Payer: Self-pay | Admitting: Physician Assistant

## 2022-05-29 DIAGNOSIS — M0579 Rheumatoid arthritis with rheumatoid factor of multiple sites without organ or systems involvement: Secondary | ICD-10-CM

## 2022-05-29 NOTE — Telephone Encounter (Signed)
Next Visit: 07/01/2022   Last Visit: 01/27/2022   Labs: 10/29/2021 CBC and CMP are normal   Eye exam: 03/04/2022 WNL   Current Dose per office note 01/27/2022: Plaquenil 200 mg 1 tablet twice daily Monday through Friday only.    HL:[id number] rheumatoid arthritis of multiple sites   Last Fill: 02/18/2022   Okay to refill Plaquenil

## 2022-06-13 ENCOUNTER — Ambulatory Visit
Admission: RE | Admit: 2022-06-13 | Discharge: 2022-06-13 | Disposition: A | Discharge status: Home / Self Care | Payer: BC Managed Care – PPO | Source: Ambulatory Visit | Attending: Obstetrics and Gynecology | Admitting: Obstetrics and Gynecology

## 2022-06-13 DIAGNOSIS — Z1231 Encounter for screening mammogram for malignant neoplasm of breast: Secondary | ICD-10-CM

## 2022-06-17 ENCOUNTER — Other Ambulatory Visit: Payer: Self-pay | Admitting: Obstetrics and Gynecology

## 2022-06-17 DIAGNOSIS — R928 Other abnormal and inconclusive findings on diagnostic imaging of breast: Secondary | ICD-10-CM

## 2022-06-17 NOTE — Progress Notes (Signed)
Office Visit Note  Patient: Marie [name]             Date of Birth: 12-Nov-1980           MRN: [id number]             PCP: Wenda Low, MD Referring: Wenda Low, MD Visit Date: 07/01/2022 Occupation: '@GUAROCC'$ @  Subjective:  Medication management  History of Present Illness: Marie [name] is a 41 y.o. female history of seropositive rheumatoid arthritis.  She states she has been doing well on hydroxychloroquine twice a day Monday to Friday.  She denies any increased joint pain or joint swelling.  She has off-and-on discomfort in her elbows lateral epicondylitis.  She denies any discomfort in her cervical spine or her knee joints currently.  Activities of Daily Living:  Patient reports morning stiffness for 0 minutes.   Patient Denies nocturnal pain.  Difficulty dressing/grooming: Denies Difficulty climbing stairs: Denies Difficulty getting out of chair: Denies Difficulty using hands for taps, buttons, cutlery, and/or writing: Denies  Review of Systems  Constitutional:  Positive for fatigue.  HENT:  Negative for mouth sores and mouth dryness.   Eyes:  Positive for dryness.  Respiratory:  Negative for shortness of breath.   Cardiovascular:  Negative for chest pain and palpitations.  Gastrointestinal:  Negative for blood in stool, constipation and diarrhea.  Endocrine: Negative for increased urination.  Genitourinary:  Negative for involuntary urination.  Musculoskeletal:  Negative for joint pain, gait problem, joint pain, joint swelling, myalgias, muscle weakness, morning stiffness, muscle tenderness and myalgias.  Skin:  Negative for color change, rash, hair loss and sensitivity to sunlight.  Allergic/Immunologic: Negative for susceptible to infections.  Neurological:  Negative for dizziness and headaches.  Hematological:  Negative for swollen glands.  Psychiatric/Behavioral:  Negative for depressed mood and sleep disturbance. The patient is not nervous/anxious.      PMFS History:  Patient Active Problem List   Diagnosis Date Noted   Seropositive rheumatoid arthritis of multiple sites ([place]) 11/16/2016   History of goiter 11/16/2016   Family history of sarcoidosis 11/16/2016   History of gastroesophageal reflux (GERD) 11/03/2016   Abnormal serum angiotensin-converting enzyme level,has had positive rheumatoid factor, and anti-DNAse B in the past.   11/03/2016   High serum angiotensin converting enzyme (ACE) 11/03/2016   Goiter 08/05/2016   Gastroesophageal reflux disease without esophagitis 08/05/2016    Past Medical History:  Diagnosis Date   Enlarged thyroid    no current med.   GERD (gastroesophageal reflux disease)    Hypertension    states under control with meds., has been on med. since 03/2017   Macromastia 06/2017   Rheumatoid arthritis ([place])    Sleep apnea    no CPAP use but did bring CPAP machine with her   Vertigo     Family History  Problem Relation Age of Onset   Hypertension Mother    Arthritis Mother    Sarcoidosis Mother    Diabetes Other    Diabetes Maternal Grandmother    Congestive Heart Failure Maternal Grandmother    Cancer Maternal Grandfather    Past Surgical History:  Procedure Laterality Date   BREAST REDUCTION SURGERY Bilateral 07/06/2017   Procedure: MAMMARY REDUCTION  (BREAST);  Surgeon: Cristine Polio, MD;  Location: [facility];  Service: Plastics;  Laterality: Bilateral;   CHROMOPERTUBATION  05/30/2011   HYSTEROSCOPY DIAGNOSTIC  05/30/2011   LAPAROSCOPIC LYSIS OF ADHESIONS  05/30/2011   LAPAROSCOPIC UNILATERAL SALPINGECTOMY Right 09/19/2010  REDUCTION MAMMAPLASTY Bilateral 2018   Social History   Social History Narrative   Not on file   Immunization History  Administered Date(s) Administered   PFIZER(Purple Top)SARS-COV-2 Vaccination 11/19/2019, 12/10/2019, 08/10/2020     Objective: Vital [name]: BP (!) 151/99 (BP Location: Left Arm, Patient Position: Sitting, Cuff Size:  Normal)   Pulse 68   Resp 16   Ht '5\' 2"'$  (1.575 m)   Wt 234 lb 6.4 oz (106.3 kg)   BMI 42.87 kg/m    Physical Exam Vitals and nursing note reviewed.  Constitutional:      Appearance: She is well-developed.  HENT:     Head: Normocephalic and atraumatic.  Eyes:     Conjunctiva/sclera: Conjunctivae normal.  Cardiovascular:     Rate and Rhythm: Normal rate and regular rhythm.     Heart sounds: Normal heart sounds.  Pulmonary:     Effort: Pulmonary effort is normal.     Breath sounds: Normal breath sounds.  Abdominal:     General: Bowel sounds are normal.     Palpations: Abdomen is soft.  Musculoskeletal:     Cervical back: Normal range of motion.  Lymphadenopathy:     Cervical: No cervical adenopathy.  Skin:    General: Skin is warm and dry.     Capillary Refill: Capillary refill takes less than 2 seconds.  Neurological:     Mental Status: She is alert and oriented to person, place, and time.  Psychiatric:        Behavior: Behavior normal.      Musculoskeletal Exam: Apical splint was in good range of motion.  Shoulder joints, elbow joints, wrist joints, MCPs PIPs and DIPs with good range of motion with no synovitis.  Hip joints, knee joints with good range of motion.  No warmth or swelling was noted.  There was no tenderness over ankles or MTPs.  CDAI Exam: CDAI Score: -- Patient Global: 1 mm; Provider Global: 1 mm Swollen: --; Tender: -- Joint Exam 07/01/2022   No joint exam has been documented for this visit   There is currently no information documented on the homunculus. Go to the Rheumatology activity and complete the homunculus joint exam.  Investigation: No additional findings.  Imaging: MM 3D SCREEN BREAST BILATERAL  Addendum Date: 06/16/2022   ADDENDUM REPORT: 06/16/2022 15:49 ADDENDUM: Upon further evaluation, distortion of which appears more focal on the LEFT CC view is more conspicuous when compared to 2022. Although this more likely represents  reduction/surgical changes, further evaluation is recommended. Recommendation: LEFT diagnostic mammogram with possible LEFT breast ultrasound. BI-RADS category: 0: Incomplete. Need additional imaging evaluation and/or prior mammograms for comparison. Electronically Signed   By: Margarette Canada M.D.   On: 06/16/2022 15:49   Result Date: 06/16/2022 CLINICAL DATA:  Screening. History of bilateral breast reductions EXAM: DIGITAL SCREENING BILATERAL MAMMOGRAM WITH TOMOSYNTHESIS AND CAD TECHNIQUE: Bilateral screening digital craniocaudal and mediolateral oblique mammograms were obtained. Bilateral screening digital breast tomosynthesis was performed. The images were evaluated with computer-aided detection. COMPARISON:  Previous exam(s). ACR Breast Density Category b: There are scattered areas of fibroglandular density. FINDINGS: There are no findings suspicious for malignancy. Surgical appearing changes within both breasts are again noted and not significantly changed from prior study IMPRESSION: No mammographic evidence of malignancy. A result letter of this screening mammogram will be mailed directly to the patient. RECOMMENDATION: Screening mammogram in one year. (Code:SM-B-01Y) BI-RADS CATEGORY  2: Benign. Electronically Signed: By: Margarette Canada M.D. On: 06/16/2022 15:30  Recent Labs: Lab Results  Component Value Date   WBC 6.8 10/29/2021   HGB 13.4 10/29/2021   PLT 398 10/29/2021   NA 140 10/29/2021   K 3.9 10/29/2021   CL 101 10/29/2021   CO2 30 10/29/2021   GLUCOSE 90 10/29/2021   BUN 9 10/29/2021   CREATININE 0.82 10/29/2021   BILITOT 0.2 10/29/2021   ALKPHOS 72 04/16/2017   AST 13 10/29/2021   ALT 18 10/29/2021   PROT 7.3 10/29/2021   ALBUMIN 4.4 04/16/2017   CALCIUM 9.8 10/29/2021   GFRAA 127 12/26/2020    Speciality Comments: PLQ eye exam: 03/04/2022 WNL Dr. Gershon Crane. Follow up 1 year.  Procedures:  No procedures performed Allergies: Iodine, Red dye, Shellfish allergy, Latex, and Sulfa  antibiotics   Assessment / Plan:     Visit Diagnoses: Seropositive rheumatoid arthritis of multiple sites ([place]) - RF negative, anti-CCP positive, positive synovitis on ultrasound: Patient had no synovitis on examination.  She has been tolerating hydroxychloroquine without any side effects.  High risk medication use - Plaquenil 200 mg 1 tablet twice daily Monday through Friday only. PLQ eye exam: 03/04/2022.  Labs from October 29, 2021 CBC and CMP were normal.  Sed rate was elevated at 22.  Patient would like labs today.  Pain in both hands - Early osteoarthritic changes were noted in the x-rays.  Patient had no synovitis my examination.  Lateral epicondylitis of both elbows -she had no tenderness over lateral epicondyle region.  Chronic pain of both knees -she denies any discomfort in her knee joints.  No warmth swelling or effusion was noted.  X-rays of bilateral knee joints were unremarkable.    Pain in both feet -no synovitis was noted. BIiateral calcaneal spurs were noted.   BMI 40.0-44.9, adult (HCC)-weight loss diet and exercise was discussed.  History of gastroesophageal reflux (GERD)  History of goiter  Family history of sarcoidosis  Orders: Orders Placed This Encounter  Procedures   CBC with Differential/Platelet   COMPLETE METABOLIC PANEL WITH GFR   No orders of the defined types were placed in this encounter.    Follow-Up Instructions: Return in about 5 months (around 11/30/2022) for Rheumatoid arthritis.   Bo Merino, MD  Note - This record has been created using Editor, commissioning.  Chart creation errors have been sought, but may not always  have been located. Such creation errors do not reflect on  the standard of medical care.

## 2022-07-01 ENCOUNTER — Encounter: Payer: Self-pay | Admitting: Rheumatology

## 2022-07-01 ENCOUNTER — Ambulatory Visit: Payer: BC Managed Care – PPO | Attending: Rheumatology | Admitting: Rheumatology

## 2022-07-01 ENCOUNTER — Other Ambulatory Visit: Payer: Self-pay | Admitting: Obstetrics and Gynecology

## 2022-07-01 ENCOUNTER — Ambulatory Visit
Admission: RE | Admit: 2022-07-01 | Discharge: 2022-07-01 | Disposition: A | Discharge status: Home / Self Care | Payer: BC Managed Care – PPO | Source: Ambulatory Visit | Attending: Obstetrics and Gynecology | Admitting: Obstetrics and Gynecology

## 2022-07-01 VITALS — BP 151/99 | HR 68 | Resp 16 | Ht 62.0 in | Wt 234.4 lb

## 2022-07-01 DIAGNOSIS — Z8719 Personal history of other diseases of the digestive system: Secondary | ICD-10-CM

## 2022-07-01 DIAGNOSIS — M0579 Rheumatoid arthritis with rheumatoid factor of multiple sites without organ or systems involvement: Secondary | ICD-10-CM

## 2022-07-01 DIAGNOSIS — M79641 Pain in right hand: Secondary | ICD-10-CM | POA: Diagnosis not present

## 2022-07-01 DIAGNOSIS — R92322 Mammographic fibroglandular density, left breast: Secondary | ICD-10-CM | POA: Diagnosis not present

## 2022-07-01 DIAGNOSIS — M79672 Pain in left foot: Secondary | ICD-10-CM

## 2022-07-01 DIAGNOSIS — M25561 Pain in right knee: Secondary | ICD-10-CM

## 2022-07-01 DIAGNOSIS — M7711 Lateral epicondylitis, right elbow: Secondary | ICD-10-CM | POA: Diagnosis not present

## 2022-07-01 DIAGNOSIS — M25562 Pain in left knee: Secondary | ICD-10-CM

## 2022-07-01 DIAGNOSIS — N6489 Other specified disorders of breast: Secondary | ICD-10-CM | POA: Diagnosis not present

## 2022-07-01 DIAGNOSIS — M79642 Pain in left hand: Secondary | ICD-10-CM

## 2022-07-01 DIAGNOSIS — G8929 Other chronic pain: Secondary | ICD-10-CM

## 2022-07-01 DIAGNOSIS — R928 Other abnormal and inconclusive findings on diagnostic imaging of breast: Secondary | ICD-10-CM

## 2022-07-01 DIAGNOSIS — M7712 Lateral epicondylitis, left elbow: Secondary | ICD-10-CM

## 2022-07-01 DIAGNOSIS — Z6841 Body Mass Index (BMI) 40.0 and over, adult: Secondary | ICD-10-CM

## 2022-07-01 DIAGNOSIS — Z79899 Other long term (current) drug therapy: Secondary | ICD-10-CM

## 2022-07-01 DIAGNOSIS — Z8639 Personal history of other endocrine, nutritional and metabolic disease: Secondary | ICD-10-CM

## 2022-07-01 DIAGNOSIS — Z832 Family history of diseases of the blood and blood-forming organs and certain disorders involving the immune mechanism: Secondary | ICD-10-CM

## 2022-07-01 DIAGNOSIS — M79671 Pain in right foot: Secondary | ICD-10-CM

## 2022-07-01 NOTE — Patient Instructions (Signed)
Standing Labs We placed an order today for your standing lab work.   Please have your standing labs drawn in  March and every 5 months  Please have your labs drawn 2 weeks prior to your appointment so that the provider can discuss your lab results at your appointment.  Please note that you may see your imaging and lab results in [facility] before we have reviewed them. We will contact you once all results are reviewed. Please allow our office up to 72 hours to thoroughly review all of the results before contacting the office for clarification of your results.  Lab hours are: Monday through Thursday from 1:30 pm-4:30 pm and Friday from 1:30 pm- 4:00 pm  You may experience shorter wait times on Monday, Thursday or Friday afternoons,.   Effective July 21, 2022, new lab hours will be: Monday through Thursday from 8:00 am -12:30 pm and 1:00 pm-5:00 pm and Friday from 8:00 am-12:00 pm.  Please be advised, all patients with office appointments requiring lab work will take precedent over walk-in lab work.   Labs are drawn by [facility]. Please bring your co-pay at the time of your lab draw.  You may receive a bill from [facility] for your lab work.  Please note if you are on Hydroxychloroquine and and an order has been placed for a Hydroxychloroquine level, you will need to have it drawn 4 hours or more after your last dose.  If you wish to have your labs drawn at another location, please call the office 24 hours in advance so we can fax the orders.  The office is located at [address], [place], [place], [place] [address] No appointment is necessary.    If you have any questions regarding directions or hours of operation,  please call [phone].   As a reminder, please drink plenty of water prior to coming for your lab work. Thanks!   Vaccines You are taking a medication(s) that can suppress your immune system.  The following immunizations are recommended: Flu annually Covid-19  Td/Tdap  (tetanus, diphtheria, pertussis) every 10 years Pneumonia (Prevnar 15 then Pneumovax 23 at least 1 year apart.  Alternatively, can take Prevnar 20 without needing additional dose) Shingrix: 2 doses from 4 weeks to 6 months apart  Please check with your PCP to make sure you are up to date.

## 2022-07-02 LAB — CBC WITH DIFFERENTIAL/PLATELET
Absolute Monocytes: 506 cells/uL (ref 200–950)
Basophils Absolute: 31 cells/uL (ref 0–200)
Basophils Relative: 0.7 %
Eosinophils Absolute: 180 cells/uL (ref 15–500)
Eosinophils Relative: 4.1 %
HCT: 40.2 % (ref 35.0–45.0)
Hemoglobin: 14 g/dL (ref 11.7–15.5)
Lymphs Abs: 1720 cells/uL (ref 850–3900)
MCH: 30.8 pg (ref 27.0–33.0)
MCHC: 34.8 g/dL (ref 32.0–36.0)
MCV: 88.5 fL (ref 80.0–100.0)
MPV: 10 fL (ref 7.5–12.5)
Monocytes Relative: 11.5 %
Neutro Abs: 1962 cells/uL (ref 1500–7800)
Neutrophils Relative %: 44.6 %
Platelets: 291 10*3/uL (ref 140–400)
RBC: 4.54 10*6/uL (ref 3.80–5.10)
RDW: 13.1 % (ref 11.0–15.0)
Total Lymphocyte: 39.1 %
WBC: 4.4 10*3/uL (ref 3.8–10.8)

## 2022-07-02 LAB — COMPLETE METABOLIC PANEL WITH GFR
AG Ratio: 1.5 (calc) (ref 1.0–2.5)
ALT: 25 U/L (ref 6–29)
AST: 19 U/L (ref 10–30)
Albumin: 4.4 g/dL (ref 3.6–5.1)
Alkaline phosphatase (APISO): 54 U/L (ref 31–125)
BUN: 11 mg/dL (ref 7–25)
CO2: 32 mmol/L (ref 20–32)
Calcium: 9.8 mg/dL (ref 8.6–10.2)
Chloride: 99 mmol/L (ref 98–110)
Creat: 0.88 mg/dL (ref 0.50–0.99)
Globulin: 3 g/dL (calc) (ref 1.9–3.7)
Glucose, Bld: 82 mg/dL (ref 65–99)
Potassium: 3.6 mmol/L (ref 3.5–5.3)
Sodium: 141 mmol/L (ref 135–146)
Total Bilirubin: 0.3 mg/dL (ref 0.2–1.2)
Total Protein: 7.4 g/dL (ref 6.1–8.1)
eGFR: 85 mL/min/{1.73_m2} (ref >=60)

## 2022-07-02 NOTE — Progress Notes (Signed)
CBC and CMP are normal.

## 2022-07-18 ENCOUNTER — Ambulatory Visit
Admission: RE | Admit: 2022-07-18 | Discharge: 2022-07-18 | Disposition: A | Discharge status: Home / Self Care | Payer: BC Managed Care – PPO | Source: Ambulatory Visit | Attending: Obstetrics and Gynecology | Admitting: Obstetrics and Gynecology

## 2022-07-18 DIAGNOSIS — N6012 Diffuse cystic mastopathy of left breast: Secondary | ICD-10-CM | POA: Diagnosis not present

## 2022-07-18 DIAGNOSIS — R928 Other abnormal and inconclusive findings on diagnostic imaging of breast: Secondary | ICD-10-CM | POA: Diagnosis not present

## 2022-07-18 DIAGNOSIS — N6489 Other specified disorders of breast: Secondary | ICD-10-CM

## 2022-07-18 HISTORY — PX: BREAST BIOPSY: SHX20

## 2022-07-30 DIAGNOSIS — N926 Irregular menstruation, unspecified: Secondary | ICD-10-CM | POA: Diagnosis not present

## 2022-07-30 DIAGNOSIS — N898 Other specified noninflammatory disorders of vagina: Secondary | ICD-10-CM | POA: Diagnosis not present

## 2022-08-28 DIAGNOSIS — I10 Essential (primary) hypertension: Secondary | ICD-10-CM | POA: Diagnosis not present

## 2022-08-28 DIAGNOSIS — Z01419 Encounter for gynecological examination (general) (routine) without abnormal findings: Secondary | ICD-10-CM | POA: Diagnosis not present

## 2022-08-28 DIAGNOSIS — M069 Rheumatoid arthritis, unspecified: Secondary | ICD-10-CM | POA: Diagnosis not present

## 2022-08-28 DIAGNOSIS — Z23 Encounter for immunization: Secondary | ICD-10-CM | POA: Diagnosis not present

## 2022-08-28 DIAGNOSIS — E1169 Type 2 diabetes mellitus with other specified complication: Secondary | ICD-10-CM | POA: Diagnosis not present

## 2022-09-11 DIAGNOSIS — D259 Leiomyoma of uterus, unspecified: Secondary | ICD-10-CM | POA: Diagnosis not present

## 2022-09-17 IMAGING — US US THYROID
1 series · 13 of 25 positions shown · non-contrast
Comparison: December 2019

CLINICAL DATA: Prior ultrasound follow-up.

EXAM:
THYROID ULTRASOUND
TECHNIQUE: Ultrasound examination of the thyroid gland and adjacent soft
tissues was performed.

[Series 1: us thyroid · 0.08mm/px · 13 of 54 slices shown]
[im 1/54]
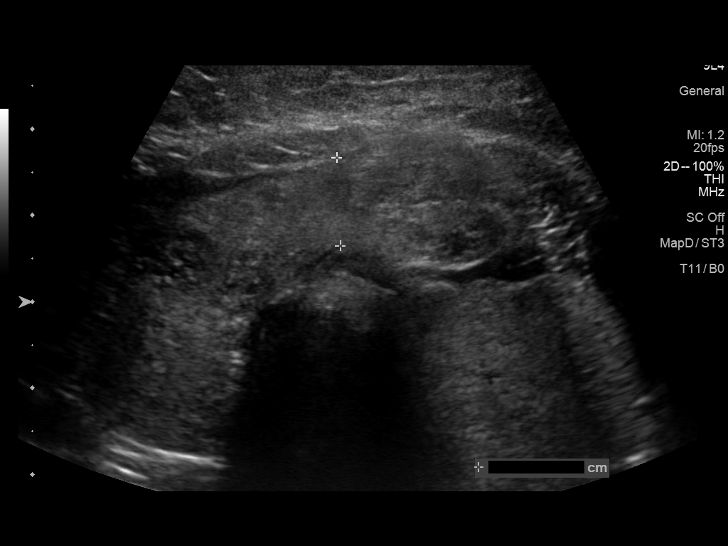
[im 5/54]
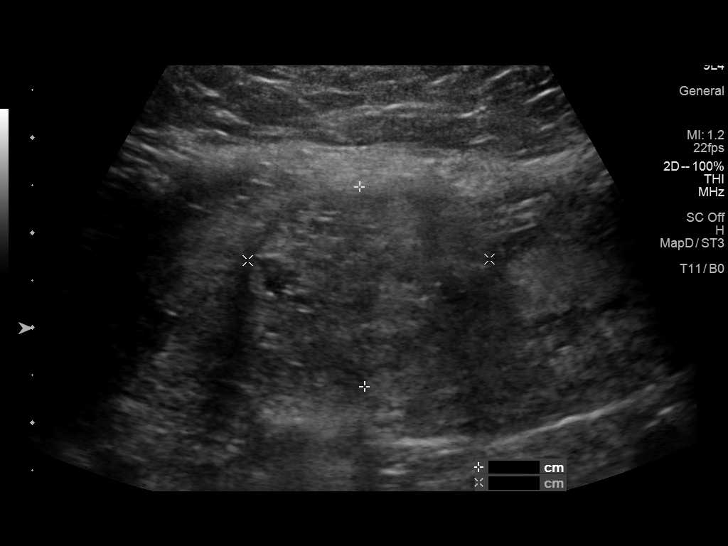
[im 9/54]
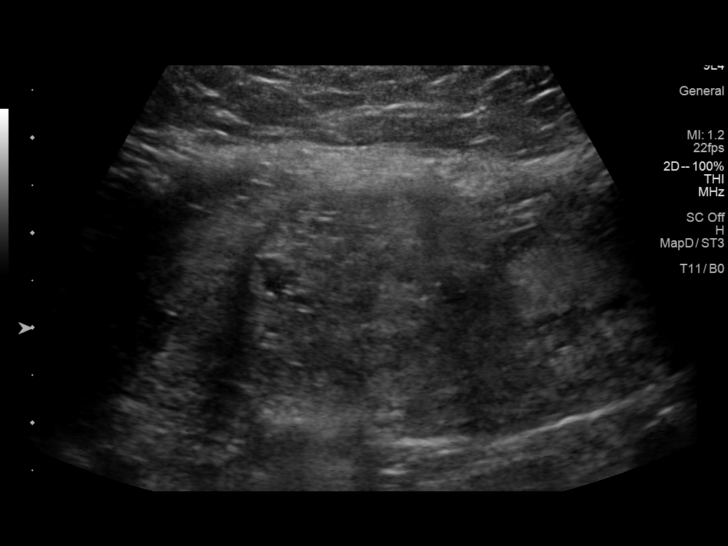
[im 14/54]
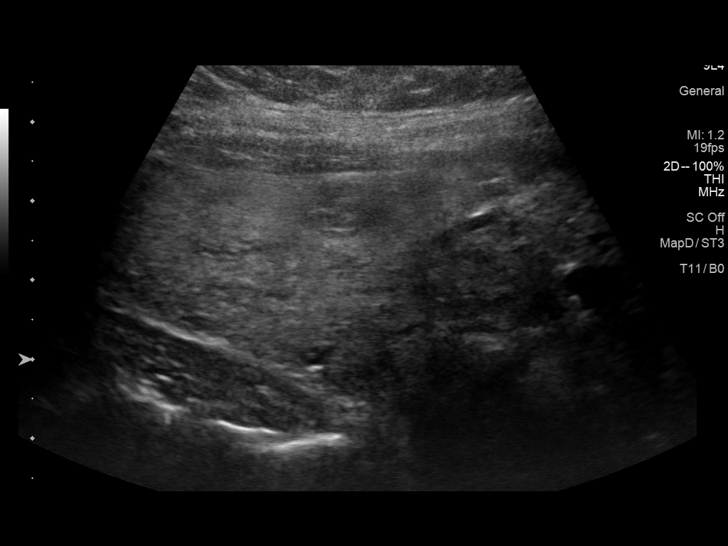
[im 18/54]
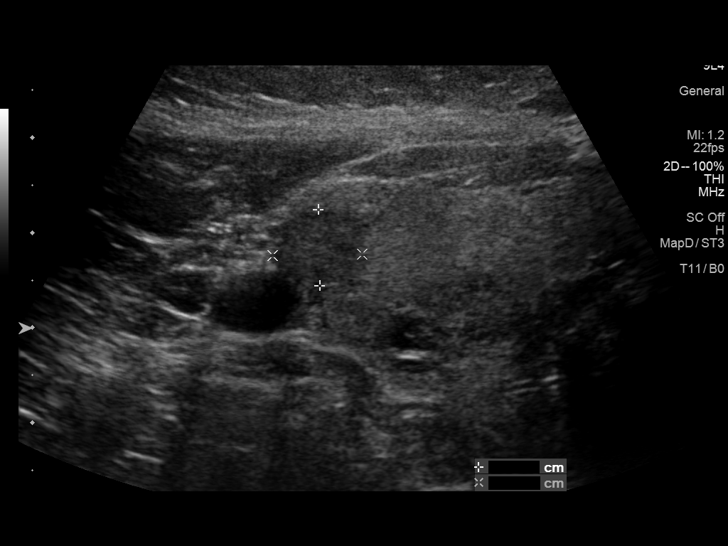
[im 23/54]
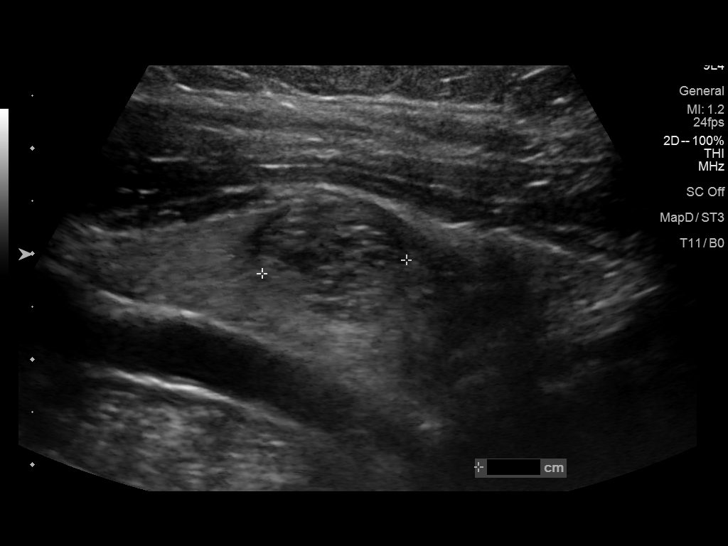
[im 27/54]
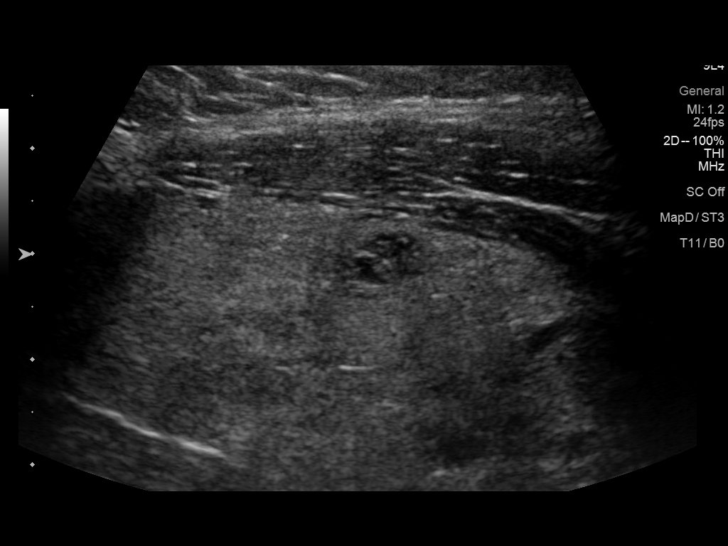
[im 31/54]
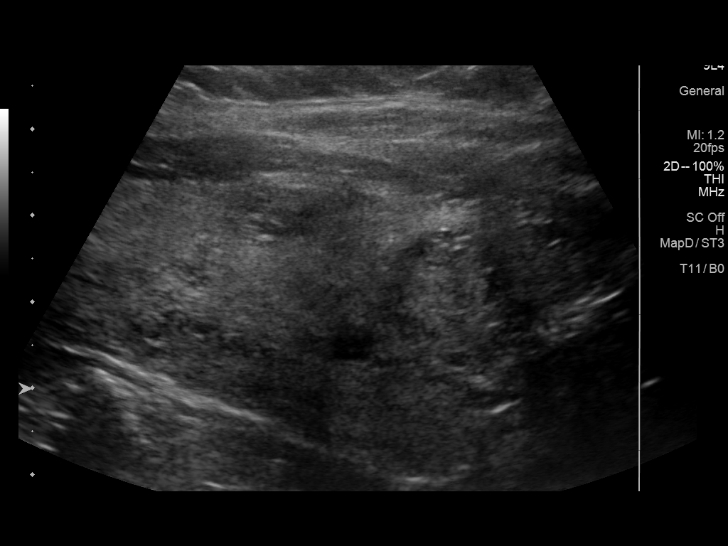
[im 36/54]
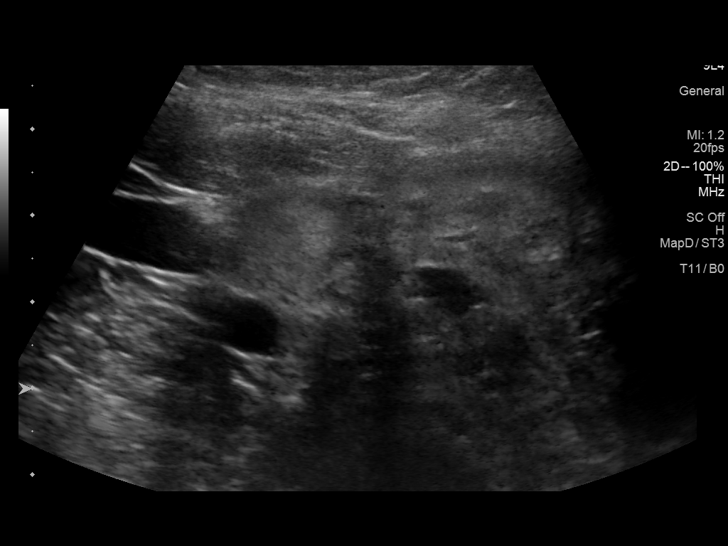
[im 40/54]
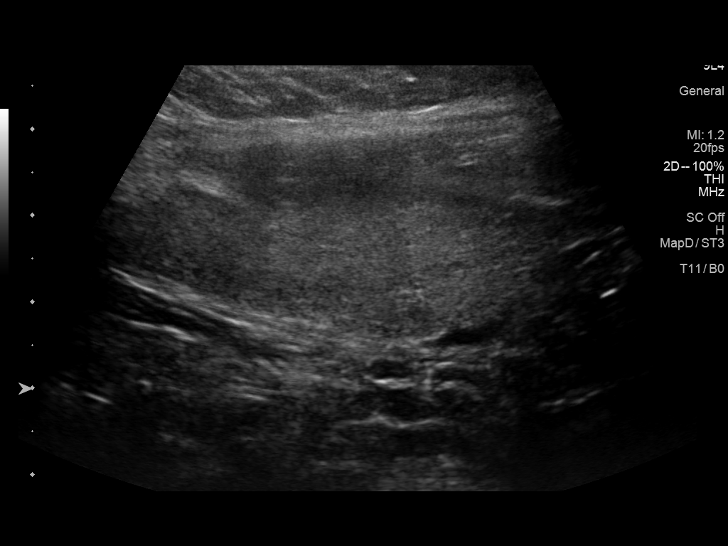
[im 45/54]
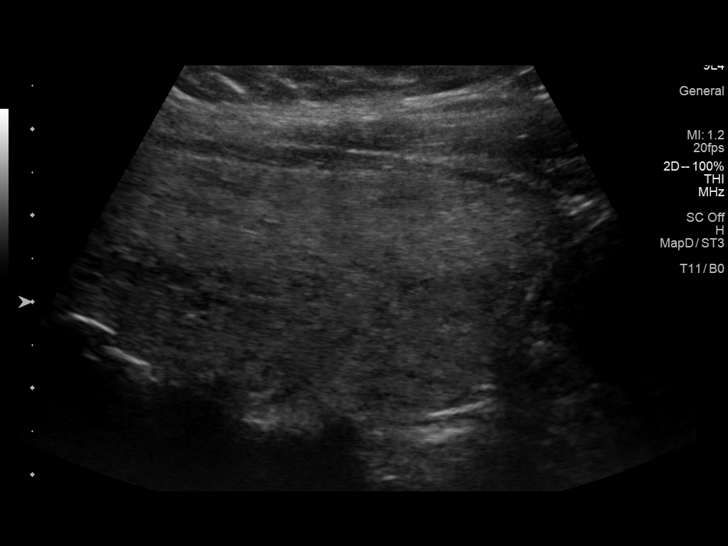
[im 49/54]
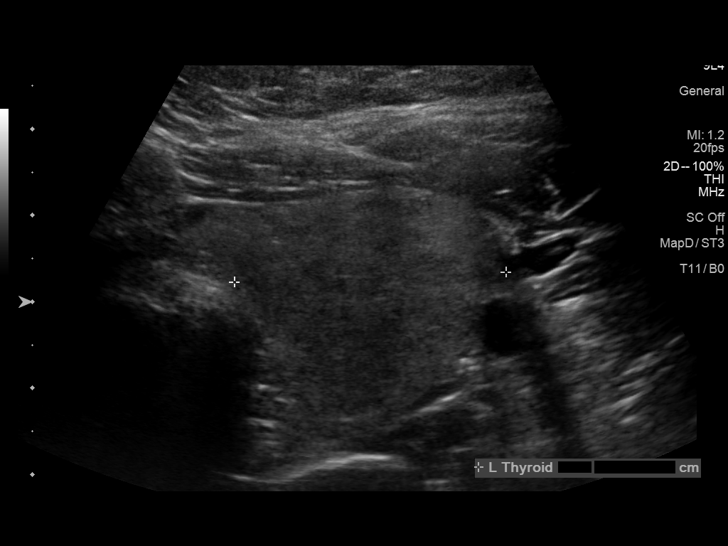
[im 54/54]
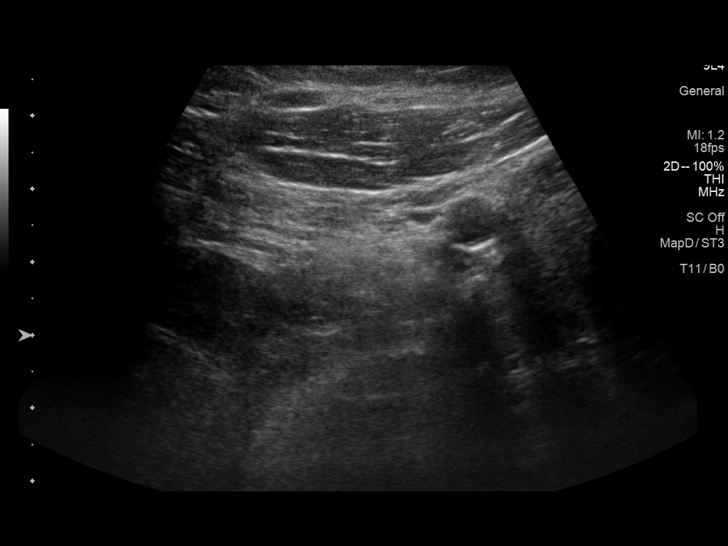

[13 of 25 positions shown; findings below may reference images not displayed]

FINDINGS: Parenchymal Echotexture: Moderately heterogenous

Isthmus: 1.0 cm

Right lobe: 6.4 x 2.9 x 3.0 cm

Left lobe: 6.8 x 3.0 x 3.2 cm

_________________________________________________________

Estimated total number of nodules >/= 1 cm: 8

Number of spongiform nodules >/=  2 cm not described below (TR1): 0

Number of mixed cystic and solid nodules >/= 1.5 cm not described
below (TR2): 0

_________________________________________________________

Nodule labeled 1 is a solid isoechoic TR 3 nodule in the superior
isthmus that measures 2.8 x 2.7 x 1.3 cm. It previously measured up
to 2.4 cm in 6166. **Given size (>/= 2.5 cm) and appearance, fine
needle aspiration of this mildly suspicious nodule should be
considered based on TI-RADS criteria.

Nodule labeled 2 is a solid isoechoic TR 3 nodule in the inferior
isthmus that measures 2.3 x 2.1 x 1.6 cm, previously measuring up to
2.4 cm. It remains similar in size and morphology. *Given size (>/=
1.5 - 2.4 cm) and appearance, a follow-up ultrasound in 1 year
should be considered based on TI-RADS criteria.

Nodule labeled 3 is a small subcentimeter solid hypoechoic TR 4
nodule in the superior right thyroid lobe. Given size (<0.9 cm) and
appearance, this nodule does NOT meet TI-RADS criteria for biopsy or
dedicated follow-up.

Nodule labeled 4 and 5 appear as a small spongiform nodules in the
mid right thyroid lobe measuring up to 1 cm in size. These nodules
do NOT meet TI-RADS criteria for biopsy or dedicated follow-up.

Nodule labeled 6 is a solid hypoechoic TR 4 nodule in the inferior
right thyroid lobe that measures 1.4 x 1.3 x 1.1 cm, previously
measuring up to 1.1 cm. It appears slightly increased in size, and
*Given size (>/= 1 - 1.4 cm) and appearance, a follow-up ultrasound
in 1 year should be considered based on TI-RADS criteria.

Nodule labeled 7 is a solid hypoechoic TR 4 nodule in the inferior
right thyroid lobe that measures 1.4 x 1.4 x 1.0 cm, previously
cm. It appears similar in size and morphology. *Given size (>/= 1 -
1.4 cm) and appearance, a follow-up ultrasound in 1 year should be
considered based on TI-RADS criteria.

Nodule labeled 8 appears to refer to a slightly heterogeneous area
of the left thyroid lobe, favored pseudo nodule.
IMPRESSION: 1. Enlarged multinodular thyroid gland.
2. Nodule labeled 1 in the thyroid isthmus (2.8 cm TR 3) has
increased in size and now meets criteria for biopsy.
3. Nodule labeled 2, 6 and 7 continue to meet criteria for follow-up
ultrasound in 1 year, with particular attention to nodule labeled 6
as it has demonstrated slight interval growth since 6166.

The above is in keeping with the ACR TI-RADS recommendations - [HOSPITAL] 4902;[DATE].

## 2022-10-07 IMAGING — US US FNA BIOPSY THYROID 1ST LESION
1 series · 13 of 15 positions shown · non-contrast
Comparison: Ultrasound thyroid dated January 08, 2022

MEDICATIONS:
None

COMPLICATIONS:
None immediate.

INDICATION: Indeterminate thyroid nodule

EXAM:
ULTRASOUND GUIDED FINE NEEDLE ASPIRATION OF INDETERMINATE THYROID
NODULE
TECHNIQUE: Informed written consent was obtained from the patient after a
discussion of the risks, benefits and alternatives to treatment.
Questions regarding the procedure were encouraged and answered. A
timeout was performed prior to the initiation of the procedure.

[Series 1: us fna biopsy thyroid 1st lesion · 0.06mm/px · 15 acquisitions, 13 frames shown]
[im 1/15]
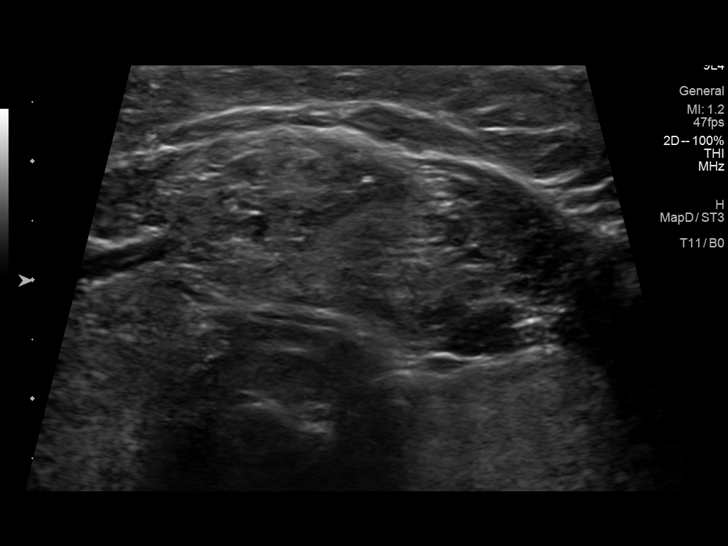
[im 2/15]
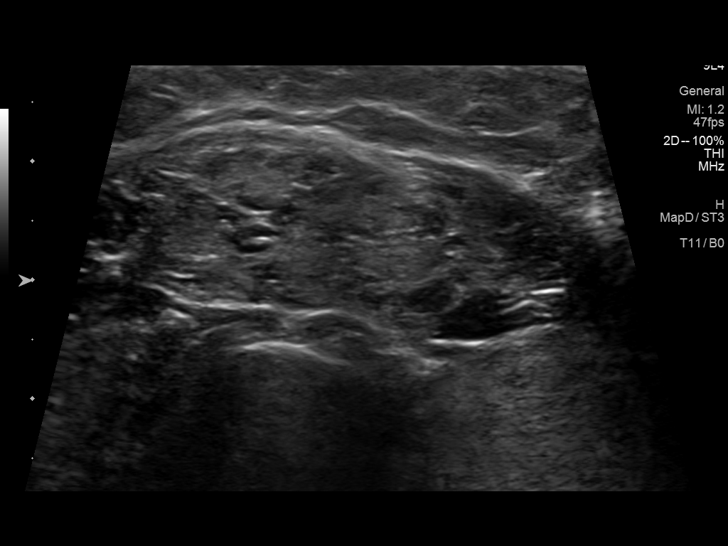
[im 3/15]
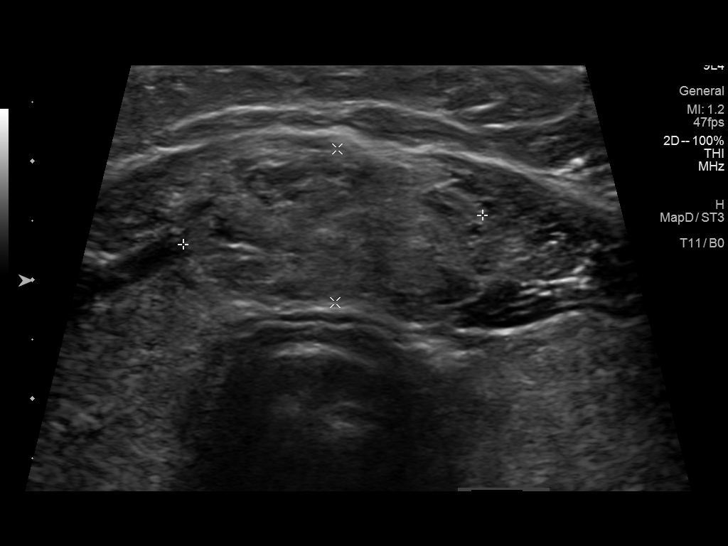
[im 5/15]
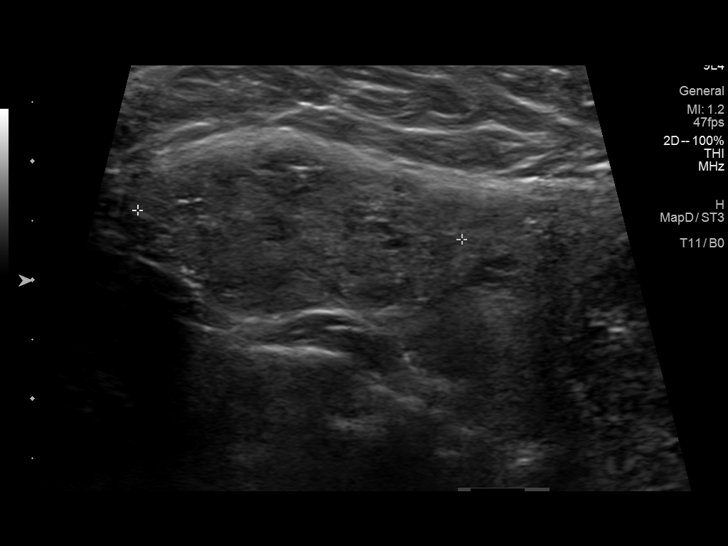
[im 6/15]
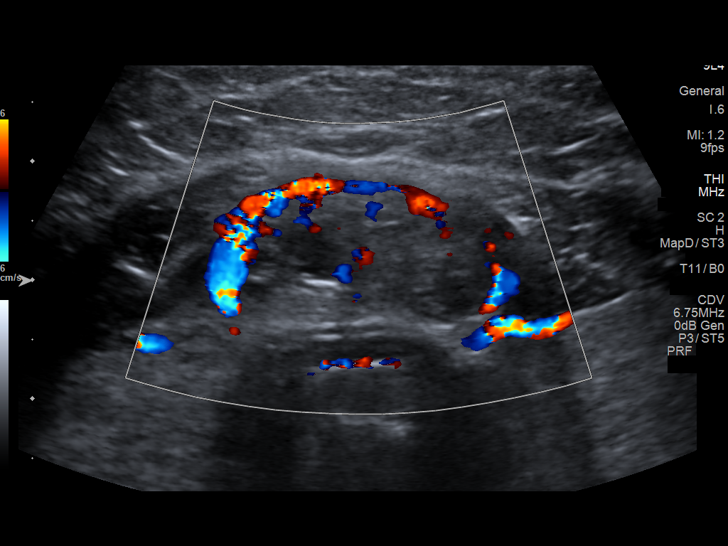
[im 7/15]
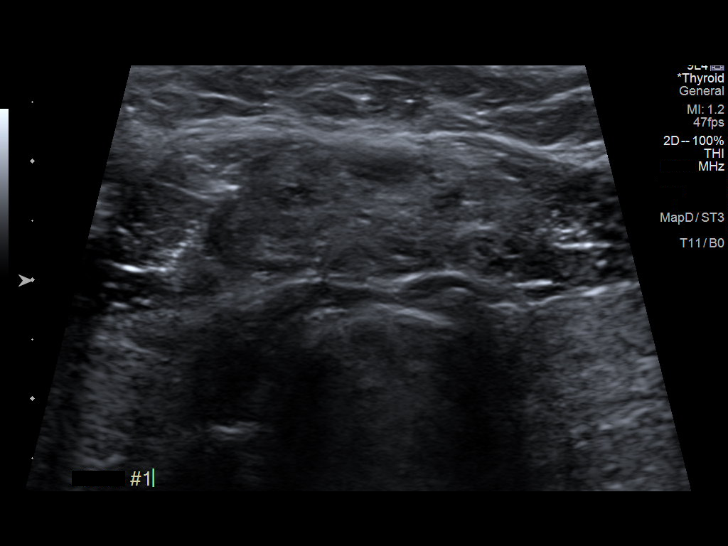
[im 8/15]
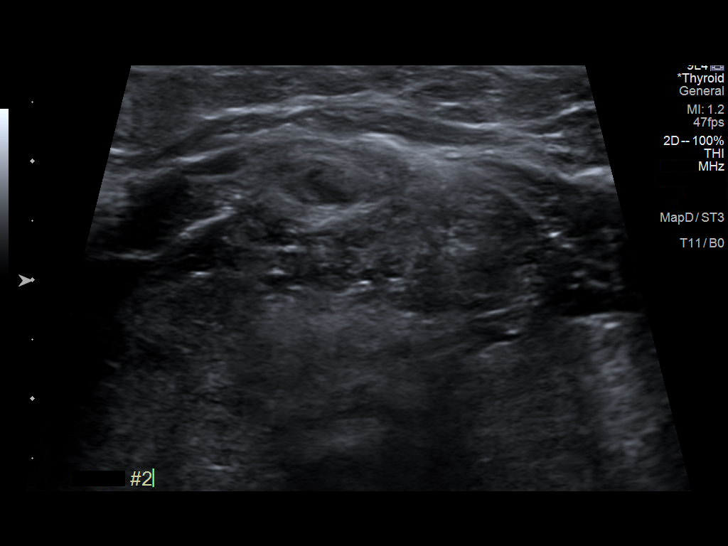
[im 9/15]
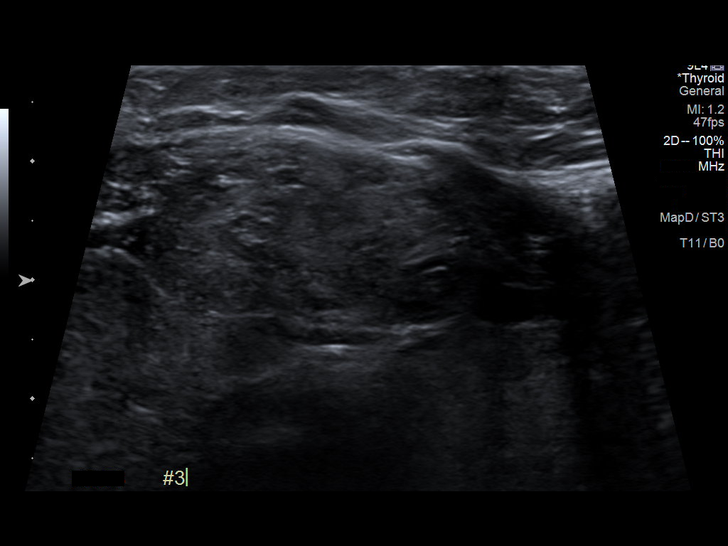
[im 10/15]
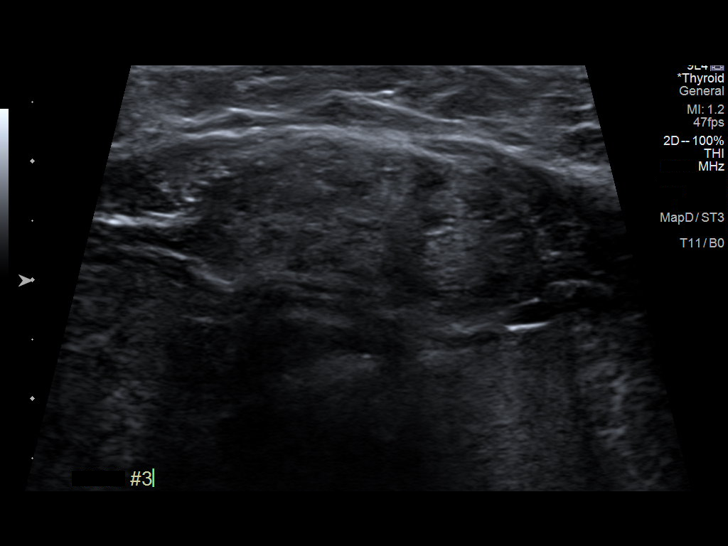
[im 11/15]
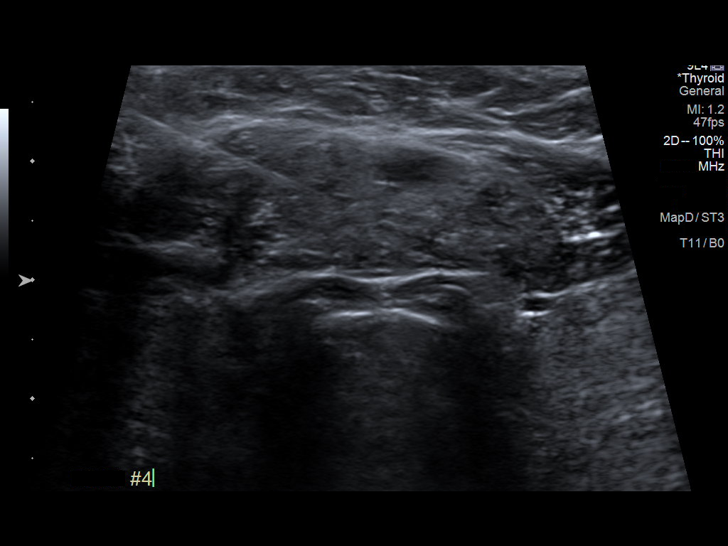
[im 13/15]
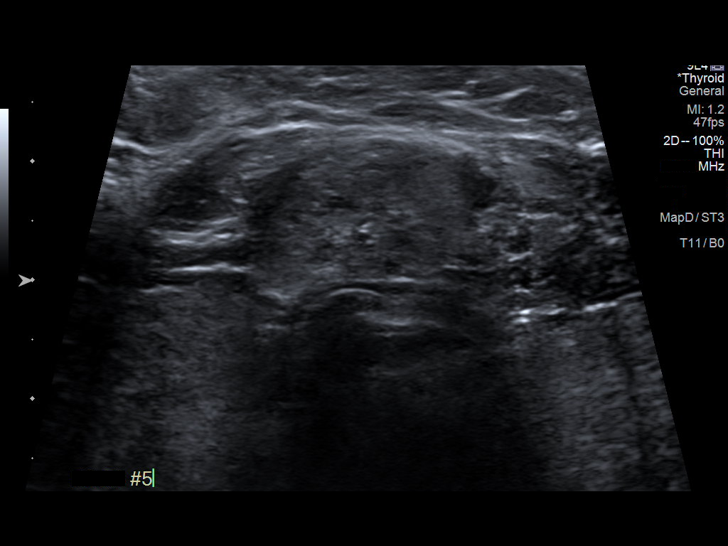
[im 14/15]
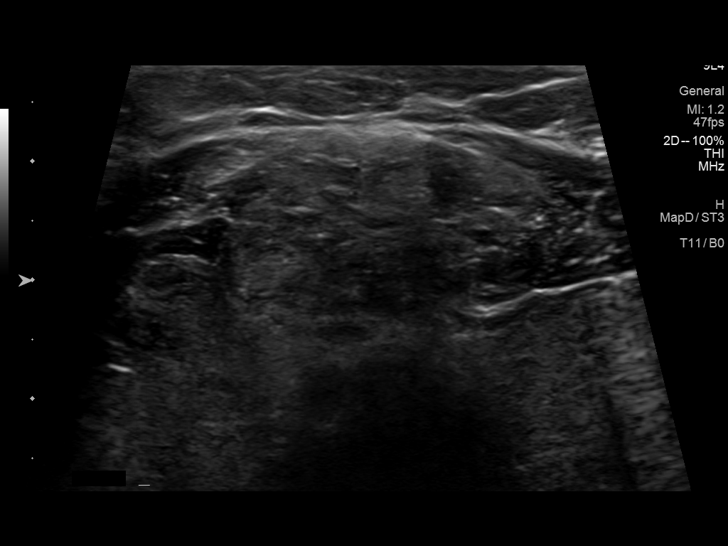
[im 15/15]
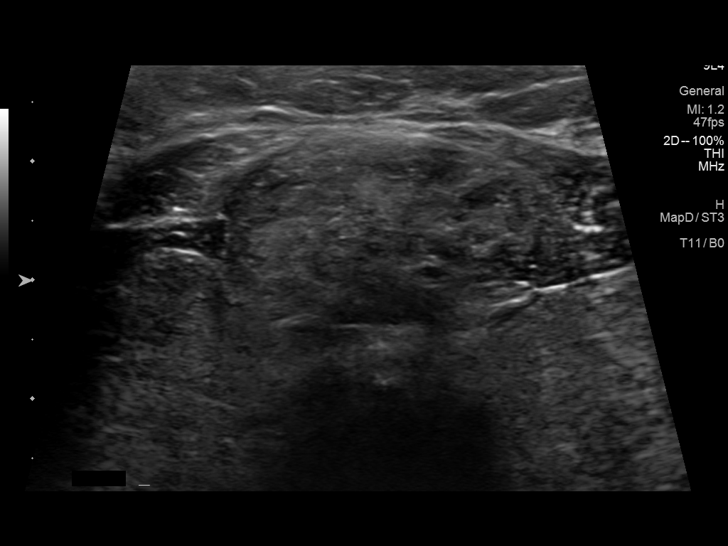

[13 of 15 positions shown; findings below may reference images not displayed]

Pre-procedural ultrasound scanning demonstrated unchanged size and
appearance of the indeterminate nodule within the superior aspect of
the isthmus

The procedure was planned. The neck was prepped in the usual sterile
fashion, and a sterile drape was applied covering the operative
field. A timeout was performed prior to the initiation of the
procedure. Local anesthesia was provided with 1% lidocaine.

Under direct ultrasound guidance, 4 FNA biopsies were performed of
the nodule located in the superior aspect of the isthmus with a 25
gauge needle.

Two samples were sent to [facility] per ordering JIM.

Multiple ultrasound images were saved for procedural documentation
purposes. The samples were prepared and submitted to pathology.

Limited post procedural scanning was negative for hematoma or
additional complication. Dressings were placed. The patient
tolerated the above procedures procedure well without immediate
postprocedural complication.
FINDINGS: Nodule reference number based on prior diagnostic ultrasound: 1

Maximum size: 2.8 cm

Location: Isthmus; Superior

ACR TI-RADS risk category: TR3 (3 points)

Reason for biopsy: meets ACR TI-RADS criteria

Ultrasound imaging confirms appropriate placement of the needles
within the thyroid nodule.
IMPRESSION: Technically successful ultrasound guided fine needle aspiration of
nodule located in the superior aspect of the isthmus

Read by: Abuzittin Batuk, NP

## 2022-10-14 DIAGNOSIS — D259 Leiomyoma of uterus, unspecified: Secondary | ICD-10-CM | POA: Diagnosis not present

## 2022-10-14 DIAGNOSIS — Z3041 Encounter for surveillance of contraceptive pills: Secondary | ICD-10-CM | POA: Diagnosis not present

## 2022-11-18 NOTE — Progress Notes (Signed)
Office Visit Note  Patient: Marie [name]             Date of Birth: 21-Apr-1981           MRN: [id number]             PCP: Wenda Low, MD Referring: Wenda Low, MD Visit Date: 12/02/2022 Occupation: '@GUAROCC'$ @  Subjective:  Medication monitoring   History of Present Illness: Marie [name] is a 42 y.o. female with history of seropositive rheumatoid arthritis.  Patient is currently taking plaquenil 200 mg 1 tablet by mouth twice daily Monday through Friday.  She is tolerating Plaquenil without any side effects and has not missed any doses recently.  She denies any [name] or symptoms of a rheumatoid arthritis flare.  She has not been experiencing any morning stiffness or nocturnal pain.  She denies any difficulty with ADLs.  She has been exercising 4 to 5 days/week without difficulty.  She denies any new medical conditions.  She has not had any recent or recurrent infections.  She is scheduled for an upcoming Plaquenil eye examination in June 2024.   Activities of Daily Living:  Patient reports morning stiffness for 0 minute.   Patient Denies nocturnal pain.  Difficulty dressing/grooming: Denies Difficulty climbing stairs: Denies Difficulty getting out of chair: Denies Difficulty using hands for taps, buttons, cutlery, and/or writing: Reports  Review of Systems  Constitutional:  Negative for fatigue.  HENT:  Positive for mouth dryness. Negative for mouth sores.   Eyes:  Positive for dryness.  Respiratory:  Negative for shortness of breath.   Cardiovascular:  Negative for chest pain and palpitations.  Gastrointestinal:  Negative for blood in stool, constipation and diarrhea.  Endocrine: Negative for increased urination.  Genitourinary:  Negative for involuntary urination.  Musculoskeletal:  Positive for joint pain, joint pain, myalgias, muscle weakness and myalgias. Negative for gait problem, joint swelling, morning stiffness and muscle tenderness.  Skin:  Positive for  sensitivity to sunlight. Negative for color change, rash and hair loss.  Allergic/Immunologic: Negative for susceptible to infections.  Neurological:  Negative for dizziness and headaches.  Hematological:  Negative for swollen glands.  Psychiatric/Behavioral:  Negative for depressed mood and sleep disturbance. The patient is not nervous/anxious.     PMFS History:  Patient Active Problem List   Diagnosis Date Noted   Seropositive rheumatoid arthritis of multiple sites ([place]) 11/16/2016   History of goiter 11/16/2016   Family history of sarcoidosis 11/16/2016   History of gastroesophageal reflux (GERD) 11/03/2016   Abnormal serum angiotensin-converting enzyme level,has had positive rheumatoid factor, and anti-DNAse B in the past.   11/03/2016   High serum angiotensin converting enzyme (ACE) 11/03/2016   Goiter 08/05/2016   Gastroesophageal reflux disease without esophagitis 08/05/2016    Past Medical History:  Diagnosis Date   Enlarged thyroid    no current med.   GERD (gastroesophageal reflux disease)    Hypertension    states under control with meds., has been on med. since 03/2017   Macromastia 06/2017   Rheumatoid arthritis ([place])    Sleep apnea    no CPAP use but did bring CPAP machine with her   Vertigo     Family History  Problem Relation Age of Onset   Hypertension Mother    Arthritis Mother    Sarcoidosis Mother    Diabetes Other    Diabetes Maternal Grandmother    Congestive Heart Failure Maternal Grandmother    Cancer Maternal Grandfather  Past Surgical History:  Procedure Laterality Date   BREAST REDUCTION SURGERY Bilateral 07/06/2017   Procedure: MAMMARY REDUCTION  (BREAST);  Surgeon: Cristine Polio, MD;  Location: [place];  Service: Plastics;  Laterality: Bilateral;   CHROMOPERTUBATION  05/30/2011   HYSTEROSCOPY DIAGNOSTIC  05/30/2011   LAPAROSCOPIC LYSIS OF ADHESIONS  05/30/2011   LAPAROSCOPIC UNILATERAL SALPINGECTOMY Right 09/19/2010    REDUCTION MAMMAPLASTY Bilateral 2018   Social History   Social History Narrative   Not on file   Immunization History  Administered Date(s) Administered   PFIZER(Purple Top)SARS-COV-2 Vaccination 11/19/2019, 12/10/2019, 08/10/2020     Objective: Vital [name]: BP 114/78 (BP Location: Left Arm, Patient Position: Sitting, Cuff Size: Large)   Pulse 69   Resp 14   Ht '5\' 2"'$  (1.575 m)   Wt 215 lb (97.5 kg)   BMI 39.32 kg/m    Physical Exam Vitals and nursing note reviewed.  Constitutional:      Appearance: She is well-developed.  HENT:     Head: Normocephalic and atraumatic.  Eyes:     Conjunctiva/sclera: Conjunctivae normal.  Cardiovascular:     Rate and Rhythm: Normal rate and regular rhythm.     Heart sounds: Normal heart sounds.  Pulmonary:     Effort: Pulmonary effort is normal.     Breath sounds: Normal breath sounds.  Abdominal:     General: Bowel sounds are normal.     Palpations: Abdomen is soft.  Musculoskeletal:     Cervical back: Normal range of motion.  Skin:    General: Skin is warm and dry.     Capillary Refill: Capillary refill takes less than 2 seconds.  Neurological:     Mental Status: She is alert and oriented to person, place, and time.  Psychiatric:        Behavior: Behavior normal.      Musculoskeletal Exam: C-spine, thoracic spine, lumbar spine have good range of motion.  Shoulder joints, elbow joints, wrist joints, MCPs, PIPs, DIPs have good range of motion with no synovitis.  Complete fist formation bilaterally.  Hip joints have good range of motion with no groin pain.  No tenderness over the trochanteric bursa bilaterally.  Knee joints have good range of motion with no warmth or effusion.  Ankle joints have good range of motion with no tenderness or joint swelling.  CDAI Exam: CDAI Score: -- Patient Global: 2 mm; Provider Global: 2 mm Swollen: --; Tender: -- Joint Exam 12/02/2022   No joint exam has been documented for this visit    There is currently no information documented on the homunculus. Go to the Rheumatology activity and complete the homunculus joint exam.  Investigation: No additional findings.  Imaging: No results found.  Recent Labs: Lab Results  Component Value Date   WBC 4.4 07/01/2022   HGB 14.0 07/01/2022   PLT 291 07/01/2022   NA 141 07/01/2022   K 3.6 07/01/2022   CL 99 07/01/2022   CO2 32 07/01/2022   GLUCOSE 82 07/01/2022   BUN 11 07/01/2022   CREATININE 0.88 07/01/2022   BILITOT 0.3 07/01/2022   ALKPHOS 72 04/16/2017   AST 19 07/01/2022   ALT 25 07/01/2022   PROT 7.4 07/01/2022   ALBUMIN 4.4 04/16/2017   CALCIUM 9.8 07/01/2022   GFRAA 127 12/26/2020    Speciality Comments: PLQ eye exam: 03/04/2022 WNL Dr. Gershon Crane. Follow up 1 year.  Procedures:  No procedures performed Allergies: Iodine, Red dye, Shellfish allergy, Latex, and Sulfa antibiotics  Assessment / Plan:     Visit Diagnoses: Seropositive rheumatoid arthritis of multiple sites ([place]) - RF negative, anti-CCP positive, positive synovitis on ultrasound: She has no joint tenderness or synovitis on examination today.  She has not had any [name] or symptoms of a rheumatoid arthritis flare.  She has clinically been doing well taking Plaquenil 200 mg 1 tablet by mouth twice daily Monday through Friday.  She is tolerating Plaquenil without any side effects and has not missed any doses recently.  She has not had any morning stiffness, nocturnal pain, or difficulty with ADLs.  She has been exercising 4 to 5 days/week without difficulty.  She will remain on Plaquenil as prescribed.  Updated lab work including CBC and CMP will be obtained today.  She was advised to notify us if she develops [name] or symptoms of a flare.  She will follow-up in the office in 5 months or sooner if needed.- Plan: hydroxychloroquine (PLAQUENIL) 200 MG tablet  High risk medication use - Plaquenil 200 mg 1 tablet twice daily Monday through Friday only.  PLQ  eye exam: 03/04/2022 WNL Dr. Gershon Crane. Follow up 1 year.  She is scheduled for next like an eye examination in June 2024. CBC and CMP WNL on 07/01/22.  Orders for CBC and CMP released today.   She denies any new medical conditions.  She denies any recent or recurrent infections.  - Plan: CBC with Differential/Platelet, COMPLETE METABOLIC PANEL WITH GFR  Pain in both hands - Early osteoarthritic changes were noted in the x-rays.  No tenderness or synovitis noted today.  Complete fist formation noted bilaterally.  Lateral epicondylitis of both elbows: She has discomfort intermittently.  No tenderness upon palpation today.  Chronic pain of both knees: She has good range of motion of both knee joints on examination today.  No warmth or effusion noted.  Pain in both feet - BIiateral calcaneal spurs: She is not experiencing any increased discomfort in her feet at this time.  She is wearing proper fitting shoes.  She has good range of motion of both ankle joints with no tenderness or synovitis.  Other medical conditions are listed as follows:  BMI 40.0-44.9, adult ([place])  History of gastroesophageal reflux (GERD)  History of goiter  Family history of sarcoidosis    Orders: Orders Placed This Encounter  Procedures   CBC with Differential/Platelet   COMPLETE METABOLIC PANEL WITH GFR   Meds ordered this encounter  Medications   hydroxychloroquine (PLAQUENIL) 200 MG tablet    Sig: Take 1 tablet by mouth twice daily Monday through Friday.    Dispense:  120 tablet    Refill:  0     Follow-Up Instructions: Return in about 5 months (around 05/04/2023) for Rheumatoid arthritis.   Ofilia Neas, PA-C  Note - This record has been created using Dragon software.  Chart creation errors have been sought, but may not always  have been located. Such creation errors do not reflect on  the standard of medical care.

## 2022-12-02 ENCOUNTER — Ambulatory Visit: Payer: BC Managed Care – PPO | Attending: Physician Assistant | Admitting: Physician Assistant

## 2022-12-02 ENCOUNTER — Encounter: Payer: Self-pay | Admitting: Physician Assistant

## 2022-12-02 VITALS — BP 114/78 | HR 69 | Resp 14 | Ht 62.0 in | Wt 215.0 lb

## 2022-12-02 DIAGNOSIS — M25562 Pain in left knee: Secondary | ICD-10-CM

## 2022-12-02 DIAGNOSIS — Z832 Family history of diseases of the blood and blood-forming organs and certain disorders involving the immune mechanism: Secondary | ICD-10-CM

## 2022-12-02 DIAGNOSIS — Z79899 Other long term (current) drug therapy: Secondary | ICD-10-CM

## 2022-12-02 DIAGNOSIS — M25561 Pain in right knee: Secondary | ICD-10-CM

## 2022-12-02 DIAGNOSIS — G8929 Other chronic pain: Secondary | ICD-10-CM

## 2022-12-02 DIAGNOSIS — Z8639 Personal history of other endocrine, nutritional and metabolic disease: Secondary | ICD-10-CM

## 2022-12-02 DIAGNOSIS — M79641 Pain in right hand: Secondary | ICD-10-CM

## 2022-12-02 DIAGNOSIS — M0579 Rheumatoid arthritis with rheumatoid factor of multiple sites without organ or systems involvement: Secondary | ICD-10-CM | POA: Diagnosis not present

## 2022-12-02 DIAGNOSIS — M7712 Lateral epicondylitis, left elbow: Secondary | ICD-10-CM

## 2022-12-02 DIAGNOSIS — Z8719 Personal history of other diseases of the digestive system: Secondary | ICD-10-CM

## 2022-12-02 DIAGNOSIS — M79671 Pain in right foot: Secondary | ICD-10-CM

## 2022-12-02 DIAGNOSIS — M79672 Pain in left foot: Secondary | ICD-10-CM

## 2022-12-02 DIAGNOSIS — M7711 Lateral epicondylitis, right elbow: Secondary | ICD-10-CM | POA: Diagnosis not present

## 2022-12-02 DIAGNOSIS — M79642 Pain in left hand: Secondary | ICD-10-CM

## 2022-12-02 DIAGNOSIS — Z6841 Body Mass Index (BMI) 40.0 and over, adult: Secondary | ICD-10-CM

## 2022-12-02 MED ORDER — HYDROXYCHLOROQUINE SULFATE 200 MG PO TABS: ORAL_TABLET | ORAL | 0 refills | Status: DC

## 2022-12-03 LAB — CBC WITH DIFFERENTIAL/PLATELET
Absolute Monocytes: 461 cells/uL (ref 200–950)
Basophils Absolute: 42 cells/uL (ref 0–200)
Basophils Relative: 0.8 %
Eosinophils Absolute: 111 cells/uL (ref 15–500)
Eosinophils Relative: 2.1 %
HCT: 38.4 % (ref 35.0–45.0)
Hemoglobin: 13.2 g/dL (ref 11.7–15.5)
Lymphs Abs: 2586 cells/uL (ref 850–3900)
MCH: 30.3 pg (ref 27.0–33.0)
MCHC: 34.4 g/dL (ref 32.0–36.0)
MCV: 88.3 fL (ref 80.0–100.0)
MPV: 10 fL (ref 7.5–12.5)
Monocytes Relative: 8.7 %
Neutro Abs: 2099 cells/uL (ref 1500–7800)
Neutrophils Relative %: 39.6 %
Platelets: 309 10*3/uL (ref 140–400)
RBC: 4.35 10*6/uL (ref 3.80–5.10)
RDW: 12.9 % (ref 11.0–15.0)
Total Lymphocyte: 48.8 %
WBC: 5.3 10*3/uL (ref 3.8–10.8)

## 2022-12-03 LAB — COMPLETE METABOLIC PANEL WITH GFR
AG Ratio: 1.7 (calc) (ref 1.0–2.5)
ALT: 20 U/L (ref 6–29)
AST: 12 U/L (ref 10–30)
Albumin: 4.5 g/dL (ref 3.6–5.1)
Alkaline phosphatase (APISO): 47 U/L (ref 31–125)
BUN: 13 mg/dL (ref 7–25)
CO2: 27 mmol/L (ref 20–32)
Calcium: 9.6 mg/dL (ref 8.6–10.2)
Chloride: 102 mmol/L (ref 98–110)
Creat: 0.9 mg/dL (ref 0.50–0.99)
Globulin: 2.7 g/dL (calc) (ref 1.9–3.7)
Glucose, Bld: 85 mg/dL (ref 65–99)
Potassium: 3.7 mmol/L (ref 3.5–5.3)
Sodium: 139 mmol/L (ref 135–146)
Total Bilirubin: 0.3 mg/dL (ref 0.2–1.2)
Total Protein: 7.2 g/dL (ref 6.1–8.1)
eGFR: 82 mL/min/{1.73_m2} (ref >=60)

## 2022-12-03 NOTE — Progress Notes (Signed)
CBC and CMP WNL

## 2023-01-21 DIAGNOSIS — Z13220 Encounter for screening for lipoid disorders: Secondary | ICD-10-CM | POA: Diagnosis not present

## 2023-01-21 DIAGNOSIS — Z23 Encounter for immunization: Secondary | ICD-10-CM | POA: Diagnosis not present

## 2023-01-21 DIAGNOSIS — E041 Nontoxic single thyroid nodule: Secondary | ICD-10-CM | POA: Diagnosis not present

## 2023-01-21 DIAGNOSIS — Z0000 Encounter for general adult medical examination without abnormal findings: Secondary | ICD-10-CM | POA: Diagnosis not present

## 2023-01-21 DIAGNOSIS — Z6838 Body mass index (BMI) 38.0-38.9, adult: Secondary | ICD-10-CM | POA: Diagnosis not present

## 2023-01-21 DIAGNOSIS — E1169 Type 2 diabetes mellitus with other specified complication: Secondary | ICD-10-CM | POA: Diagnosis not present

## 2023-01-21 DIAGNOSIS — I10 Essential (primary) hypertension: Secondary | ICD-10-CM | POA: Diagnosis not present

## 2023-03-05 DIAGNOSIS — E119 Type 2 diabetes mellitus without complications: Secondary | ICD-10-CM | POA: Diagnosis not present

## 2023-03-05 DIAGNOSIS — H5201 Hypermetropia, right eye: Secondary | ICD-10-CM | POA: Diagnosis not present

## 2023-03-05 DIAGNOSIS — H5212 Myopia, left eye: Secondary | ICD-10-CM | POA: Diagnosis not present

## 2023-03-05 DIAGNOSIS — Z79899 Other long term (current) drug therapy: Secondary | ICD-10-CM | POA: Diagnosis not present

## 2023-03-05 DIAGNOSIS — M069 Rheumatoid arthritis, unspecified: Secondary | ICD-10-CM | POA: Diagnosis not present

## 2023-04-15 DIAGNOSIS — D259 Leiomyoma of uterus, unspecified: Secondary | ICD-10-CM | POA: Diagnosis not present

## 2023-04-21 NOTE — Progress Notes (Deleted)
Office Visit Note  Patient: Marie [name]             Date of Birth: 1981/06/22           MRN: [id number]             PCP: Georgann Housekeeper, MD Referring: Georgann Housekeeper, MD Visit Date: 05/05/2023 Occupation: @GUAROCC @  Subjective:  No chief complaint on file.   History of Present Illness: Marie [name] is a 42 y.o. female ***     Activities of Daily Living:  Patient reports morning stiffness for *** {minute/hour:19697}.   Patient {ACTIONS;DENIES/REPORTS:21021675::"Denies"} nocturnal pain.  Difficulty dressing/grooming: {ACTIONS;DENIES/REPORTS:21021675::"Denies"} Difficulty climbing stairs: {ACTIONS;DENIES/REPORTS:21021675::"Denies"} Difficulty getting out of chair: {ACTIONS;DENIES/REPORTS:21021675::"Denies"} Difficulty using hands for taps, buttons, cutlery, and/or writing: {ACTIONS;DENIES/REPORTS:21021675::"Denies"}  No Rheumatology ROS completed.   PMFS History:  Patient Active Problem List   Diagnosis Date Noted   Seropositive rheumatoid arthritis of multiple sites ([facility]) 11/16/2016   History of goiter 11/16/2016   Family history of sarcoidosis 11/16/2016   History of gastroesophageal reflux (GERD) 11/03/2016   Abnormal serum angiotensin-converting enzyme level,has had positive rheumatoid factor, and anti-DNAse B in the past.   11/03/2016   High serum angiotensin converting enzyme (ACE) 11/03/2016   Goiter 08/05/2016   Gastroesophageal reflux disease without esophagitis 08/05/2016    Past Medical History:  Diagnosis Date   Enlarged thyroid    no current med.   GERD (gastroesophageal reflux disease)    Hypertension    states under control with meds., has been on med. since 03/2017   Macromastia 06/2017   Rheumatoid arthritis ([facility])    Sleep apnea    no CPAP use but did bring CPAP machine with her   Vertigo     Family History  Problem Relation Age of Onset   Hypertension Mother    Arthritis Mother    Sarcoidosis Mother    Diabetes Other    Diabetes  Maternal Grandmother    Congestive Heart Failure Maternal Grandmother    Cancer Maternal Grandfather    Past Surgical History:  Procedure Laterality Date   BREAST REDUCTION SURGERY Bilateral 07/06/2017   Procedure: MAMMARY REDUCTION  (BREAST);  Surgeon: Louisa Second, MD;  [facility];  Service: Plastics;  Laterality: Bilateral;   CHROMOPERTUBATION  05/30/2011   HYSTEROSCOPY DIAGNOSTIC  05/30/2011   LAPAROSCOPIC LYSIS OF ADHESIONS  05/30/2011   LAPAROSCOPIC UNILATERAL SALPINGECTOMY Right 09/19/2010   REDUCTION MAMMAPLASTY Bilateral 2018   Social History   Social History Narrative   Not on file   Immunization History  Administered Date(s) Administered   PFIZER(Purple Top)SARS-COV-2 Vaccination 11/19/2019, 12/10/2019, 08/10/2020     Objective: Vital Signs: There were no vitals taken for this visit.   Physical Exam   Musculoskeletal Exam: ***  CDAI Exam: CDAI Score: -- Patient Global: --; Provider Global: -- Swollen: --; Tender: -- Joint Exam 05/05/2023   No joint exam has been documented for this visit   There is currently no information documented on the homunculus. Go to the Rheumatology activity and complete the homunculus joint exam.  Investigation: No additional findings.  Imaging: No results found.  Recent Labs: Lab Results  Component Value Date   WBC 5.3 12/02/2022   HGB 13.2 12/02/2022   PLT 309 12/02/2022   NA 139 12/02/2022   K 3.7 12/02/2022   CL 102 12/02/2022   CO2 27 12/02/2022   GLUCOSE 85 12/02/2022   BUN 13 12/02/2022   CREATININE 0.90 12/02/2022   BILITOT 0.3 12/02/2022  ALKPHOS 72 04/16/2017   AST 12 12/02/2022   ALT 20 12/02/2022   PROT 7.2 12/02/2022   ALBUMIN 4.4 04/16/2017   CALCIUM 9.6 12/02/2022   GFRAA 127 12/26/2020    Speciality Comments: PLQ eye exam: 03/05/2023 WNL Dr. Nile Riggs. Follow up 1 year.  Procedures:  No procedures performed Allergies: Iodine, Red dye, Shellfish allergy, Latex,  and Sulfa antibiotics   Assessment / Plan:     Visit Diagnoses: Seropositive rheumatoid arthritis of multiple sites ([facility])  High risk medication use  Pain in both hands  Lateral epicondylitis of both elbows  Chronic pain of both knees  Pain in both feet  BMI 40.0-44.9, adult ([facility])  History of gastroesophageal reflux (GERD)  History of goiter  Family history of sarcoidosis  Orders: No orders of the defined types were placed in this encounter.  No orders of the defined types were placed in this encounter.   Face-to-face time spent with patient was *** minutes. Greater than 50% of time was spent in counseling and coordination of care.  Follow-Up Instructions: No follow-ups on file.   Gearldine Bienenstock, PA-C  Note - This record has been created using Dragon software.  Chart creation errors have been sought, but may not always  have been located. Such creation errors do not reflect on  the standard of medical care.

## 2023-05-04 NOTE — Progress Notes (Unsigned)
Office Visit Note  Patient: Marie [name]             Date of Birth: 04-25-1981           MRN: [id number]             PCP: Georgann Housekeeper, MD Referring: Georgann Housekeeper, MD Visit Date: 05/11/2023 Occupation: @GUAROCC @  Subjective:  Medication monitoring   History of Present Illness: Marie [name] is a 42 y.o. female with history of seropositive rheumatoid arthritis.  Patient remains on Plaquenil 200 mg 1 tablet twice daily Monday through Friday only.  She is tolerating Plaquenil without any side effects and has not missed any doses recently.  Patient denies any signs or symptoms of a rheumatoid arthritis flare.  She denies any morning stiffness or nocturnal pain.  She has not had any difficulty with ADLs.  She has not had any gaps in therapy recently.  Patient has been working on weight loss and is currently on mounjaro.  She has been exercising several days a week including using the elliptical and upper extremity strengthening.   She is planning on continuing yoga and zumba.  She denies any new medical conditions.   Activities of Daily Living:  Patient reports morning stiffness for 0 minutes  Patient Denies nocturnal pain.  Difficulty dressing/grooming: Denies Difficulty climbing stairs: Denies Difficulty getting out of chair: Denies Difficulty using hands for taps, buttons, cutlery, and/or writing: Denies  Review of Systems  Constitutional:  Negative for fatigue.  HENT:  Negative for mouth sores and mouth dryness.   Eyes:  Positive for dryness.  Respiratory:  Negative for shortness of breath.   Cardiovascular:  Negative for chest pain and palpitations.  Gastrointestinal:  Negative for blood in stool, constipation and diarrhea.  Endocrine: Negative for increased urination.  Genitourinary:  Negative for involuntary urination.  Musculoskeletal:  Negative for joint pain, gait problem, joint pain, joint swelling, myalgias, muscle weakness, morning stiffness, muscle tenderness  and myalgias.  Skin:  Positive for sensitivity to sunlight. Negative for color change, rash and hair loss.  Allergic/Immunologic: Negative for susceptible to infections.  Neurological:  Negative for dizziness and headaches.  Hematological:  Negative for swollen glands.  Psychiatric/Behavioral:  Negative for depressed mood and sleep disturbance. The patient is not nervous/anxious.     PMFS History:  Patient Active Problem List   Diagnosis Date Noted   Seropositive rheumatoid arthritis of multiple sites (HCC) 11/16/2016   History of goiter 11/16/2016   Family history of sarcoidosis 11/16/2016   History of gastroesophageal reflux (GERD) 11/03/2016   Abnormal serum angiotensin-converting enzyme level,has had positive rheumatoid factor, and anti-DNAse B in the past.   11/03/2016   High serum angiotensin converting enzyme (ACE) 11/03/2016   Goiter 08/05/2016   Gastroesophageal reflux disease without esophagitis 08/05/2016    Past Medical History:  Diagnosis Date   Enlarged thyroid    no current med.   GERD (gastroesophageal reflux disease)    Hypertension    states under control with meds., has been on med. since 03/2017   Macromastia 06/2017   Rheumatoid arthritis (HCC)    Sleep apnea    no CPAP use but did bring CPAP machine with her   Vertigo     Family History  Problem Relation Age of Onset   Hypertension Mother    Arthritis Mother    Sarcoidosis Mother    Diabetes Other    Diabetes Maternal Grandmother    Congestive Heart Failure Maternal Grandmother  Cancer Maternal Grandfather    Past Surgical History:  Procedure Laterality Date   BREAST REDUCTION SURGERY Bilateral 07/06/2017   Procedure: MAMMARY REDUCTION  (BREAST);  Surgeon: Louisa Second, MD;  Location: [facility];  Service: Plastics;  Laterality: Bilateral;   CHROMOPERTUBATION  05/30/2011   HYSTEROSCOPY DIAGNOSTIC  05/30/2011   LAPAROSCOPIC LYSIS OF ADHESIONS  05/30/2011   LAPAROSCOPIC  UNILATERAL SALPINGECTOMY Right 09/19/2010   REDUCTION MAMMAPLASTY Bilateral 2018   Social History   Social History Narrative   Not on file   Immunization History  Administered Date(s) Administered   PFIZER(Purple Top)SARS-COV-2 Vaccination 11/19/2019, 12/10/2019, 08/10/2020     Objective: Vital Signs: BP 122/84 (BP Location: Left Arm, Patient Position: Sitting, Cuff Size: Normal)   Pulse 64   Resp 14   Ht 5\' 2"  (1.575 m)   Wt 201 lb (91.2 kg)   BMI 36.76 kg/m    Physical Exam Vitals and nursing note reviewed.  Constitutional:      Appearance: She is well-developed.  HENT:     Head: Normocephalic and atraumatic.  Eyes:     Conjunctiva/sclera: Conjunctivae normal.  Cardiovascular:     Rate and Rhythm: Normal rate and regular rhythm.     Heart sounds: Normal heart sounds.  Pulmonary:     Effort: Pulmonary effort is normal.     Breath sounds: Normal breath sounds.  Abdominal:     General: Bowel sounds are normal.     Palpations: Abdomen is soft.  Musculoskeletal:     Cervical back: Normal range of motion.  Lymphadenopathy:     Cervical: No cervical adenopathy.  Skin:    General: Skin is warm and dry.     Capillary Refill: Capillary refill takes less than 2 seconds.  Neurological:     Mental Status: She is alert and oriented to person, place, and time.  Psychiatric:        Behavior: Behavior normal.      Musculoskeletal Exam: C-spine, thoracic spine, lumbar spine have good range of motion with no discomfort.  Shoulder joints, elbow joints, wrist joints, MCPs, PIPs, DIPs have good range of motion with no synovitis.  Complete fist formation bilaterally.  Hip joints have good range of motion with no groin pain.  Knee joints have good range of motion with no warmth or effusion.  Ankle joints have good range of motion with no tenderness or joint swelling.  CDAI Exam: CDAI Score: -- Patient Global: 10 / 100; Provider Global: 10 / 100 Swollen: --; Tender: -- Joint Exam  05/11/2023   No joint exam has been documented for this visit   There is currently no information documented on the homunculus. Go to the Rheumatology activity and complete the homunculus joint exam.  Investigation: No additional findings.  Imaging: No results found.  Recent Labs: Lab Results  Component Value Date   WBC 5.3 12/02/2022   HGB 13.2 12/02/2022   PLT 309 12/02/2022   NA 139 12/02/2022   K 3.7 12/02/2022   CL 102 12/02/2022   CO2 27 12/02/2022   GLUCOSE 85 12/02/2022   BUN 13 12/02/2022   CREATININE 0.90 12/02/2022   BILITOT 0.3 12/02/2022   ALKPHOS 72 04/16/2017   AST 12 12/02/2022   ALT 20 12/02/2022   PROT 7.2 12/02/2022   ALBUMIN 4.4 04/16/2017   CALCIUM 9.6 12/02/2022   GFRAA 127 12/26/2020    Speciality Comments: PLQ eye exam: 03/05/2023 WNL Dr. Nile Riggs. Follow up 1 year.  Procedures:  No procedures  performed Allergies: Iodine, Red dye #40 (allura red), Shellfish allergy, Latex, and Sulfa antibiotics   Assessment / Plan:     Visit Diagnoses: Seropositive rheumatoid arthritis of multiple sites (HCC) - RF negative, anti-CCP positive, positive synovitis on ultrasound: She has no synovitis on examination today.  She has not had any signs or symptoms of a rheumatoid arthritis flare.  She has clinically been doing well taking Plaquenil 200 mg 1 tablet by mouth twice daily Monday through Friday.  She is tolerating Plaquenil without any side effects or interruptions in therapy.  Patient has been working on weight loss and is currently on mounjaro.  She has increased her activity level and has been exercising several days a week including using the elliptical, Zumba, and upper extremity strengthening. She has not had any morning stiffness or nocturnal pain.  Patient will remain on Plaquenil as prescribed.  She is vies notify us if she develops signs or symptoms of a flare.  She will follow-up in the office in 5 months or sooner if needed.  High risk medication use  - Plaquenil 200 mg 1 tablet twice daily Monday through Friday only. CBC and CMP WNL on 12/02/22.  Orders for CBC and CMP released today.  PLQ eye exam: 03/05/2023 WNL Dr. Nile Riggs. Follow up 1 year.   - Plan: CBC with Differential/Platelet, COMPLETE METABOLIC PANEL WITH GFR  Pain in both hands - Early osteoarthritic changes were noted in the x-rays.  No joint tenderness or synovitis noted on examination today.  Lateral epicondylitis of both elbows: Not currently symptomatic.   Chronic pain of both knees: She has good ROM of both knee joints with no discomfort.  No warmth or effusion noted.  No difficulty climbing steps or rising from a seated position.  She has been working on weight loss and remains on Mounjaro.  She has increased her activity level and has been using the elliptical as well as has been doing zoom back.  Pain in both feet - BIiateral calcaneal spurs: She is not experiencing any increased discomfort in her feet at this time.  Other medical conditions are listed as follows:   BMI 40.0-44.9, adult (HCC)  History of goiter  History of gastroesophageal reflux (GERD)  Family history of sarcoidosis  Orders: Orders Placed This Encounter  Procedures   CBC with Differential/Platelet   COMPLETE METABOLIC PANEL WITH GFR   No orders of the defined types were placed in this encounter.   Follow-Up Instructions: Return in about 5 months (around 10/11/2023) for Rheumatoid arthritis.   Gearldine Bienenstock, PA-C  Note - This record has been created using Dragon software.  Chart creation errors have been sought, but may not always  have been located. Such creation errors do not reflect on  the standard of medical care.

## 2023-05-05 ENCOUNTER — Ambulatory Visit: Payer: BC Managed Care – PPO | Admitting: Physician Assistant

## 2023-05-05 DIAGNOSIS — Z832 Family history of diseases of the blood and blood-forming organs and certain disorders involving the immune mechanism: Secondary | ICD-10-CM

## 2023-05-05 DIAGNOSIS — Z8719 Personal history of other diseases of the digestive system: Secondary | ICD-10-CM

## 2023-05-05 DIAGNOSIS — Z6841 Body Mass Index (BMI) 40.0 and over, adult: Secondary | ICD-10-CM

## 2023-05-05 DIAGNOSIS — M7712 Lateral epicondylitis, left elbow: Secondary | ICD-10-CM

## 2023-05-05 DIAGNOSIS — M79642 Pain in left hand: Secondary | ICD-10-CM

## 2023-05-05 DIAGNOSIS — M79671 Pain in right foot: Secondary | ICD-10-CM

## 2023-05-05 DIAGNOSIS — G8929 Other chronic pain: Secondary | ICD-10-CM

## 2023-05-05 DIAGNOSIS — Z79899 Other long term (current) drug therapy: Secondary | ICD-10-CM

## 2023-05-05 DIAGNOSIS — M0579 Rheumatoid arthritis with rheumatoid factor of multiple sites without organ or systems involvement: Secondary | ICD-10-CM

## 2023-05-05 DIAGNOSIS — Z8639 Personal history of other endocrine, nutritional and metabolic disease: Secondary | ICD-10-CM

## 2023-05-11 ENCOUNTER — Ambulatory Visit: Payer: BC Managed Care – PPO | Attending: Physician Assistant | Admitting: Physician Assistant

## 2023-05-11 ENCOUNTER — Encounter: Payer: Self-pay | Admitting: Physician Assistant

## 2023-05-11 VITALS — BP 122/84 | HR 64 | Resp 14 | Ht 62.0 in | Wt 201.0 lb

## 2023-05-11 DIAGNOSIS — M0579 Rheumatoid arthritis with rheumatoid factor of multiple sites without organ or systems involvement: Secondary | ICD-10-CM

## 2023-05-11 DIAGNOSIS — Z6841 Body Mass Index (BMI) 40.0 and over, adult: Secondary | ICD-10-CM

## 2023-05-11 DIAGNOSIS — M79671 Pain in right foot: Secondary | ICD-10-CM

## 2023-05-11 DIAGNOSIS — M79641 Pain in right hand: Secondary | ICD-10-CM | POA: Diagnosis not present

## 2023-05-11 DIAGNOSIS — G8929 Other chronic pain: Secondary | ICD-10-CM

## 2023-05-11 DIAGNOSIS — M25561 Pain in right knee: Secondary | ICD-10-CM

## 2023-05-11 DIAGNOSIS — M25562 Pain in left knee: Secondary | ICD-10-CM

## 2023-05-11 DIAGNOSIS — Z79899 Other long term (current) drug therapy: Secondary | ICD-10-CM

## 2023-05-11 DIAGNOSIS — M79672 Pain in left foot: Secondary | ICD-10-CM

## 2023-05-11 DIAGNOSIS — Z8639 Personal history of other endocrine, nutritional and metabolic disease: Secondary | ICD-10-CM

## 2023-05-11 DIAGNOSIS — M79642 Pain in left hand: Secondary | ICD-10-CM

## 2023-05-11 DIAGNOSIS — M7711 Lateral epicondylitis, right elbow: Secondary | ICD-10-CM | POA: Diagnosis not present

## 2023-05-11 DIAGNOSIS — Z8719 Personal history of other diseases of the digestive system: Secondary | ICD-10-CM

## 2023-05-11 DIAGNOSIS — M7712 Lateral epicondylitis, left elbow: Secondary | ICD-10-CM

## 2023-05-11 DIAGNOSIS — Z832 Family history of diseases of the blood and blood-forming organs and certain disorders involving the immune mechanism: Secondary | ICD-10-CM

## 2023-05-12 LAB — COMPLETE METABOLIC PANEL WITH GFR
AG Ratio: 1.6 (calc) (ref 1.0–2.5)
ALT: 13 U/L (ref 6–29)
AST: 14 U/L (ref 10–30)
Albumin: 4.3 g/dL (ref 3.6–5.1)
Alkaline phosphatase (APISO): 48 U/L (ref 31–125)
BUN: 13 mg/dL (ref 7–25)
CO2: 29 mmol/L (ref 20–32)
Calcium: 9.5 mg/dL (ref 8.6–10.2)
Chloride: 104 mmol/L (ref 98–110)
Creat: 0.91 mg/dL (ref 0.50–0.99)
Globulin: 2.7 g/dL (ref 1.9–3.7)
Glucose, Bld: 99 mg/dL (ref 65–99)
Potassium: 4.5 mmol/L (ref 3.5–5.3)
Sodium: 142 mmol/L (ref 135–146)
Total Bilirubin: 0.3 mg/dL (ref 0.2–1.2)
Total Protein: 7 g/dL (ref 6.1–8.1)
eGFR: 81 mL/min/{1.73_m2} (ref >=60)

## 2023-05-12 LAB — CBC WITH DIFFERENTIAL/PLATELET
Absolute Monocytes: 367 cells/uL (ref 200–950)
Basophils Absolute: 31 {cells}/uL (ref 0–200)
Basophils Relative: 0.6 %
Eosinophils Absolute: 92 {cells}/uL (ref 15–500)
Eosinophils Relative: 1.8 %
HCT: 39.1 % (ref 35.0–45.0)
Hemoglobin: 12.9 g/dL (ref 11.7–15.5)
Lymphs Abs: 2004 cells/uL (ref 850–3900)
MCH: 30.6 pg (ref 27.0–33.0)
MCHC: 33 g/dL (ref 32.0–36.0)
MCV: 92.7 fL (ref 80.0–100.0)
MPV: 9.7 fL (ref 7.5–12.5)
Monocytes Relative: 7.2 %
Neutro Abs: 2606 {cells}/uL (ref 1500–7800)
Neutrophils Relative %: 51.1 %
Platelets: 329 10*3/uL (ref 140–400)
RBC: 4.22 10*6/uL (ref 3.80–5.10)
RDW: 12.6 % (ref 11.0–15.0)
Total Lymphocyte: 39.3 %
WBC: 5.1 10*3/uL (ref 3.8–10.8)

## 2023-05-12 NOTE — Progress Notes (Signed)
CBC WNL

## 2023-05-12 NOTE — Progress Notes (Signed)
CMP WNL

## 2023-06-08 DIAGNOSIS — Z23 Encounter for immunization: Secondary | ICD-10-CM | POA: Diagnosis not present

## 2023-06-08 DIAGNOSIS — E1169 Type 2 diabetes mellitus with other specified complication: Secondary | ICD-10-CM | POA: Diagnosis not present

## 2023-06-08 DIAGNOSIS — I10 Essential (primary) hypertension: Secondary | ICD-10-CM | POA: Diagnosis not present

## 2023-06-08 DIAGNOSIS — M069 Rheumatoid arthritis, unspecified: Secondary | ICD-10-CM | POA: Diagnosis not present

## 2023-06-16 ENCOUNTER — Other Ambulatory Visit: Payer: Self-pay | Admitting: Obstetrics and Gynecology

## 2023-06-16 DIAGNOSIS — Z1231 Encounter for screening mammogram for malignant neoplasm of breast: Secondary | ICD-10-CM

## 2023-06-24 ENCOUNTER — Ambulatory Visit
Admission: RE | Admit: 2023-06-24 | Discharge: 2023-06-24 | Disposition: A | Discharge status: Home / Self Care | Payer: BC Managed Care – PPO | Source: Ambulatory Visit | Attending: Obstetrics and Gynecology | Admitting: Obstetrics and Gynecology

## 2023-06-24 DIAGNOSIS — Z1231 Encounter for screening mammogram for malignant neoplasm of breast: Secondary | ICD-10-CM | POA: Diagnosis not present

## 2023-07-04 ENCOUNTER — Other Ambulatory Visit: Payer: Self-pay | Admitting: Physician Assistant

## 2023-07-04 DIAGNOSIS — M0579 Rheumatoid arthritis with rheumatoid factor of multiple sites without organ or systems involvement: Secondary | ICD-10-CM

## 2023-07-06 NOTE — Telephone Encounter (Signed)
Last Fill: 12/02/2022  Eye exam: 03/05/2023 WNL    Labs: 05/11/2023 CBC WNL CMP WNL   Next Visit: 10/12/2022  Last Visit: 05/11/2023  [id number] rheumatoid arthritis of multiple sites   Current Dose per office note 05/11/2023: Plaquenil 200 mg 1 tablet twice daily Monday through Friday only.   Okay to refill Plaquenil?

## 2023-09-29 NOTE — Progress Notes (Deleted)
 Office Visit Note  Patient: Marie [name]             Date of Birth: Jun 25, 1981           MRN: [id number]             PCP: Georgann Housekeeper, MD Referring: Georgann Housekeeper, MD Visit Date: 10/13/2023 Occupation: @GUAROCC @  Subjective:  No chief complaint on file.   History of Present Illness: Marie [name] is a 43 y.o. female ***     Activities of Daily Living:  Patient reports morning stiffness for *** {minute/hour:19697}.   Patient {ACTIONS;DENIES/REPORTS:21021675::"Denies"} nocturnal pain.  Difficulty dressing/grooming: {ACTIONS;DENIES/REPORTS:21021675::"Denies"} Difficulty climbing stairs: {ACTIONS;DENIES/REPORTS:21021675::"Denies"} Difficulty getting out of chair: {ACTIONS;DENIES/REPORTS:21021675::"Denies"} Difficulty using hands for taps, buttons, cutlery, and/or writing: {ACTIONS;DENIES/REPORTS:21021675::"Denies"}  No Rheumatology ROS completed.   PMFS History:  Patient Active Problem List   Diagnosis Date Noted   Seropositive rheumatoid arthritis of multiple sites (HCC) 11/16/2016   History of goiter 11/16/2016   Family history of sarcoidosis 11/16/2016   History of gastroesophageal reflux (GERD) 11/03/2016   Abnormal serum angiotensin-converting enzyme level,has had positive rheumatoid factor, and anti-DNAse B in the past.   11/03/2016   High serum angiotensin converting enzyme (ACE) 11/03/2016   Goiter 08/05/2016   Gastroesophageal reflux disease without esophagitis 08/05/2016    Past Medical History:  Diagnosis Date   Enlarged thyroid    no current med.   GERD (gastroesophageal reflux disease)    Hypertension    states under control with meds., has been on med. since 03/2017   Macromastia 06/2017   Rheumatoid arthritis (HCC)    Sleep apnea    no CPAP use but did bring CPAP machine with her   Vertigo     Family History  Problem Relation Age of Onset   Hypertension Mother    Arthritis Mother    Sarcoidosis Mother    Diabetes Other    Diabetes  Maternal Grandmother    Congestive Heart Failure Maternal Grandmother    Cancer Maternal Grandfather    Past Surgical History:  Procedure Laterality Date   BREAST BIOPSY Left 07/18/2022   BREAST REDUCTION SURGERY Bilateral 07/06/2017   Procedure: MAMMARY REDUCTION  (BREAST);  Surgeon: Louisa Second, MD;  Location: [facility];  Service: Plastics;  Laterality: Bilateral;   CHROMOPERTUBATION  05/30/2011   HYSTEROSCOPY DIAGNOSTIC  05/30/2011   LAPAROSCOPIC LYSIS OF ADHESIONS  05/30/2011   LAPAROSCOPIC UNILATERAL SALPINGECTOMY Right 09/19/2010   REDUCTION MAMMAPLASTY Bilateral 2018   Social History   Social History Narrative   Not on file   Immunization History  Administered Date(s) Administered   PFIZER(Purple Top)SARS-COV-2 Vaccination 11/19/2019, 12/10/2019, 08/10/2020     Objective: Vital Signs: There were no vitals taken for this visit.   Physical Exam   Musculoskeletal Exam: ***  CDAI Exam: CDAI Score: -- Patient Global: --; Provider Global: -- Swollen: --; Tender: -- Joint Exam 10/13/2023   No joint exam has been documented for this visit   There is currently no information documented on the homunculus. Go to the Rheumatology activity and complete the homunculus joint exam.  Investigation: No additional findings.  Imaging: No results found.  Recent Labs: Lab Results  Component Value Date   WBC 5.1 05/11/2023   HGB 12.9 05/11/2023   PLT 329 05/11/2023   NA 142 05/11/2023   K 4.5 05/11/2023   CL 104 05/11/2023   CO2 29 05/11/2023   GLUCOSE 99 05/11/2023   BUN 13 05/11/2023   CREATININE 0.91  05/11/2023   BILITOT 0.3 05/11/2023   ALKPHOS 72 04/16/2017   AST 14 05/11/2023   ALT 13 05/11/2023   PROT 7.0 05/11/2023   ALBUMIN 4.4 04/16/2017   CALCIUM 9.5 05/11/2023   GFRAA 127 12/26/2020    Speciality Comments: PLQ eye exam: 03/05/2023 WNL Dr. Nile Riggs. Follow up 1 year.  Procedures:  No procedures performed Allergies: Iodine, Red  dye #40 (allura red), Shellfish allergy, Latex, and Sulfa antibiotics   Assessment / Plan:     Visit Diagnoses: Seropositive rheumatoid arthritis of multiple sites (HCC)  High risk medication use  Pain in both hands  Lateral epicondylitis of both elbows  Chronic pain of both knees  Pain in both feet  History of gastroesophageal reflux (GERD)  History of goiter  BMI 40.0-44.9, adult (HCC)  Family history of sarcoidosis  Orders: No orders of the defined types were placed in this encounter.  No orders of the defined types were placed in this encounter.   Face-to-face time spent with patient was *** minutes. Greater than 50% of time was spent in counseling and coordination of care.  Follow-Up Instructions: No follow-ups on file.   Pollyann Savoy, MD  Note - This record has been created using Animal nutritionist.  Chart creation errors have been sought, but may not always  have been located. Such creation errors do not reflect on  the standard of medical care.

## 2023-10-04 ENCOUNTER — Other Ambulatory Visit: Payer: Self-pay | Admitting: Physician Assistant

## 2023-10-04 DIAGNOSIS — M0579 Rheumatoid arthritis with rheumatoid factor of multiple sites without organ or systems involvement: Secondary | ICD-10-CM

## 2023-10-05 NOTE — Telephone Encounter (Signed)
 Last Fill: 07/06/2023  Eye exam:  03/05/2023 WNL   Labs: 05/11/2023 CBC WNL CMP WNL   Next Visit: 10/21/2023  Last Visit: 05/11/2023  IK:[id number] rheumatoid arthritis of multiple sites   Current Dose per office note 05/11/2023: Plaquenil  200 mg 1 tablet twice daily Monday through Friday only.   Patient to update labs at upcoming appointment on 10/21/2023.   Okay to refill Plaquenil ?

## 2023-10-07 NOTE — Progress Notes (Unsigned)
Office Visit Note  Patient: Marie [name]             Date of Birth: 05/24/1981           MRN: [id number]             PCP: Georgann Housekeeper, MD Referring: Georgann Housekeeper, MD Visit Date: 10/21/2023 Occupation: @GUAROCC @  Subjective:  Medication monitoring   History of Present Illness: ETNA FORQUER is a 43 y.o. female with history of seropositive rheumatoid arthritis.  Patient remains on Plaquenil 200 mg 1 tablet twice daily Monday through Friday only.  She is tolerating Plaquenil without any side effects and has not had any recent gaps in therapy.  She denies any signs or symptoms of a rheumatoid arthritis flare.  She denies any morning stiffness, nocturnal pain, or difficulty with ADLs.  She has had some increased arthralgias in her elbows and hips especially after sitting for prolonged periods of time working at her computer.  For work she has been having to work long hours which has caused some increased stiffness as well as some increased eye dryness from eyestrain.  Patient states that she has not been taking any over-the-counter products for pain relief.  She denies any joint swelling at this time.  She denies any recent or recurrent infections.  She denies any new medical conditions. Patient requested to have FMLA paperwork completed.    Activities of Daily Living:  Patient reports morning stiffness for 0 minutes.   Patient Denies nocturnal pain.  Difficulty dressing/grooming: Denies Difficulty climbing stairs: Denies Difficulty getting out of chair: Denies Difficulty using hands for taps, buttons, cutlery, and/or writing: Denies  Review of Systems  Constitutional:  Negative for fatigue.  HENT:  Negative for mouth sores, mouth dryness and nose dryness.   Eyes:  Positive for visual disturbance and dryness. Negative for pain and redness.  Respiratory:  Negative for shortness of breath and difficulty breathing.   Cardiovascular:  Negative for chest pain and palpitations.   Gastrointestinal:  Negative for blood in stool, constipation and diarrhea.  Endocrine: Negative for increased urination.  Genitourinary:  Negative for involuntary urination.  Musculoskeletal:  Negative for joint pain, gait problem, joint pain, joint swelling, myalgias, muscle weakness, morning stiffness, muscle tenderness and myalgias.  Skin:  Negative for color change, rash, hair loss and sensitivity to sunlight.  Allergic/Immunologic: Negative for susceptible to infections.  Neurological:  Negative for dizziness and headaches.  Hematological:  Negative for swollen glands.  Psychiatric/Behavioral:  Negative for depressed mood and sleep disturbance. The patient is not nervous/anxious.     PMFS History:  Patient Active Problem List   Diagnosis Date Noted   Seropositive rheumatoid arthritis of multiple sites (HCC) 11/16/2016   History of goiter 11/16/2016   Family history of sarcoidosis 11/16/2016   History of gastroesophageal reflux (GERD) 11/03/2016   Abnormal serum angiotensin-converting enzyme level,has had positive rheumatoid factor, and anti-DNAse B in the past.   11/03/2016   High serum angiotensin converting enzyme (ACE) 11/03/2016   Goiter 08/05/2016   Gastroesophageal reflux disease without esophagitis 08/05/2016    Past Medical History:  Diagnosis Date   Enlarged thyroid    no current med.   GERD (gastroesophageal reflux disease)    Hypertension    states under control with meds., has been on med. since 03/2017   Macromastia 06/2017   Rheumatoid arthritis (HCC)    Sleep apnea    no CPAP use but did bring CPAP machine with her  Vertigo     Family History  Problem Relation Age of Onset   Hypertension Mother    Arthritis Mother    Sarcoidosis Mother    Diabetes Other    Diabetes Maternal Grandmother    Congestive Heart Failure Maternal Grandmother    Cancer Maternal Grandfather    Past Surgical History:  Procedure Laterality Date   BREAST BIOPSY Left 07/18/2022    BREAST REDUCTION SURGERY Bilateral 07/06/2017   Procedure: MAMMARY REDUCTION  (BREAST);  Surgeon: Louisa Second, MD;  Location: [facility];  Service: Plastics;  Laterality: Bilateral;   CHROMOPERTUBATION  05/30/2011   HYSTEROSCOPY DIAGNOSTIC  05/30/2011   LAPAROSCOPIC LYSIS OF ADHESIONS  05/30/2011   LAPAROSCOPIC UNILATERAL SALPINGECTOMY Right 09/19/2010   REDUCTION MAMMAPLASTY Bilateral 2018   Social History   Social History Narrative   Not on file   Immunization History  Administered Date(s) Administered   PFIZER(Purple Top)SARS-COV-2 Vaccination 11/19/2019, 12/10/2019, 08/10/2020     Objective: Vital Signs: BP 133/86 (BP Location: Left Arm, Patient Position: Sitting, Cuff Size: Normal)   Pulse 76   Resp 16   Ht 5\' 2"  (1.575 m)   Wt 201 lb 9.6 oz (91.4 kg)   BMI 36.87 kg/m    Physical Exam Vitals and nursing note reviewed.  Constitutional:      Appearance: She is well-developed.  HENT:     Head: Normocephalic and atraumatic.  Eyes:     Conjunctiva/sclera: Conjunctivae normal.  Cardiovascular:     Rate and Rhythm: Normal rate and regular rhythm.     Heart sounds: Normal heart sounds.  Pulmonary:     Effort: Pulmonary effort is normal.     Breath sounds: Normal breath sounds.  Abdominal:     General: Bowel sounds are normal.     Palpations: Abdomen is soft.  Musculoskeletal:     Cervical back: Normal range of motion.  Lymphadenopathy:     Cervical: No cervical adenopathy.  Skin:    General: Skin is warm and dry.     Capillary Refill: Capillary refill takes less than 2 seconds.  Neurological:     Mental Status: She is alert and oriented to person, place, and time.  Psychiatric:        Behavior: Behavior normal.      Musculoskeletal Exam: C-spine, thoracic spine, lumbar spine and good range of motion.  Shoulder joints, elbow joints, wrist joints, MCPs, PIPs, DIPs have good range of motion with no synovitis.  Complete fist formation  bilaterally.  Hip joints have good range of motion with no groin pain.  Knee joints have good range of motion no warmth or effusion.  Ankle joints have good range of motion with no tenderness or joint swelling.  CDAI Exam: CDAI Score: -- Patient Global: 30 / 100; Provider Global: 30 / 100 Swollen: --; Tender: -- Joint Exam 10/21/2023   No joint exam has been documented for this visit   There is currently no information documented on the homunculus. Go to the Rheumatology activity and complete the homunculus joint exam.  Investigation: No additional findings.  Imaging: No results found.  Recent Labs: Lab Results  Component Value Date   WBC 5.1 05/11/2023   HGB 12.9 05/11/2023   PLT 329 05/11/2023   NA 142 05/11/2023   K 4.5 05/11/2023   CL 104 05/11/2023   CO2 29 05/11/2023   GLUCOSE 99 05/11/2023   BUN 13 05/11/2023   CREATININE 0.91 05/11/2023   BILITOT 0.3 05/11/2023   ALKPHOS  72 04/16/2017   AST 14 05/11/2023   ALT 13 05/11/2023   PROT 7.0 05/11/2023   ALBUMIN 4.4 04/16/2017   CALCIUM 9.5 05/11/2023   GFRAA 127 12/26/2020    Speciality Comments: PLQ eye exam: 03/05/2023 WNL Dr. Nile Riggs. Follow up 1 year.  Procedures:  No procedures performed Allergies: Iodine, Red dye #40 (allura red), Shellfish allergy, Latex, and Sulfa antibiotics   Assessment / Plan:     Visit Diagnoses: Seropositive rheumatoid arthritis of multiple sites (HCC) - RF negative, anti-CCP positive, positive synovitis on ultrasound: She has no synovitis on examination today.  She has not had any signs or symptoms of a rheumatoid arthritis flare.  She has clinically been doing well taking Plaquenil 200 mg 1 tablet by mouth twice daily Monday through Friday.  She continues to tolerate Plaquenil without any side effects and has not had any recent gaps in therapy.  She has not had any morning stiffness, nocturnal pain, or difficulty with ADLs.  Patient remain on Plaquenil as monotherapy.  She was advised  to notify us if she develops signs or symptoms of a flare.  She will follow-up in the office in 5 months or sooner if needed.  Plan to fill out updated [employer] paperwork.    High risk medication use - Plaquenil 200 mg 1 tablet twice daily Monday through Friday only.  PLQ eye exam: 03/05/2023 WNL Dr. Nile Riggs. Follow up 1 year.  Patient plans on establishing care at [facility]. CBC and CMP were drawn on 05/11/2023.  Orders for CBC and CMP were released today. - Plan: CBC with Differential/Platelet, COMPLETE METABOLIC PANEL WITH GFR  Pain in both hands: No synovitis noted on examination today.  She has not been experiencing any morning stiffness, nocturnal pain, or difficulty with ADLs.  Lateral epicondylitis of both elbows: Patient experiences intermittent stiffness in both elbows particularly if working at her computer for prolonged periods of time for work.  On examination today she had no tenderness upon palpation.  No active inflammation noted.  Chronic pain of both knees: She has good range of motion of both knee joints on examination today.  No warmth or effusion noted.  Pain in both feet: No ankle synovitis noted.  No tenderness or synovitis over MTP joints.  Other medical conditions are listed as follows:  History of goiter  History of gastroesophageal reflux (GERD)  Family history of sarcoidosis  Orders: Orders Placed This Encounter  Procedures   CBC with Differential/Platelet   COMPLETE METABOLIC PANEL WITH GFR   No orders of the defined types were placed in this encounter.     Follow-Up Instructions: Return in about 5 months (around 03/20/2024) for Rheumatoid arthritis.   Gearldine Bienenstock, PA-C  Note - This record has been created using Dragon software.  Chart creation errors have been sought, but may not always  have been located. Such creation errors do not reflect on  the standard of medical care.

## 2023-10-13 ENCOUNTER — Ambulatory Visit: Payer: BC Managed Care – PPO | Admitting: Rheumatology

## 2023-10-13 DIAGNOSIS — Z8639 Personal history of other endocrine, nutritional and metabolic disease: Secondary | ICD-10-CM

## 2023-10-13 DIAGNOSIS — M79672 Pain in left foot: Secondary | ICD-10-CM

## 2023-10-13 DIAGNOSIS — Z79899 Other long term (current) drug therapy: Secondary | ICD-10-CM

## 2023-10-13 DIAGNOSIS — G8929 Other chronic pain: Secondary | ICD-10-CM

## 2023-10-13 DIAGNOSIS — Z832 Family history of diseases of the blood and blood-forming organs and certain disorders involving the immune mechanism: Secondary | ICD-10-CM

## 2023-10-13 DIAGNOSIS — M79641 Pain in right hand: Secondary | ICD-10-CM

## 2023-10-13 DIAGNOSIS — M0579 Rheumatoid arthritis with rheumatoid factor of multiple sites without organ or systems involvement: Secondary | ICD-10-CM

## 2023-10-13 DIAGNOSIS — Z6841 Body Mass Index (BMI) 40.0 and over, adult: Secondary | ICD-10-CM

## 2023-10-13 DIAGNOSIS — Z8719 Personal history of other diseases of the digestive system: Secondary | ICD-10-CM

## 2023-10-21 ENCOUNTER — Ambulatory Visit: Payer: BC Managed Care – PPO | Attending: Physician Assistant | Admitting: Physician Assistant

## 2023-10-21 ENCOUNTER — Encounter: Payer: Self-pay | Admitting: Physician Assistant

## 2023-10-21 VITALS — BP 133/86 | HR 76 | Resp 16 | Ht 62.0 in | Wt 201.6 lb

## 2023-10-21 DIAGNOSIS — M79671 Pain in right foot: Secondary | ICD-10-CM

## 2023-10-21 DIAGNOSIS — Z832 Family history of diseases of the blood and blood-forming organs and certain disorders involving the immune mechanism: Secondary | ICD-10-CM

## 2023-10-21 DIAGNOSIS — M0579 Rheumatoid arthritis with rheumatoid factor of multiple sites without organ or systems involvement: Secondary | ICD-10-CM

## 2023-10-21 DIAGNOSIS — M7711 Lateral epicondylitis, right elbow: Secondary | ICD-10-CM

## 2023-10-21 DIAGNOSIS — Z8639 Personal history of other endocrine, nutritional and metabolic disease: Secondary | ICD-10-CM

## 2023-10-21 DIAGNOSIS — M79642 Pain in left hand: Secondary | ICD-10-CM

## 2023-10-21 DIAGNOSIS — Z79899 Other long term (current) drug therapy: Secondary | ICD-10-CM | POA: Diagnosis not present

## 2023-10-21 DIAGNOSIS — Z8719 Personal history of other diseases of the digestive system: Secondary | ICD-10-CM

## 2023-10-21 DIAGNOSIS — M79641 Pain in right hand: Secondary | ICD-10-CM

## 2023-10-21 DIAGNOSIS — Z6841 Body Mass Index (BMI) 40.0 and over, adult: Secondary | ICD-10-CM

## 2023-10-21 DIAGNOSIS — M79672 Pain in left foot: Secondary | ICD-10-CM

## 2023-10-21 DIAGNOSIS — M7712 Lateral epicondylitis, left elbow: Secondary | ICD-10-CM

## 2023-10-21 DIAGNOSIS — M25562 Pain in left knee: Secondary | ICD-10-CM

## 2023-10-21 DIAGNOSIS — G8929 Other chronic pain: Secondary | ICD-10-CM

## 2023-10-21 DIAGNOSIS — M25561 Pain in right knee: Secondary | ICD-10-CM

## 2023-10-22 LAB — CBC WITH DIFFERENTIAL/PLATELET
Absolute Lymphocytes: 2508 {cells}/uL (ref 850–3900)
Absolute Monocytes: 391 {cells}/uL (ref 200–950)
Basophils Absolute: 28 {cells}/uL (ref 0–200)
Basophils Relative: 0.5 %
Eosinophils Absolute: 88 {cells}/uL (ref 15–500)
Eosinophils Relative: 1.6 %
HCT: 42.5 % (ref 35.0–45.0)
Hemoglobin: 14.4 g/dL (ref 11.7–15.5)
MCH: 30.5 pg (ref 27.0–33.0)
MCHC: 33.9 g/dL (ref 32.0–36.0)
MCV: 90 fL (ref 80.0–100.0)
MPV: 10 fL (ref 7.5–12.5)
Monocytes Relative: 7.1 %
Neutro Abs: 2486 {cells}/uL (ref 1500–7800)
Neutrophils Relative %: 45.2 %
Platelets: 347 10*3/uL (ref 140–400)
RBC: 4.72 10*6/uL (ref 3.80–5.10)
RDW: 13 % (ref 11.0–15.0)
Total Lymphocyte: 45.6 %
WBC: 5.5 10*3/uL (ref 3.8–10.8)

## 2023-10-22 LAB — COMPLETE METABOLIC PANEL WITH GFR
AG Ratio: 1.6 (calc) (ref 1.0–2.5)
ALT: 18 U/L (ref 6–29)
AST: 13 U/L (ref 10–30)
Albumin: 4.6 g/dL (ref 3.6–5.1)
Alkaline phosphatase (APISO): 54 U/L (ref 31–125)
BUN: 11 mg/dL (ref 7–25)
CO2: 33 mmol/L — ABNORMAL HIGH (ref 20–32)
Calcium: 10.1 mg/dL (ref 8.6–10.2)
Chloride: 99 mmol/L (ref 98–110)
Creat: 0.89 mg/dL (ref 0.50–0.99)
Globulin: 2.9 g/dL (ref 1.9–3.7)
Glucose, Bld: 78 mg/dL (ref 65–99)
Potassium: 3.8 mmol/L (ref 3.5–5.3)
Sodium: 141 mmol/L (ref 135–146)
Total Bilirubin: 0.4 mg/dL (ref 0.2–1.2)
Total Protein: 7.5 g/dL (ref 6.1–8.1)
eGFR: 83 mL/min/{1.73_m2} (ref >=60)

## 2023-10-22 NOTE — Progress Notes (Signed)
CBC and CMP WNL

## 2024-03-03 NOTE — Progress Notes (Signed)
 Office Visit Note  Patient: Marie [name]             Date of Birth: 1981-07-21           MRN: [id number]             PCP: Ransom Other, MD Referring: Ransom Other, MD Visit Date: 03/17/2024 Occupation: @GUAROCC @  Subjective:  Medication monitoring  History of Present Illness: LEASIA SWANN is a 43 y.o. female with history of seropositive rheumatoid arthritis. Patient remains on  Plaquenil  200 mg 1 tablet twice daily Monday through Friday only.  She is tolerating Plaquenil  without any side effects and has not had any recent gaps in therapy.  She denies any morning stiffness, nocturnal pain, or difficulty performing ADLs.  She has not had any signs or symptoms of a flare.  She denies any joint pain, joint swelling at this time.  She denies any fatigue.  She denies any recent or recurrent infections.   Activities of Daily Living:  Patient denies morning stiffness  Patient Denies nocturnal pain.  Difficulty dressing/grooming: Denies Difficulty climbing stairs: Denies Difficulty getting out of chair: Denies Difficulty using hands for taps, buttons, cutlery, and/or writing: Denies  Review of Systems  Constitutional:  Negative for fatigue.  HENT:  Negative for mouth sores and mouth dryness.   Eyes:  Negative for dryness.  Respiratory:  Negative for shortness of breath.   Cardiovascular:  Negative for chest pain and palpitations.  Gastrointestinal:  Negative for blood in stool, constipation and diarrhea.  Endocrine: Negative for increased urination.  Genitourinary:  Negative for involuntary urination.  Musculoskeletal:  Negative for joint pain, gait problem, joint pain, joint swelling, myalgias, muscle weakness, morning stiffness, muscle tenderness and myalgias.  Skin:  Negative for color change, rash, hair loss and sensitivity to sunlight.  Allergic/Immunologic: Negative for susceptible to infections.  Neurological:  Negative for dizziness and headaches.  Hematological:   Negative for swollen glands.  Psychiatric/Behavioral:  Negative for depressed mood and sleep disturbance. The patient is not nervous/anxious.     PMFS History:  Patient Active Problem List   Diagnosis Date Noted   Seropositive rheumatoid arthritis of multiple sites (HCC) 11/16/2016   History of goiter 11/16/2016   Family history of sarcoidosis 11/16/2016   History of gastroesophageal reflux (GERD) 11/03/2016   Abnormal serum angiotensin-converting enzyme level,has had positive rheumatoid factor, and anti-DNAse B in the past.   11/03/2016   High serum angiotensin converting enzyme (ACE) 11/03/2016   Goiter 08/05/2016   Gastroesophageal reflux disease without esophagitis 08/05/2016    Past Medical History:  Diagnosis Date   Enlarged thyroid     no current med.   GERD (gastroesophageal reflux disease)    Hypertension    states under control with meds., has been on med. since 03/2017   Macromastia 06/2017   Rheumatoid arthritis (HCC)    Sleep apnea    no CPAP use but did bring CPAP machine with her   Vertigo     Family History  Problem Relation Age of Onset   Hypertension Mother    Arthritis Mother    Sarcoidosis Mother    Diabetes Other    Diabetes Maternal Grandmother    Congestive Heart Failure Maternal Grandmother    Cancer Maternal Grandfather    Past Surgical History:  Procedure Laterality Date   BREAST BIOPSY Left 07/18/2022   BREAST REDUCTION SURGERY Bilateral 07/06/2017   Procedure: MAMMARY REDUCTION  (BREAST);  Surgeon: Marcus Lung, MD;  Location: [facility];  Service: Plastics;  Laterality: Bilateral;   CHROMOPERTUBATION  05/30/2011   HYSTEROSCOPY DIAGNOSTIC  05/30/2011   LAPAROSCOPIC LYSIS OF ADHESIONS  05/30/2011   LAPAROSCOPIC UNILATERAL SALPINGECTOMY Right 09/19/2010   REDUCTION MAMMAPLASTY Bilateral 2018   Social History   Social History Narrative   Not on file   Immunization History  Administered Date(s) Administered    PFIZER(Purple Top)SARS-COV-2 Vaccination 11/19/2019, 12/10/2019, 08/10/2020     Objective: Vital Signs: BP 128/86 (BP Location: Left Arm, Patient Position: Sitting, Cuff Size: Normal)   Pulse 80   Resp 16   Ht 5' 2 (1.575 m)   Wt 198 lb 9.6 oz (90.1 kg)   BMI 36.32 kg/m    Physical Exam Vitals and nursing note reviewed.  Constitutional:      Appearance: She is well-developed.  HENT:     Head: Normocephalic and atraumatic.   Eyes:     Conjunctiva/sclera: Conjunctivae normal.    Cardiovascular:     Rate and Rhythm: Normal rate and regular rhythm.     Heart sounds: Normal heart sounds.  Pulmonary:     Effort: Pulmonary effort is normal.     Breath sounds: Normal breath sounds.  Abdominal:     General: Bowel sounds are normal.     Palpations: Abdomen is soft.   Musculoskeletal:     Cervical back: Normal range of motion.  Lymphadenopathy:     Cervical: No cervical adenopathy.   Skin:    General: Skin is warm and dry.     Capillary Refill: Capillary refill takes less than 2 seconds.   Neurological:     Mental Status: She is alert and oriented to person, place, and time.   Psychiatric:        Behavior: Behavior normal.      Musculoskeletal Exam: C-spine, thoracic spine, lumbar spine good range of motion.  Shoulder joints, elbow joints, wrist joints, MCPs, PIPs, DIPs have good range of motion with no synovitis.  Complete fist formation bilaterally.  Hip joints have good range of motion with no groin pain.  Knee joints have good range of motion no warmth or effusion.  Ankle joints have good range of motion with no tenderness or joint swelling.  No tenderness or synovitis over MTP joints.  CDAI Exam: CDAI Score: -- Patient Global: --; Provider Global: -- Swollen: --; Tender: -- Joint Exam 03/17/2024   No joint exam has been documented for this visit   There is currently no information documented on the homunculus. Go to the Rheumatology activity and complete the  homunculus joint exam.  Investigation: No additional findings.  Imaging: No results found.  Recent Labs: Lab Results  Component Value Date   WBC 5.5 10/21/2023   HGB 14.4 10/21/2023   PLT 347 10/21/2023   NA 141 10/21/2023   K 3.8 10/21/2023   CL 99 10/21/2023   CO2 33 (H) 10/21/2023   GLUCOSE 78 10/21/2023   BUN 11 10/21/2023   CREATININE 0.89 10/21/2023   BILITOT 0.4 10/21/2023   ALKPHOS 72 04/16/2017   AST 13 10/21/2023   ALT 18 10/21/2023   PROT 7.5 10/21/2023   ALBUMIN 4.4 04/16/2017   CALCIUM 10.1 10/21/2023   GFRAA 127 12/26/2020    Speciality Comments: PLQ eye exam: 03/05/2023 WNL Dr. Roz. Follow up 1 year.  Procedures:  No procedures performed Allergies: Iodine, Red dye #40 (allura red), Shellfish allergy , Latex, and Sulfa antibiotics   Assessment / Plan:     Visit Diagnoses: Seropositive  rheumatoid arthritis of multiple sites (HCC) - RF negative, anti-CCP positive, positive synovitis on ultrasound: She has no joint tenderness or synovitis on examination today.  She has not had any signs or symptoms of a rheumatoid arthritis flare.  She has clinically been doing well taking Plaquenil  200 mg 1 tablet by mouth twice daily Monday through Friday.  She is tolerating Plaquenil  without any side effects and has not had any recent gaps in therapy.  She has not been experiencing any morning stiffness, nocturnal pain, or difficulty performing ADLs.  She will remain on Plaquenil  as monotherapy.  She was advised to notify us  if she develops any signs or symptoms of a flare.  She will follow-up in the office in 5 months or sooner if needed.  High risk medication use - Plaquenil  200 mg 1 tablet twice daily Monday through Friday only.  PLQ eye exam: 03/05/2023 WNL Dr. Roz. Follow up 1 year. Scheduled with a new ophthalmology practice in August 2025.  She was given a new Plaquenil  eye examination form to take with her to her next appointment. Lipid panel updated on 02/22/24 CBC  and CMP Updated on 02/22/24.  She will continue to require updated lab work every 5 months.  Pain in both hands: She has no joint tenderness or synovitis on examination today.  She is able to make a complete fist bilaterally.  She has not been experiencing any morning stiffness, nocturnal pain, or difficulty performing ADLs.  Lateral epicondylitis of both elbows: Not currently symptomatic.  Chronic pain of both knees: She has good range of motion of both knee joints on examination today.  No warmth or effusion noted.  Pain in both feet: She has good range of motion of both ankle joints with no tenderness or joint swelling.  No tenderness or synovitis over MTP joints.  Other medical conditions are listed as follows:  History of goiter  History of gastroesophageal reflux (GERD)  Family history of sarcoidosis  Orders: No orders of the defined types were placed in this encounter.  No orders of the defined types were placed in this encounter.   Follow-Up Instructions: Return in about 5 months (around 08/17/2024) for Rheumatoid arthritis.   Waddell CHRISTELLA Craze, PA-C  Note - This record has been created using Dragon software.  Chart creation errors have been sought, but may not always  have been located. Such creation errors do not reflect on  the standard of medical care.

## 2024-03-17 ENCOUNTER — Ambulatory Visit: Payer: BC Managed Care – PPO | Attending: Physician Assistant | Admitting: Physician Assistant

## 2024-03-17 ENCOUNTER — Encounter: Payer: Self-pay | Admitting: Physician Assistant

## 2024-03-17 VITALS — BP 128/86 | HR 80 | Resp 16 | Ht 62.0 in | Wt 198.6 lb

## 2024-03-17 DIAGNOSIS — M0579 Rheumatoid arthritis with rheumatoid factor of multiple sites without organ or systems involvement: Secondary | ICD-10-CM | POA: Diagnosis not present

## 2024-03-17 DIAGNOSIS — M79641 Pain in right hand: Secondary | ICD-10-CM | POA: Diagnosis not present

## 2024-03-17 DIAGNOSIS — M7711 Lateral epicondylitis, right elbow: Secondary | ICD-10-CM | POA: Diagnosis not present

## 2024-03-17 DIAGNOSIS — G8929 Other chronic pain: Secondary | ICD-10-CM

## 2024-03-17 DIAGNOSIS — M79642 Pain in left hand: Secondary | ICD-10-CM

## 2024-03-17 DIAGNOSIS — Z79899 Other long term (current) drug therapy: Secondary | ICD-10-CM | POA: Diagnosis not present

## 2024-03-17 DIAGNOSIS — Z8719 Personal history of other diseases of the digestive system: Secondary | ICD-10-CM

## 2024-03-17 DIAGNOSIS — M25561 Pain in right knee: Secondary | ICD-10-CM

## 2024-03-17 DIAGNOSIS — M7712 Lateral epicondylitis, left elbow: Secondary | ICD-10-CM

## 2024-03-17 DIAGNOSIS — M25562 Pain in left knee: Secondary | ICD-10-CM

## 2024-03-17 DIAGNOSIS — Z832 Family history of diseases of the blood and blood-forming organs and certain disorders involving the immune mechanism: Secondary | ICD-10-CM

## 2024-03-17 DIAGNOSIS — M79671 Pain in right foot: Secondary | ICD-10-CM

## 2024-03-17 DIAGNOSIS — M79672 Pain in left foot: Secondary | ICD-10-CM

## 2024-03-17 DIAGNOSIS — Z8639 Personal history of other endocrine, nutritional and metabolic disease: Secondary | ICD-10-CM

## 2024-06-07 ENCOUNTER — Other Ambulatory Visit: Payer: Self-pay | Admitting: Internal Medicine

## 2024-06-07 DIAGNOSIS — Z1231 Encounter for screening mammogram for malignant neoplasm of breast: Secondary | ICD-10-CM

## 2024-06-19 ENCOUNTER — Other Ambulatory Visit: Payer: Self-pay | Admitting: Rheumatology

## 2024-06-19 DIAGNOSIS — M0579 Rheumatoid arthritis with rheumatoid factor of multiple sites without organ or systems involvement: Secondary | ICD-10-CM

## 2024-06-20 ENCOUNTER — Other Ambulatory Visit: Payer: Self-pay

## 2024-06-20 DIAGNOSIS — Z79899 Other long term (current) drug therapy: Secondary | ICD-10-CM

## 2024-06-20 NOTE — Telephone Encounter (Signed)
 Last Fill: 10/05/2023  Eye exam: 05/04/2024   Labs: 10/21/2023  CBC and CMP WNL   Next Visit: 08/17/2024  Last Visit: 03/17/2024  IK:Dzmnendpupcz rheumatoid arthritis of multiple sites   Current Dose per office note on 03/17/2024: Plaquenil  200 mg 1 tablet twice daily Monday through Friday only.   Advised patient she is due to update labs. Patient will go to [facility] to update labs, orders have been placed and released.   Okay to refill Plaquenil ?

## 2024-06-28 ENCOUNTER — Ambulatory Visit

## 2024-07-04 ENCOUNTER — Ambulatory Visit
Admission: RE | Admit: 2024-07-04 | Discharge: 2024-07-04 | Disposition: A | Discharge status: Home / Self Care | Source: Ambulatory Visit | Attending: Internal Medicine | Admitting: Internal Medicine

## 2024-07-04 DIAGNOSIS — Z1231 Encounter for screening mammogram for malignant neoplasm of breast: Secondary | ICD-10-CM

## 2024-07-05 ENCOUNTER — Ambulatory Visit: Payer: Self-pay | Admitting: Rheumatology

## 2024-07-05 LAB — COMPREHENSIVE METABOLIC PANEL WITH GFR
AG Ratio: 1.6 (calc) (ref 1.0–2.5)
ALT: 12 U/L (ref 6–29)
AST: 12 U/L (ref 10–30)
Albumin: 4.3 g/dL (ref 3.6–5.1)
Alkaline phosphatase (APISO): 48 U/L (ref 31–125)
BUN: 9 mg/dL (ref 7–25)
CO2: 30 mmol/L (ref 20–32)
Calcium: 9.6 mg/dL (ref 8.6–10.2)
Chloride: 102 mmol/L (ref 98–110)
Creat: 0.98 mg/dL (ref 0.50–0.99)
Globulin: 2.7 g/dL (ref 1.9–3.7)
Glucose, Bld: 76 mg/dL (ref 65–139)
Potassium: 3.7 mmol/L (ref 3.5–5.3)
Sodium: 141 mmol/L (ref 135–146)
Total Bilirubin: 0.4 mg/dL (ref 0.2–1.2)
Total Protein: 7 g/dL (ref 6.1–8.1)
eGFR: 73 mL/min/1.73m2 (ref >=60)

## 2024-07-05 LAB — CBC WITH DIFFERENTIAL/PLATELET
Absolute Lymphocytes: 1872 {cells}/uL (ref 850–3900)
Absolute Monocytes: 400 {cells}/uL (ref 200–950)
Basophils Absolute: 18 {cells}/uL (ref 0–200)
Basophils Relative: 0.4 %
Eosinophils Absolute: 92 {cells}/uL (ref 15–500)
Eosinophils Relative: 2 %
HCT: 39.7 % (ref 35.0–45.0)
Hemoglobin: 13.8 g/dL (ref 11.7–15.5)
MCH: 30.9 pg (ref 27.0–33.0)
MCHC: 34.8 g/dL (ref 32.0–36.0)
MCV: 89 fL (ref 80.0–100.0)
MPV: 9.8 fL (ref 7.5–12.5)
Monocytes Relative: 8.7 %
Neutro Abs: 2217 {cells}/uL (ref 1500–7800)
Neutrophils Relative %: 48.2 %
Platelets: 314 Thousand/uL (ref 140–400)
RBC: 4.46 Million/uL (ref 3.80–5.10)
RDW: 12.9 % (ref 11.0–15.0)
Total Lymphocyte: 40.7 %
WBC: 4.6 Thousand/uL (ref 3.8–10.8)

## 2024-07-07 ENCOUNTER — Other Ambulatory Visit: Payer: Self-pay | Admitting: Internal Medicine

## 2024-07-07 DIAGNOSIS — R928 Other abnormal and inconclusive findings on diagnostic imaging of breast: Secondary | ICD-10-CM

## 2024-07-26 ENCOUNTER — Ambulatory Visit

## 2024-07-26 ENCOUNTER — Ambulatory Visit
Admission: RE | Admit: 2024-07-26 | Discharge: 2024-07-26 | Disposition: A | Discharge status: Home / Self Care | Source: Ambulatory Visit | Attending: Internal Medicine | Admitting: Internal Medicine

## 2024-07-26 DIAGNOSIS — R928 Other abnormal and inconclusive findings on diagnostic imaging of breast: Secondary | ICD-10-CM

## 2024-08-03 NOTE — Progress Notes (Unsigned)
 Office Visit Note  Patient: Marie [name]             Date of Birth: Dec 20, 1980           MRN: [id number]             PCP: Ransom Other, MD Referring: Ransom Other, MD Visit Date: 08/17/2024 Occupation: Data Unavailable  Subjective:  Medication monitoring  History of Present Illness: [name] is a 43 y.o. female with history of seropositive rheumatoid arthritis.  Patient remains on  Plaquenil  200 mg 1 tablet twice daily Monday through Friday only.  She is tolerating Plaquenil  without any side effects and has not had any gaps in therapy.  Patient states that she has noticed increased soreness and stiffness in both elbows, both hands, and both knee joints which he attributes to having to sit and tight for prolonged peers of time for work.  She recently had to work 10-hour days for 6 days straight which she feels has contributed to the increased pain.  She denies any joint swelling.  She has not been taking any ibuprofen or over-the-counter products for pain relief.  She denies any recent or recurrent infections.  She denies any new medical conditions.   Activities of Daily Living:  Patient reports morning stiffness for 0 minutes  Patient Denies nocturnal pain.  Difficulty dressing/grooming: Denies Difficulty climbing stairs: Denies Difficulty getting out of chair: Denies Difficulty using hands for taps, buttons, cutlery, and/or writing: Denies  Review of Systems  Constitutional:  Negative for fatigue.  HENT:  Negative for mouth sores, mouth dryness and nose dryness.   Eyes:  Negative for pain, visual disturbance and dryness.  Respiratory:  Negative for cough, hemoptysis, shortness of breath and difficulty breathing.   Cardiovascular:  Negative for chest pain, palpitations, hypertension and swelling in legs/feet.  Gastrointestinal:  Negative for blood in stool, constipation and diarrhea.  Endocrine: Negative for increased urination.  Genitourinary:  Negative for painful  urination.  Musculoskeletal:  Positive for joint pain and joint pain. Negative for joint swelling, myalgias, muscle weakness, morning stiffness, muscle tenderness and myalgias.  Skin:  Negative for color change, pallor, rash, hair loss, nodules/bumps, skin tightness, ulcers and sensitivity to sunlight.  Allergic/Immunologic: Negative for susceptible to infections.  Neurological:  Negative for dizziness, numbness, headaches and weakness.  Hematological:  Negative for swollen glands.  Psychiatric/Behavioral:  Negative for depressed mood and sleep disturbance. The patient is not nervous/anxious.     PMFS History:  Patient Active Problem List   Diagnosis Date Noted   Seropositive rheumatoid arthritis of multiple sites (HCC) 11/16/2016   History of goiter 11/16/2016   Family history of sarcoidosis 11/16/2016   History of gastroesophageal reflux (GERD) 11/03/2016   Abnormal serum angiotensin-converting enzyme level,has had positive rheumatoid factor, and anti-DNAse B in the past.   11/03/2016   High serum angiotensin converting enzyme (ACE) 11/03/2016   Goiter 08/05/2016   Gastroesophageal reflux disease without esophagitis 08/05/2016    Past Medical History:  Diagnosis Date   Enlarged thyroid     no current med.   GERD (gastroesophageal reflux disease)    Hypertension    states under control with meds., has been on med. since 03/2017   Macromastia 06/2017   Rheumatoid arthritis (HCC)    Sleep apnea    no CPAP use but did bring CPAP machine with her   Vertigo     Family History  Problem Relation Age of Onset   Hypertension Mother  Arthritis Mother    Sarcoidosis Mother    Diabetes Other    Diabetes Maternal Grandmother    Congestive Heart Failure Maternal Grandmother    Cancer Maternal Grandfather    Past Surgical History:  Procedure Laterality Date   BREAST BIOPSY Left 07/18/2022   BREAST REDUCTION SURGERY Bilateral 07/06/2017   Procedure: MAMMARY REDUCTION  (BREAST);   Surgeon: Marcus Lung, MD;  Location: [facility];  Service: Plastics;  Laterality: Bilateral;   CHROMOPERTUBATION  05/30/2011   HYSTEROSCOPY DIAGNOSTIC  05/30/2011   LAPAROSCOPIC LYSIS OF ADHESIONS  05/30/2011   LAPAROSCOPIC UNILATERAL SALPINGECTOMY Right 09/19/2010   REDUCTION MAMMAPLASTY Bilateral 2018   Social History   Tobacco Use   Smoking status: Never    Passive exposure: Never   Smokeless tobacco: Never  Vaping Use   Vaping status: Never Used  Substance Use Topics   Alcohol use: No   Drug use: No   Social History   Social History Narrative   Not on file     Immunization History  Administered Date(s) Administered   PFIZER(Purple Top)SARS-COV-2 Vaccination 11/19/2019, 12/10/2019, 08/10/2020     Objective: Vital Signs: BP 130/87   Pulse 69   Temp 97.9 F (36.6 C)   Resp 14   Ht 5' 2 (1.575 m)   Wt 194 lb (88 kg)   BMI 35.48 kg/m    Physical Exam Vitals and nursing note reviewed.  Constitutional:      Appearance: She is well-developed.  HENT:     Head: Normocephalic and atraumatic.  Eyes:     Conjunctiva/sclera: Conjunctivae normal.  Cardiovascular:     Rate and Rhythm: Normal rate and regular rhythm.     Heart sounds: Normal heart sounds.  Pulmonary:     Effort: Pulmonary effort is normal.     Breath sounds: Normal breath sounds.  Abdominal:     General: Bowel sounds are normal.     Palpations: Abdomen is soft.  Musculoskeletal:     Cervical back: Normal range of motion.  Lymphadenopathy:     Cervical: No cervical adenopathy.  Skin:    General: Skin is warm and dry.     Capillary Refill: Capillary refill takes less than 2 seconds.  Neurological:     Mental Status: She is alert and oriented to person, place, and time.  Psychiatric:        Behavior: Behavior normal.      Musculoskeletal Exam: C-spine, thoracic spine, lumbar spine have good range of motion.  No midline spinal tenderness.  No SI joint tenderness.  Shoulder  joints, elbow joints, wrist joints, MCPs, PIPs, DIPs have good range of motion with no synovitis.  Tenderness along the elbow joint line bilaterally.  Complete fist formation bilaterally.  Hip joints have good range of motion with no groin pain.  Knee joints have good range of motion no warmth or effusion.  Mild discomfort and stiffness with range of motion of both knees.  Ankle joints have good range of motion no tenderness or joint swelling.  No evidence of Achilles tendinitis or plantar fasciitis.   CDAI Exam: CDAI Score: -- Patient Global: --; Provider Global: -- Swollen: --; Tender: -- Joint Exam 08/17/2024   No joint exam has been documented for this visit   There is currently no information documented on the homunculus. Go to the Rheumatology activity and complete the homunculus joint exam.  Investigation: No additional findings.  Imaging: MM 3D DIAGNOSTIC MAMMOGRAM UNILATERAL RIGHT BREAST Result Date: 07/26/2024 CLINICAL  DATA:  43 year old female recalled for a possible right breast asymmetry. EXAM: DIGITAL DIAGNOSTIC UNILATERAL RIGHT MAMMOGRAM WITH TOMOSYNTHESIS AND CAD TECHNIQUE: Right digital diagnostic mammography and breast tomosynthesis was performed. The images were evaluated with computer-aided detection. Additional 2 and 3D spot compression views were obtained. COMPARISON:  Previous exam(s). ACR Breast Density Category b: There are scattered areas of fibroglandular density. FINDINGS: Right Mammogram: The possible asymmetry identified on the recent screening mammogram resolves with spot compression views which is consistent with superimposed fibroglandular tissue. No findings suspicious for malignancy. IMPRESSION: No mammographic evidence of malignancy. RECOMMENDATION: Return to annual screening mammograms. I have discussed the findings and recommendations with the patient. If applicable, a reminder letter will be sent to the patient regarding the next appointment. BI-RADS CATEGORY   1: Negative. Electronically Signed   By: Curtistine Noble   On: 07/26/2024 15:06    Recent Labs: Lab Results  Component Value Date   WBC 4.6 07/04/2024   HGB 13.8 07/04/2024   PLT 314 07/04/2024   NA 141 07/04/2024   K 3.7 07/04/2024   CL 102 07/04/2024   CO2 30 07/04/2024   GLUCOSE 76 07/04/2024   BUN 9 07/04/2024   CREATININE 0.98 07/04/2024   BILITOT 0.4 07/04/2024   ALKPHOS 72 04/16/2017   AST 12 07/04/2024   ALT 12 07/04/2024   PROT 7.0 07/04/2024   ALBUMIN 4.4 04/16/2017   CALCIUM 9.6 07/04/2024   GFRAA 127 12/26/2020    Speciality Comments: PLQ eye exam: 05/04/2024 WNL Dr. Patrcia. Follow up 1 year.  Procedures:  No procedures performed Allergies: Iodine, Red dye #40 (allura red), Shellfish allergy , Latex, and Sulfa antibiotics   Assessment / Plan:     Visit Diagnoses: Seropositive rheumatoid arthritis of multiple sites (HCC) - RF negative, anti-CCP positive, positive synovitis on ultrasound: She has no synovitis on examination today.  She has not had any signs or symptoms of a rheumatoid arthritis flare.  She has clinically been doing well taking Plaquenil  200 mg 1 tablet by mouth twice daily Monday through Friday.  She is tolerating Plaquenil  without any side effects and has not had any gaps in therapy.  She has noticed some increased soreness and stiffness affecting both elbows, both hands, and both knee joints.  No inflammation was noted on examination today.  She uses arthritis compression gloves as needed for hand pain relief which has been helpful.  Discussed use of Voltaren gel which she can apply topically as needed for pain relief.  She will remain on Plaquenil  as monotherapy.  She is vies notify us  if she develops any new or worsening symptoms.  She will follow-up in the office in 5 months or sooner if needed.  High risk medication use - Plaquenil  200 mg 1 tablet twice daily Monday through Friday only.  CBC and CMP WNL on 07/04/24.  She will continue to require  updated lab work every 5 months. PLQ eye exam: 05/04/2024 WNL Dr. Patrcia. Follow up 1 year.    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  She uses arthritis compression gloves as needed.  Discussed the use of Voltaren gel which she will buy topically as needed for pain relief.  There is no synovitis on examination today.  Lateral epicondylitis of both elbows: No tenderness over the lateral epicondyle at this time.  She has some tenderness along the elbow joint line which he attributes  to having to sit for prolonged periods of time while typing for work.  Discussed the use of Voltaren gel which he can apply topically as needed for symptomatic relief.  Chronic pain of both knees: She is experiencing increased discomfort and stiffness in both knee joints.  She experiences some stiffness when rising from a seated position.  No warmth or effusion noted.  Pain in both feet: She is not experiencing any increased discomfort in her feet at this time.  She is good range of motion of both ankle joints with no tenderness or joint swelling.  Other medical conditions are listed as follows:   History of goiter  History of gastroesophageal reflux (GERD)  Family history of sarcoidosis  Orders: No orders of the defined types were placed in this encounter.  No orders of the defined types were placed in this encounter.   Follow-Up Instructions: Return in 5 months (on 01/15/2025) for Rheumatoid arthritis.   Waddell CHRISTELLA Craze, PA-C  Note - This record has been created using Dragon software.  Chart creation errors have been sought, but may not always  have been located. Such creation errors do not reflect on  the standard of medical care.

## 2024-08-04 ENCOUNTER — Other Ambulatory Visit: Payer: Self-pay | Admitting: Physician Assistant

## 2024-08-04 DIAGNOSIS — M0579 Rheumatoid arthritis with rheumatoid factor of multiple sites without organ or systems involvement: Secondary | ICD-10-CM

## 2024-08-04 NOTE — Telephone Encounter (Signed)
 Last Fill: 06/20/2024  Eye exam: 05/04/2024 WNL   Labs: 07/04/2024 WNL  Next Visit: 08/17/2024  Last Visit: 03/17/2024  DX: Seropositive rheumatoid arthritis of multiple sites [facility])   Current Dose per office note 03/17/2024: Plaquenil  200 mg 1 tablet twice daily Monday through Friday only.   Okay to refill Plaquenil ?

## 2024-08-17 ENCOUNTER — Ambulatory Visit: Attending: Physician Assistant | Admitting: Physician Assistant

## 2024-08-17 ENCOUNTER — Encounter: Payer: Self-pay | Admitting: Physician Assistant

## 2024-08-17 VITALS — BP 130/87 | HR 69 | Temp 97.9°F | Resp 14 | Ht 62.0 in | Wt 194.0 lb

## 2024-08-17 DIAGNOSIS — M7712 Lateral epicondylitis, left elbow: Secondary | ICD-10-CM

## 2024-08-17 DIAGNOSIS — G8929 Other chronic pain: Secondary | ICD-10-CM

## 2024-08-17 DIAGNOSIS — Z8639 Personal history of other endocrine, nutritional and metabolic disease: Secondary | ICD-10-CM

## 2024-08-17 DIAGNOSIS — M0579 Rheumatoid arthritis with rheumatoid factor of multiple sites without organ or systems involvement: Secondary | ICD-10-CM

## 2024-08-17 DIAGNOSIS — M7711 Lateral epicondylitis, right elbow: Secondary | ICD-10-CM | POA: Diagnosis not present

## 2024-08-17 DIAGNOSIS — M79641 Pain in right hand: Secondary | ICD-10-CM

## 2024-08-17 DIAGNOSIS — M79671 Pain in right foot: Secondary | ICD-10-CM

## 2024-08-17 DIAGNOSIS — Z79899 Other long term (current) drug therapy: Secondary | ICD-10-CM

## 2024-08-17 DIAGNOSIS — M25562 Pain in left knee: Secondary | ICD-10-CM

## 2024-08-17 DIAGNOSIS — M79672 Pain in left foot: Secondary | ICD-10-CM

## 2024-08-17 DIAGNOSIS — Z832 Family history of diseases of the blood and blood-forming organs and certain disorders involving the immune mechanism: Secondary | ICD-10-CM

## 2024-08-17 DIAGNOSIS — M79642 Pain in left hand: Secondary | ICD-10-CM

## 2024-08-17 DIAGNOSIS — Z8719 Personal history of other diseases of the digestive system: Secondary | ICD-10-CM

## 2024-08-17 DIAGNOSIS — M25561 Pain in right knee: Secondary | ICD-10-CM

## 2024-08-17 NOTE — Patient Instructions (Signed)
 Voltaren gel (diclofenac)

## 2024-10-13 ENCOUNTER — Other Ambulatory Visit: Payer: Self-pay | Admitting: Nurse Practitioner

## 2024-10-13 DIAGNOSIS — Z86018 Personal history of other benign neoplasm: Secondary | ICD-10-CM

## 2024-10-13 DIAGNOSIS — N852 Hypertrophy of uterus: Secondary | ICD-10-CM

## 2024-10-26 ENCOUNTER — Ambulatory Visit
Admission: RE | Admit: 2024-10-26 | Discharge: 2024-10-26 | Disposition: A | Source: Ambulatory Visit | Attending: Nurse Practitioner | Admitting: Nurse Practitioner

## 2024-10-26 DIAGNOSIS — N852 Hypertrophy of uterus: Secondary | ICD-10-CM

## 2024-10-26 DIAGNOSIS — Z86018 Personal history of other benign neoplasm: Secondary | ICD-10-CM

## 2025-01-17 ENCOUNTER — Ambulatory Visit: Admitting: Physician Assistant
